# Patient Record
Sex: Female | Born: 1940 | Race: White | Hispanic: No | Marital: Married | Smoking: Never smoker
Health system: Southern US, Community
[De-identification: ages and names within clinical notes are randomized; demographics above are authoritative.]

## PROBLEM LIST (undated history)

## (undated) DIAGNOSIS — N3020 Other chronic cystitis without hematuria: Secondary | ICD-10-CM

## (undated) DIAGNOSIS — G3184 Mild cognitive impairment, so stated: Secondary | ICD-10-CM

## (undated) DIAGNOSIS — E785 Hyperlipidemia, unspecified: Secondary | ICD-10-CM

## (undated) DIAGNOSIS — D649 Anemia, unspecified: Secondary | ICD-10-CM

## (undated) DIAGNOSIS — K635 Polyp of colon: Secondary | ICD-10-CM

## (undated) DIAGNOSIS — I10 Essential (primary) hypertension: Secondary | ICD-10-CM

## (undated) DIAGNOSIS — G459 Transient cerebral ischemic attack, unspecified: Secondary | ICD-10-CM

## (undated) DIAGNOSIS — J309 Allergic rhinitis, unspecified: Secondary | ICD-10-CM

## (undated) DIAGNOSIS — M1990 Unspecified osteoarthritis, unspecified site: Secondary | ICD-10-CM

## (undated) DIAGNOSIS — M8580 Other specified disorders of bone density and structure, unspecified site: Secondary | ICD-10-CM

## (undated) DIAGNOSIS — F0390 Unspecified dementia without behavioral disturbance: Secondary | ICD-10-CM

## (undated) DIAGNOSIS — N390 Urinary tract infection, site not specified: Secondary | ICD-10-CM

## (undated) DIAGNOSIS — K219 Gastro-esophageal reflux disease without esophagitis: Secondary | ICD-10-CM

## (undated) DIAGNOSIS — M47812 Spondylosis without myelopathy or radiculopathy, cervical region: Secondary | ICD-10-CM

## (undated) DIAGNOSIS — G5601 Carpal tunnel syndrome, right upper limb: Secondary | ICD-10-CM

## (undated) DIAGNOSIS — F319 Bipolar disorder, unspecified: Secondary | ICD-10-CM

## (undated) HISTORY — PX: POLYPECTOMY: SHX149

## (undated) HISTORY — PX: BREAST BIOPSY: SHX20

## (undated) HISTORY — PX: BLADDER SUSPENSION: SHX72

## (undated) HISTORY — DX: Allergic rhinitis, unspecified: J30.9

## (undated) HISTORY — DX: Hyperlipidemia, unspecified: E78.5

## (undated) HISTORY — DX: Gastro-esophageal reflux disease without esophagitis: K21.9

## (undated) HISTORY — DX: Anemia, unspecified: D64.9

## (undated) HISTORY — PX: CHOLECYSTECTOMY: SHX55

## (undated) HISTORY — DX: Bipolar disorder, unspecified: F31.9

## (undated) HISTORY — PX: BUNIONECTOMY: SHX129

## (undated) HISTORY — DX: Transient cerebral ischemic attack, unspecified: G45.9

## (undated) HISTORY — PX: COLONOSCOPY: SHX174

## (undated) HISTORY — DX: Urinary tract infection, site not specified: N39.0

## (undated) HISTORY — PX: OTHER SURGICAL HISTORY: SHX169

## (undated) HISTORY — DX: Essential (primary) hypertension: I10

## (undated) HISTORY — DX: Unspecified osteoarthritis, unspecified site: M19.90

## (undated) HISTORY — DX: Other chronic cystitis without hematuria: N30.20

## (undated) HISTORY — DX: Carpal tunnel syndrome, right upper limb: G56.01

## (undated) HISTORY — DX: Spondylosis without myelopathy or radiculopathy, cervical region: M47.812

## (undated) HISTORY — DX: Other specified disorders of bone density and structure, unspecified site: M85.80

## (undated) HISTORY — DX: Mild cognitive impairment, so stated: G31.84

## (undated) HISTORY — PX: TOTAL ABDOMINAL HYSTERECTOMY: SHX209

## (undated) HISTORY — PX: TONSILLECTOMY: SUR1361

## (undated) HISTORY — PX: TRIGGER FINGER RELEASE: SHX641

## (undated) HISTORY — PX: BREAST LUMPECTOMY: SHX2

## (undated) HISTORY — DX: Polyp of colon: K63.5

---

## 2011-04-24 ENCOUNTER — Emergency Department (HOSPITAL_COMMUNITY)
Admission: EM | Admit: 2011-04-24 | Discharge: 2011-04-24 | Disposition: A | Discharge status: Home / Self Care | Payer: Medicare Other | Attending: Emergency Medicine | Admitting: Emergency Medicine

## 2011-04-24 ENCOUNTER — Emergency Department (HOSPITAL_COMMUNITY): Payer: Medicare Other

## 2011-04-24 DIAGNOSIS — R059 Cough, unspecified: Secondary | ICD-10-CM | POA: Insufficient documentation

## 2011-04-24 DIAGNOSIS — F319 Bipolar disorder, unspecified: Secondary | ICD-10-CM | POA: Insufficient documentation

## 2011-04-24 DIAGNOSIS — R05 Cough: Secondary | ICD-10-CM | POA: Insufficient documentation

## 2011-04-24 DIAGNOSIS — I509 Heart failure, unspecified: Secondary | ICD-10-CM | POA: Insufficient documentation

## 2011-04-24 DIAGNOSIS — J45909 Unspecified asthma, uncomplicated: Secondary | ICD-10-CM | POA: Insufficient documentation

## 2011-04-24 DIAGNOSIS — I10 Essential (primary) hypertension: Secondary | ICD-10-CM | POA: Insufficient documentation

## 2011-04-24 DIAGNOSIS — J40 Bronchitis, not specified as acute or chronic: Secondary | ICD-10-CM | POA: Insufficient documentation

## 2011-04-27 ENCOUNTER — Ambulatory Visit (INDEPENDENT_AMBULATORY_CARE_PROVIDER_SITE_OTHER): Payer: Medicare Other | Admitting: Internal Medicine

## 2011-04-27 ENCOUNTER — Other Ambulatory Visit (INDEPENDENT_AMBULATORY_CARE_PROVIDER_SITE_OTHER): Payer: Medicare Other

## 2011-04-27 ENCOUNTER — Encounter: Payer: Self-pay | Admitting: Internal Medicine

## 2011-04-27 ENCOUNTER — Other Ambulatory Visit (INDEPENDENT_AMBULATORY_CARE_PROVIDER_SITE_OTHER): Payer: Medicare Other | Admitting: Internal Medicine

## 2011-04-27 VITALS — BP 122/70 | HR 71 | Temp 98.8°F | Ht <= 58 in | Wt 136.5 lb

## 2011-04-27 DIAGNOSIS — K219 Gastro-esophageal reflux disease without esophagitis: Secondary | ICD-10-CM

## 2011-04-27 DIAGNOSIS — E785 Hyperlipidemia, unspecified: Secondary | ICD-10-CM

## 2011-04-27 DIAGNOSIS — R5381 Other malaise: Secondary | ICD-10-CM

## 2011-04-27 DIAGNOSIS — N390 Urinary tract infection, site not specified: Secondary | ICD-10-CM

## 2011-04-27 DIAGNOSIS — J42 Unspecified chronic bronchitis: Secondary | ICD-10-CM | POA: Insufficient documentation

## 2011-04-27 DIAGNOSIS — Z13220 Encounter for screening for lipoid disorders: Secondary | ICD-10-CM

## 2011-04-27 DIAGNOSIS — R5383 Other fatigue: Secondary | ICD-10-CM

## 2011-04-27 DIAGNOSIS — J309 Allergic rhinitis, unspecified: Secondary | ICD-10-CM | POA: Insufficient documentation

## 2011-04-27 DIAGNOSIS — M1990 Unspecified osteoarthritis, unspecified site: Secondary | ICD-10-CM | POA: Insufficient documentation

## 2011-04-27 DIAGNOSIS — K635 Polyp of colon: Secondary | ICD-10-CM

## 2011-04-27 DIAGNOSIS — Z0000 Encounter for general adult medical examination without abnormal findings: Secondary | ICD-10-CM | POA: Insufficient documentation

## 2011-04-27 DIAGNOSIS — N3020 Other chronic cystitis without hematuria: Secondary | ICD-10-CM | POA: Insufficient documentation

## 2011-04-27 DIAGNOSIS — Z90710 Acquired absence of both cervix and uterus: Secondary | ICD-10-CM | POA: Insufficient documentation

## 2011-04-27 DIAGNOSIS — G459 Transient cerebral ischemic attack, unspecified: Secondary | ICD-10-CM

## 2011-04-27 DIAGNOSIS — D126 Benign neoplasm of colon, unspecified: Secondary | ICD-10-CM

## 2011-04-27 DIAGNOSIS — J4520 Mild intermittent asthma, uncomplicated: Secondary | ICD-10-CM | POA: Insufficient documentation

## 2011-04-27 DIAGNOSIS — R32 Unspecified urinary incontinence: Secondary | ICD-10-CM | POA: Insufficient documentation

## 2011-04-27 DIAGNOSIS — F319 Bipolar disorder, unspecified: Secondary | ICD-10-CM

## 2011-04-27 DIAGNOSIS — I10 Essential (primary) hypertension: Secondary | ICD-10-CM

## 2011-04-27 DIAGNOSIS — D649 Anemia, unspecified: Secondary | ICD-10-CM

## 2011-04-27 HISTORY — DX: Gastro-esophageal reflux disease without esophagitis: K21.9

## 2011-04-27 HISTORY — DX: Unspecified osteoarthritis, unspecified site: M19.90

## 2011-04-27 HISTORY — DX: Other chronic cystitis without hematuria: N30.20

## 2011-04-27 HISTORY — DX: Allergic rhinitis, unspecified: J30.9

## 2011-04-27 HISTORY — DX: Urinary tract infection, site not specified: N39.0

## 2011-04-27 HISTORY — DX: Bipolar disorder, unspecified: F31.9

## 2011-04-27 HISTORY — DX: Transient cerebral ischemic attack, unspecified: G45.9

## 2011-04-27 HISTORY — DX: Hyperlipidemia, unspecified: E78.5

## 2011-04-27 HISTORY — DX: Essential (primary) hypertension: I10

## 2011-04-27 HISTORY — DX: Anemia, unspecified: D64.9

## 2011-04-27 HISTORY — DX: Polyp of colon: K63.5

## 2011-04-27 LAB — HEPATIC FUNCTION PANEL
AST: 18 U/L (ref 0–37)
Albumin: 3.8 g/dL (ref 3.5–5.2)
Alkaline Phosphatase: 59 U/L (ref 39–117)
Bilirubin, Direct: 0.1 mg/dL (ref 0.0–0.3)

## 2011-04-27 LAB — BASIC METABOLIC PANEL
CO2: 27 mEq/L (ref 19–32)
GFR: 72.95 mL/min (ref >60.00)
Glucose, Bld: 91 mg/dL (ref 70–99)
Potassium: 4.2 mEq/L (ref 3.5–5.1)
Sodium: 139 mEq/L (ref 135–145)

## 2011-04-27 LAB — CBC WITH DIFFERENTIAL/PLATELET
Basophils Absolute: 0 10*3/uL (ref 0.0–0.1)
Eosinophils Absolute: 0.2 10*3/uL (ref 0.0–0.7)
HCT: 38.6 % (ref 36.0–46.0)
Lymphs Abs: 2.8 10*3/uL (ref 0.7–4.0)
MCHC: 33.5 g/dL (ref 30.0–36.0)
Monocytes Absolute: 0.6 10*3/uL (ref 0.1–1.0)
Monocytes Relative: 6.7 % (ref 3.0–12.0)
Platelets: 219 10*3/uL (ref 150.0–400.0)
RDW: 13.1 % (ref 11.5–14.6)

## 2011-04-27 LAB — LIPID PANEL
Total CHOL/HDL Ratio: 3
Triglycerides: 263 mg/dL — ABNORMAL HIGH (ref 0.0–149.0)

## 2011-04-27 LAB — LDL CHOLESTEROL, DIRECT: Direct LDL: 99 mg/dL

## 2011-04-27 LAB — TSH: TSH: 1.69 u[IU]/mL (ref 0.35–5.50)

## 2011-04-27 NOTE — Assessment & Plan Note (Signed)
Due for f/u colonscopy - will refer

## 2011-04-27 NOTE — Patient Instructions (Signed)
Continue all other medications as before You will be contacted regarding the referral for: colonoscopy Please call [facility] on [address] for your routine Screening Mammogram Please call if you need refills in the future Please go to LAB in the Basement for the blood and/or urine tests to be done today Please call the phone number [phone] (the PhoneTree System) for results of testing in 2-3 days;  When calling, simply dial the number, and when prompted enter the MRN number above (the Medical Record Number) and the # key, then the message should start. Please return in 1 year for your yearly visit, or sooner if needed

## 2011-04-27 NOTE — Progress Notes (Signed)
Subjective:    Patient ID: Madeline Mcdaniel, female    DOB: 02-18-1941, 70 y.o.   MRN: [id number]  HPI  Here to establish as new pt, just seen in  ER may 22 , dx and tx for bronchitis, cxr neg, still coughing "when I get hot"  And to lie down, overall less prod, no blood, Here for wellness and f/u;  Overall doing ok;  Pt denies CP, worsening SOB, DOE, wheezing, orthopnea, PND, worsening LE edema, palpitations, dizziness or syncope.  Pt denies neurological change such as new Headache, facial or extremity weakness.  Pt denies polydipsia, polyuria, or low sugar symptoms. Pt states overall good compliance with treatment and medications, good tolerability, and trying to follow lower cholesterol diet.  Pt denies worsening depressive symptoms, suicidal ideation or panic. No fever, wt loss, night sweats, loss of appetite, or other constitutional symptoms.  Pt states good ability with ADL's, low fall risk, home safety reviewed and adequate, no significant changes in hearing or vision, and occasionally active with exercise.   Does have sense of ongoing fatigue, but denies signficant hypersomnolence.  Past Medical History  Diagnosis Date  . Asthma 04/27/2011  . Degenerative joint disease 04/27/2011  . Bipolar affective disorder 04/27/2011  . Chronic bronchitis 04/27/2011  . GERD (gastroesophageal reflux disease) 04/27/2011  . Allergic rhinitis, cause unspecified 04/27/2011  . HTN (hypertension) 04/27/2011  . Hyperlipemia 04/27/2011  . Chronic cystitis 04/27/2011  . Colon polyps 04/27/2011  . Anemia 04/27/2011  . H/O: hysterectomy 04/27/2011  . TIA (transient ischemic attack) 04/27/2011  . Urinary incontinence 04/27/2011  . 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 Hyperlipidemia 04/27/2011   No past surgical history on file.  reports that she has quit smoking. She does not have any smokeless tobacco history on file. She reports that she does not drink alcohol or use illicit drugs. family history is not on file. Allergies  Allergen  Reactions  . Vioxx (Rofecoxib)   Oliver Barre No current outpatient prescriptions on file prior to visit.   Review of Systems Review of Systems  Constitutional: Negative for diaphoresis, activity change, appetite change and unexpected weight change.  HENT: Negative for hearing loss, ear pain, facial swelling, mouth sores and neck stiffness.   Eyes: Negative for pain, redness and visual disturbance.  Respiratory: Negative for shortness of breath and wheezing.   Cardiovascular: Negative for chest pain and palpitations.  Gastrointestinal: Negative for diarrhea, blood in stool, abdominal distention and rectal pain.  Genitourinary: Negative for hematuria, flank pain and decreased urine volume.  Musculoskeletal: Negative for myalgias and joint swelling.  Skin: Negative for color change and wound.  Neurological: Negative for syncope and numbness.  Hematological: Negative for adenopathy.  Psychiatric/Behavioral: Negative for hallucinations, self-injury, decreased concentration and agitation.      Objective:   Physical Exam BP 122/70  Pulse 71  Temp(Src) 98.8 F (37.1 C) (Oral)  Ht 4\' 9"  (1.448 m)  Wt 136 lb 8 oz (61.916 kg)  BMI 29.54 kg/m2  SpO2 96% Physical Exam  VS noted Constitutional: Pt is oriented to person, place, and time. Appears well-developed and well-nourished.  HENT:  Head: Normocephalic and atraumatic.  Right Ear: External ear normal.  Left Ear: External ear normal.  Nose: Nose normal.  Mouth/Throat: Oropharynx is clear and moist.  Eyes: Conjunctivae and EOM are normal. Pupils are equal, round, and reactive to light.  Neck: Normal range of motion. Neck supple. No JVD present. No tracheal deviation present.  Cardiovascular: Normal rate, regular rhythm, normal heart sounds  and intact distal pulses.   Pulmonary/Chest: Effort normal and breath sounds normal.  Abdominal: Soft. Bowel sounds are normal. There is no tenderness.  Musculoskeletal: Normal range of motion.  Exhibits no edema.  Lymphadenopathy:  Has no cervical adenopathy.  Neurological: Pt is alert and oriented to person, place, and time. Pt has normal reflexes. No cranial nerve deficit.  Skin: Skin is warm and dry. No rash noted.  Psychiatric:  Has  normal mood and affect. Behavior is normal.         Assessment & Plan:

## 2011-04-27 NOTE — Assessment & Plan Note (Signed)
.  stable overall by hx and exam, most recent lab reviewed with pt, and pt to continue medical treatment as before , cont diet  For lipid check today

## 2011-04-27 NOTE — Assessment & Plan Note (Signed)
Etiology unclear, Exam otherwise benign, to check labs as documented, follow with expectant management  

## 2011-04-27 NOTE — Assessment & Plan Note (Signed)
stable overall by hx and exam, most recent lab reviewed with pt, and pt to continue medical treatment as before  For lithium level today

## 2011-04-28 NOTE — Progress Notes (Signed)
Quick Note:  Voice message left on PhoneTree system - lab is negative, normal or otherwise stable, pt to continue same tx ______ 

## 2011-05-07 ENCOUNTER — Encounter: Payer: Self-pay | Admitting: Internal Medicine

## 2011-05-18 ENCOUNTER — Ambulatory Visit (AMBULATORY_SURGERY_CENTER): Payer: Medicare Other | Admitting: *Deleted

## 2011-05-18 VITALS — Ht <= 58 in | Wt 135.5 lb

## 2011-05-18 DIAGNOSIS — Z1211 Encounter for screening for malignant neoplasm of colon: Secondary | ICD-10-CM

## 2011-05-18 MED ORDER — PEG-KCL-NACL-NASULF-NA ASC-C 100 G PO SOLR: ORAL | Status: DC

## 2011-05-18 NOTE — Progress Notes (Signed)
PATIENT HAD PREVISIT TODAY FOR DIRECT SCREENING COLONOSCOPY. SHE STATES LAST COLONOSCOPY WAS 11YRS AGO IN [place],PT UNSURE OF DOCTORS NAME. SHE SAID DR REMOVED 2 SMALL BENIGN POLYPS AND WANT HER TO REPEAT THIS IN 10 YRS. SHE DENIES ANT SYMPTOMS OR PROBLEMS WITH COLON. SHE DOES C/O ACID REFLUX,EXPLAINING WHAT HAPPEN TO HER ABOUT MONTH AGO WITH REFLUX SYMPTOMS AT NIGHT AND SHE WAS UNABLE TO SLEEP. WHEN I ASKED HER SHE SAID SHE DID TELL HER PRIMARY MD,DR.JOHN ABOUT THIS. EXPLAINED TO HER THAT WE WOULD NEED TO MAKE HER A DOCTOR VISIT TO SPEAK WITH DR.PERRY ABOUT REFLUX AND THEN HE MAY DECIDE SHE WOULD NEED UPPER ENDO. SHE UNDERSTANDS BUT STATES SHE WOULD LIKE TO GET COLONOSCOPY OVER AND THEN SHE WOULD CALL TO MAKE THAT OFFICE VISIT. Madeline Mcdaniel

## 2011-05-18 NOTE — Progress Notes (Signed)
PATIENT STATES SHE IS HARD TO WAKE UP FROM ANESTHESIA, BUT STATES WHEN SHE TELLS ANESTHESIOLOGIST THIS BEFORE TIME,SURGERY GOES WELL.  PATIENT STATES SHE DOES NOT REMEMBER HAVING ANY PROBLEMS FROM LAST COLONOSCOPY 10 YRS AGO.

## 2011-06-01 ENCOUNTER — Ambulatory Visit (AMBULATORY_SURGERY_CENTER): Payer: Medicare Other | Admitting: Internal Medicine

## 2011-06-01 ENCOUNTER — Encounter: Payer: Self-pay | Admitting: Internal Medicine

## 2011-06-01 VITALS — BP 133/61 | HR 62 | Temp 97.7°F | Resp 21 | Ht <= 58 in | Wt 136.0 lb

## 2011-06-01 DIAGNOSIS — Z86010 Personal history of colonic polyps: Secondary | ICD-10-CM

## 2011-06-01 DIAGNOSIS — D126 Benign neoplasm of colon, unspecified: Secondary | ICD-10-CM

## 2011-06-01 DIAGNOSIS — K620 Anal polyp: Secondary | ICD-10-CM

## 2011-06-01 DIAGNOSIS — Z1211 Encounter for screening for malignant neoplasm of colon: Secondary | ICD-10-CM

## 2011-06-01 DIAGNOSIS — K648 Other hemorrhoids: Secondary | ICD-10-CM

## 2011-06-01 MED ORDER — SODIUM CHLORIDE 0.9 % IV SOLN: 500.0000 mL | INTRAVENOUS | Status: DC

## 2011-06-01 NOTE — Patient Instructions (Signed)
DISCHARGE INSTRUCTIONS GIVEN WITH VERBAL UNDERSTANDING. HANDOUT ON POLYPS AND DIVERTICULOSIS, HEMORRHOIDS GIVEN. RESUME PREVIOUS MEDICATIONS.

## 2011-06-01 NOTE — Progress Notes (Signed)
Pt tolerated the colonoscopy very well. MAW

## 2011-06-04 ENCOUNTER — Telehealth: Payer: Self-pay

## 2011-06-04 NOTE — Telephone Encounter (Signed)

## 2011-06-13 ENCOUNTER — Other Ambulatory Visit: Payer: Self-pay | Admitting: Internal Medicine

## 2011-06-13 DIAGNOSIS — Z1231 Encounter for screening mammogram for malignant neoplasm of breast: Secondary | ICD-10-CM

## 2011-07-05 ENCOUNTER — Ambulatory Visit
Admission: RE | Admit: 2011-07-05 | Discharge: 2011-07-05 | Disposition: A | Discharge status: Home / Self Care | Payer: Medicare Other | Source: Ambulatory Visit | Attending: Internal Medicine | Admitting: Internal Medicine

## 2011-07-05 DIAGNOSIS — Z1231 Encounter for screening mammogram for malignant neoplasm of breast: Secondary | ICD-10-CM

## 2011-07-09 ENCOUNTER — Other Ambulatory Visit: Payer: Self-pay | Admitting: Internal Medicine

## 2011-07-09 DIAGNOSIS — R928 Other abnormal and inconclusive findings on diagnostic imaging of breast: Secondary | ICD-10-CM

## 2011-07-13 ENCOUNTER — Ambulatory Visit
Admission: RE | Admit: 2011-07-13 | Discharge: 2011-07-13 | Disposition: A | Discharge status: Home / Self Care | Payer: Medicare Other | Source: Ambulatory Visit | Attending: Internal Medicine | Admitting: Internal Medicine

## 2011-07-13 DIAGNOSIS — R928 Other abnormal and inconclusive findings on diagnostic imaging of breast: Secondary | ICD-10-CM

## 2011-08-30 ENCOUNTER — Ambulatory Visit (INDEPENDENT_AMBULATORY_CARE_PROVIDER_SITE_OTHER): Payer: Medicare Other | Admitting: *Deleted

## 2011-08-30 DIAGNOSIS — Z23 Encounter for immunization: Secondary | ICD-10-CM

## 2011-09-04 ENCOUNTER — Other Ambulatory Visit: Payer: Self-pay | Admitting: Internal Medicine

## 2011-09-04 NOTE — Telephone Encounter (Signed)
Ok for all 3 as per routine  To robin to handle

## 2012-01-23 ENCOUNTER — Ambulatory Visit (INDEPENDENT_AMBULATORY_CARE_PROVIDER_SITE_OTHER): Payer: Medicare Other | Admitting: Internal Medicine

## 2012-01-23 ENCOUNTER — Ambulatory Visit (INDEPENDENT_AMBULATORY_CARE_PROVIDER_SITE_OTHER)
Admission: RE | Admit: 2012-01-23 | Discharge: 2012-01-23 | Disposition: A | Discharge status: Home / Self Care | Payer: Medicare Other | Source: Ambulatory Visit | Attending: Internal Medicine | Admitting: Internal Medicine

## 2012-01-23 ENCOUNTER — Encounter: Payer: Self-pay | Admitting: Internal Medicine

## 2012-01-23 VITALS — BP 120/82 | HR 68 | Temp 97.0°F | Ht <= 58 in | Wt 135.4 lb

## 2012-01-23 DIAGNOSIS — M79672 Pain in left foot: Secondary | ICD-10-CM

## 2012-01-23 DIAGNOSIS — I10 Essential (primary) hypertension: Secondary | ICD-10-CM

## 2012-01-23 DIAGNOSIS — M79676 Pain in unspecified toe(s): Secondary | ICD-10-CM

## 2012-01-23 DIAGNOSIS — M79609 Pain in unspecified limb: Secondary | ICD-10-CM

## 2012-01-23 NOTE — Assessment & Plan Note (Signed)
stable overall by hx and exam, most recent data reviewed with pt, and pt to continue medical treatment as before ble BP Readings from Last 3 Encounters:  01/23/12 120/82  06/01/11 133/61  04/27/11 122/70

## 2012-01-23 NOTE — Progress Notes (Signed)
Subjective:    Patient ID: Madeline Mcdaniel, female    DOB: Jul 21, 1941, 71 y.o.   MRN: [id number]  HPI here as acute walkin;  Night before last was up to BR without the light on, struck the left foot to the heavy bedpost with immed severe pain that persists, limps to walk and now with right lower back ache from that;  Also with marked sweling and bruising involving the distal and lateral foot , not on blood thinner.  No other falls or injury, no fever, redness.   Past Medical History  Diagnosis Date  . Asthma 04/27/2011  . Degenerative joint disease 04/27/2011  . Bipolar affective disorder 04/27/2011  . Chronic bronchitis 04/27/2011  . GERD (gastroesophageal reflux disease) 04/27/2011  . Allergic rhinitis, cause unspecified 04/27/2011  . HTN (hypertension) 04/27/2011  . Hyperlipemia 04/27/2011  . Chronic cystitis 04/27/2011  . Colon polyps 04/27/2011  . Anemia 04/27/2011  . H/O: hysterectomy 04/27/2011  . TIA (transient ischemic attack) 04/27/2011  . Urinary incontinence 04/27/2011  . 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 Hyperlipidemia 04/27/2011  . Stroke    Past Surgical History  Procedure Date  . Colonoscopy 76YRS AGO    IN [place],PT DOES NOT REMEMBER DOCTOR  . Orthoscopic carpetomy     BOTH THUMBS  . Trigger finger release     LEFT RING FINGER  . Bunionectomy     LEFT FOOT  . Total abdominal hysterectomy   . Tonsillectomy   . Bladder suspension     A&P REPAIR WITH MESH  . Steriotactic breast biopsy     RIGHT  . Breast lumpectomy     SEVERAL TIMES  . Polypectomy 10 YRS AGO    BENIGN POLYPS X2    reports that she has never smoked. She has never used smokeless tobacco. She reports that she does not drink alcohol or use illicit drugs. family history includes Cancer (age of onset:62) in her mother.  There is no history of Colon cancer. Allergies  Allergen Reactions  . Vioxx (Rofecoxib) Anaphylaxis  . Aspirin   . Ciprofloxacin Other (See Comments)    HEADACHE  . Nsaids     BECAUSE OF REACTION  TO VIOXX   Current Outpatient Prescriptions on File Prior to Visit  Medication Sig Dispense Refill  . acetaminophen (TYLENOL) 500 MG tablet Take 500 mg by mouth as needed.        Marland Kitchen albuterol (PROVENTIL,VENTOLIN) 90 MCG/ACT inhaler Inhale 2 puffs into the lungs every 4 (four) hours as needed.        . Coenzyme Q10 (CO Q 10 PO) Take 200 mg by mouth daily.        . Fluticasone-Salmeterol (ADVAIR DISKUS IN) Inhale 1 Inhaler into the lungs as needed. 100/50       . gabapentin (NEURONTIN) 300 MG capsule Take 300 mg by mouth 2 (two) times daily.        . hydrochlorothiazide (,MICROZIDE/HYDRODIURIL,) 12.5 MG capsule Take 12.5 mg by mouth daily. TAKES 1/2 TAB      . hydrochlorothiazide (HYDRODIURIL) 12.5 MG tablet TAKE 1 TABLET BY MOUTH EVERY DAY  90 tablet  1  . lisinopril (PRINIVIL,ZESTRIL) 10 MG tablet TAKE 1 TABLET BY MOUTH EVERY DAY  90 tablet  1  . lithium carbonate 300 MG capsule TAKE 1 CAPSULE BY MOUTH TWICE DAILY .  180 capsule  1  . nitrofurantoin (MACRODANTIN) 50 MG capsule Take 50 mg by mouth daily.        . Omega-3 Fatty Acids (FISH  OIL) 1200 MG CAPS Take 1 capsule by mouth daily.         Current Facility-Administered Medications on File Prior to Visit  Medication Dose Route Frequency Provider Last Rate Last Dose  . 0.9 %  sodium chloride infusion  500 mL Intravenous Continuous Yancey Flemings, MD       Review of Systems Review of Systems  Constitutional: Negative for diaphoresis and unexpected weight change.  HENT: Negative for drooling and tinnitus.   Eyes: Negative for photophobia and visual disturbance.  Respiratory: Negative for choking and stridor.   Gastrointestinal: Negative for vomiting and blood in stool.  Genitourinary: Negative for hematuria and decreased urine volume.     Objective:   Physical Exam BP 120/82  Pulse 68  Temp(Src) 97 F (36.1 C) (Oral)  Ht 4\' 9"  (1.448 m)  Wt 135 lb 6 oz (61.406 kg)  BMI 29.30 kg/m2  SpO2 98% Physical Exam  VS noted, not ill  appearing Constitutional: Pt appears well-developed and well-nourished.  HENT: Head: Normocephalic.  Right Ear: External ear normal.  Left Ear: External ear normal.  Eyes: Conjunctivae and EOM are normal. Pupils are equal, round, and reactive to light.  Neck: Normal range of motion. Neck supple.  Cardiovascular: Normal rate and regular rhythm.   Pulmonary/Chest: Effort normal and breath sounds normal.  Neurological: Pt is alert. LE motor/dtr intact, gait antalgic Skin: Skin is warm. No erythema. But distal lateral left foot and 4th/5th toes with marked bruising, tender, 1+ swelling;  No ulcer, dorsalis pedis 1+ Psychiatric: Pt behavior is normal. Thought content normal.     Assessment & Plan:

## 2012-01-23 NOTE — Patient Instructions (Addendum)
Continue all other medications as before Please go to XRAY in the Basement for the x-ray test Please call the phone number [phone] (the PhoneTree System) for results of testing in 2-3 days;  When calling, simply dial the number, and when prompted enter the MRN number above (the Medical Record Number) and the # key, then the message should start.  

## 2012-02-22 DIAGNOSIS — L299 Pruritus, unspecified: Secondary | ICD-10-CM | POA: Diagnosis not present

## 2012-02-22 DIAGNOSIS — F319 Bipolar disorder, unspecified: Secondary | ICD-10-CM | POA: Diagnosis not present

## 2012-02-22 DIAGNOSIS — R6883 Chills (without fever): Secondary | ICD-10-CM | POA: Diagnosis not present

## 2012-02-22 DIAGNOSIS — R110 Nausea: Secondary | ICD-10-CM | POA: Diagnosis not present

## 2012-02-22 DIAGNOSIS — Z883 Allergy status to other anti-infective agents status: Secondary | ICD-10-CM | POA: Diagnosis not present

## 2012-02-22 DIAGNOSIS — R51 Headache: Secondary | ICD-10-CM | POA: Diagnosis not present

## 2012-02-22 DIAGNOSIS — Z90710 Acquired absence of both cervix and uterus: Secondary | ICD-10-CM | POA: Diagnosis not present

## 2012-02-22 DIAGNOSIS — R21 Rash and other nonspecific skin eruption: Secondary | ICD-10-CM | POA: Diagnosis not present

## 2012-02-22 DIAGNOSIS — I10 Essential (primary) hypertension: Secondary | ICD-10-CM | POA: Diagnosis not present

## 2012-02-22 DIAGNOSIS — Z79899 Other long term (current) drug therapy: Secondary | ICD-10-CM | POA: Diagnosis not present

## 2012-02-22 DIAGNOSIS — Z886 Allergy status to analgesic agent status: Secondary | ICD-10-CM | POA: Diagnosis not present

## 2012-03-05 ENCOUNTER — Telehealth: Payer: Self-pay

## 2012-03-05 MED ORDER — NITROFURANTOIN MACROCRYSTAL 50 MG PO CAPS: 50.0000 mg | ORAL_CAPSULE | Freq: Every day | ORAL | Status: DC

## 2012-03-05 NOTE — Telephone Encounter (Signed)
Patient was in the ER in [place] for a tick bite and called to inquire if lab results had been received yet? Also she has been on macrodantin for many years and needs a refill as previous urologist informed would always need to take. Please advise [phone]

## 2012-03-05 NOTE — Telephone Encounter (Signed)
I dont recall seeing labs  [facility] for refill - done per emr

## 2012-03-05 NOTE — Telephone Encounter (Signed)
Called informed the patient of rx sent in and she stated she would call the hospital in [facility] back to confirm to send lab results to [facility].

## 2012-04-29 ENCOUNTER — Other Ambulatory Visit (INDEPENDENT_AMBULATORY_CARE_PROVIDER_SITE_OTHER): Payer: Medicare Other

## 2012-04-29 ENCOUNTER — Ambulatory Visit (INDEPENDENT_AMBULATORY_CARE_PROVIDER_SITE_OTHER): Payer: Medicare Other | Admitting: Internal Medicine

## 2012-04-29 ENCOUNTER — Encounter: Payer: Self-pay | Admitting: Internal Medicine

## 2012-04-29 VITALS — BP 120/70 | HR 65 | Temp 97.2°F | Ht <= 58 in | Wt 135.1 lb

## 2012-04-29 DIAGNOSIS — F319 Bipolar disorder, unspecified: Secondary | ICD-10-CM | POA: Diagnosis not present

## 2012-04-29 DIAGNOSIS — E785 Hyperlipidemia, unspecified: Secondary | ICD-10-CM | POA: Diagnosis not present

## 2012-04-29 DIAGNOSIS — I10 Essential (primary) hypertension: Secondary | ICD-10-CM

## 2012-04-29 LAB — CBC WITH DIFFERENTIAL/PLATELET
Basophils Relative: 0.5 % (ref 0.0–3.0)
Eosinophils Relative: 1.3 % (ref 0.0–5.0)
HCT: 39.5 % (ref 36.0–46.0)
Hemoglobin: 13 g/dL (ref 12.0–15.0)
Lymphs Abs: 2.5 10*3/uL (ref 0.7–4.0)
MCV: 89.3 fl (ref 78.0–100.0)
Monocytes Relative: 7 % (ref 3.0–12.0)
Platelets: 175 10*3/uL (ref 150.0–400.0)
RBC: 4.42 Mil/uL (ref 3.87–5.11)
WBC: 7.4 10*3/uL (ref 4.5–10.5)

## 2012-04-29 LAB — HEPATIC FUNCTION PANEL
Albumin: 4 g/dL (ref 3.5–5.2)
Total Bilirubin: 0.9 mg/dL (ref 0.3–1.2)

## 2012-04-29 LAB — BASIC METABOLIC PANEL
BUN: 16 mg/dL (ref 6–23)
Calcium: 9.9 mg/dL (ref 8.4–10.5)
Creatinine, Ser: 0.7 mg/dL (ref 0.4–1.2)
GFR: 81.89 mL/min (ref >60.00)
Glucose, Bld: 92 mg/dL (ref 70–99)

## 2012-04-29 LAB — LIPID PANEL
Cholesterol: 185 mg/dL (ref 0–200)
HDL: 60.6 mg/dL (ref >39.00)
LDL Cholesterol: 98 mg/dL (ref 0–99)
VLDL: 26.6 mg/dL (ref 0.0–40.0)

## 2012-04-29 LAB — TSH: TSH: 2.28 u[IU]/mL (ref 0.35–5.50)

## 2012-04-29 NOTE — Progress Notes (Signed)
Subjective:    Patient ID: Madeline Mcdaniel, female    DOB: Nov 16, 1941, 71 y.o.   MRN: [id number]  HPI Here to f/u;  Toe pain/fx resolved from feb 2013, back to walking 3 miles daily;  Planning to leave for [place] in early June for 1 month, with snorkling, ballon ride and hiking;  Here for f/u;  Overall doing ok;  Pt denies CP, worsening SOB, DOE, wheezing, orthopnea, PND, worsening LE edema, palpitations, dizziness or syncope.  Pt denies neurological change such as new Headache, facial or extremity weakness.  Pt denies polydipsia, polyuria, or low sugar symptoms. Pt states overall good compliance with treatment and medications, good tolerability, and trying to follow lower cholesterol diet.  Pt denies worsening depressive symptoms, suicidal ideation or panic. No fever, wt loss, night sweats, loss of appetite, or other constitutional symptoms.  Pt states good ability with ADL's, low fall risk, home safety reviewed and adequate, no significant changes in hearing or vision, and occasionally active with exercise. No complaints today.  Due for labs, has ok med refills.  Does get yearly mammogram.   Thinks she had the zostavax last yr in [place] but not sure. Past Medical History  Diagnosis Date  . Asthma 04/27/2011  . Degenerative joint disease 04/27/2011  . Bipolar affective disorder 04/27/2011  . Chronic bronchitis 04/27/2011  . GERD (gastroesophageal reflux disease) 04/27/2011  . Allergic rhinitis, cause unspecified 04/27/2011  . HTN (hypertension) 04/27/2011  . Hyperlipemia 04/27/2011  . Chronic cystitis 04/27/2011  . Colon polyps 04/27/2011  . Anemia 04/27/2011  . H/O: hysterectomy 04/27/2011  . TIA (transient ischemic attack) 04/27/2011  . Urinary incontinence 04/27/2011  . 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 Hyperlipidemia 04/27/2011  . Stroke    Past Surgical History  Procedure Date  . Colonoscopy 62YRS AGO    IN [place],PT DOES NOT REMEMBER DOCTOR  . Orthoscopic carpetomy     BOTH THUMBS  . Trigger finger  release     LEFT RING FINGER  . Bunionectomy     LEFT FOOT  . Total abdominal hysterectomy   . Tonsillectomy   . Bladder suspension     A&P REPAIR WITH MESH  . Steriotactic breast biopsy     RIGHT  . Breast lumpectomy     SEVERAL TIMES  . Polypectomy 10 YRS AGO    BENIGN POLYPS X2    reports that she has never smoked. She has never used smokeless tobacco. She reports that she does not drink alcohol or use illicit drugs. family history includes Cancer (age of onset:62) in her mother.  There is no history of Colon cancer. Allergies  Allergen Reactions  . Vioxx (Rofecoxib) Anaphylaxis  . Aspirin   . Ciprofloxacin Other (See Comments)    HEADACHE  . Nsaids     BECAUSE OF REACTION TO VIOXX   \ Current Outpatient Prescriptions on File Prior to Visit  Medication Sig Dispense Refill  . acetaminophen (TYLENOL) 500 MG tablet Take 500 mg by mouth as needed.        Marland Kitchen albuterol (PROVENTIL,VENTOLIN) 90 MCG/ACT inhaler Inhale 2 puffs into the lungs every 4 (four) hours as needed.        . Coenzyme Q10 (CO Q 10 PO) Take 200 mg by mouth daily.        . Fluticasone-Salmeterol (ADVAIR DISKUS IN) Inhale 1 Inhaler into the lungs as needed. 100/50       . gabapentin (NEURONTIN) 300 MG capsule Take 300 mg by mouth 2 (two) times daily.        Marland Kitchen  hydrochlorothiazide (,MICROZIDE/HYDRODIURIL,) 12.5 MG capsule Take 12.5 mg by mouth daily. TAKES 1/2 TAB      . hydrochlorothiazide (HYDRODIURIL) 12.5 MG tablet TAKE 1 TABLET BY MOUTH EVERY DAY  90 tablet  1  . lisinopril (PRINIVIL,ZESTRIL) 10 MG tablet TAKE 1 TABLET BY MOUTH EVERY DAY  90 tablet  1  . lithium carbonate 300 MG capsule TAKE 1 CAPSULE BY MOUTH TWICE DAILY .  180 capsule  1  . nitrofurantoin (MACRODANTIN) 50 MG capsule Take 1 capsule (50 mg total) by mouth daily.  30 capsule  5  . Omega-3 Fatty Acids (FISH OIL) 1200 MG CAPS Take 1 capsule by mouth daily.         Current Facility-Administered Medications on File Prior to Visit  Medication Dose  Route Frequency Provider Last Rate Last Dose  . 0.9 %  sodium chloride infusion  500 mL Intravenous Continuous Hilarie Fredrickson, MD       Review of Systems Review of Systems  Constitutional: Negative for diaphoresis and unexpected weight change.  HENT: Negative for drooling and tinnitus.   Eyes: Negative for photophobia and visual disturbance.  Respiratory: Negative for choking and stridor.   Gastrointestinal: Negative for vomiting and blood in stool.  Genitourinary: Negative for hematuria and decreased urine volume.  Musculoskeletal: Negative for gait problem.  Skin: Negative for color change and wound.  Neurological: Negative for tremors and numbness.  Psychiatric/Behavioral: Negative for decreased concentration. The patient is not hyperactive.       Objective:   Physical Exam BP 120/70  Pulse 65  Temp(Src) 97.2 F (36.2 C) (Oral)  Ht 4\' 9"  (1.448 m)  Wt 135 lb 2 oz (61.292 kg)  BMI 29.24 kg/m2  SpO2 97% Physical Exam  VS noted Constitutional: Pt appears well-developed and well-nourished.  HENT: Head: Normocephalic.  Right Ear: External ear normal.  Left Ear: External ear normal.  Eyes: Conjunctivae and EOM are normal. Pupils are equal, round, and reactive to light.  Neck: Normal range of motion. Neck supple.  Cardiovascular: Normal rate and regular rhythm.   Pulmonary/Chest: Effort normal and breath sounds normal.  Abd:  Soft, NT, non-distended, + BS Neurological: Pt is alert. No cranial nerve deficit.  Skin: Skin is warm. No erythema.  Psychiatric: Pt behavior is normal. Thought content normal. slight nervous only, not depressed affect    Assessment & Plan:

## 2012-04-29 NOTE — Patient Instructions (Addendum)
Please remember to followup with your GYN for the yearly mammogram Continue all other medications as before Please have the pharmacy call with any refills you may need. Please go to LAB in the Basement for the blood and/or urine tests to be done today You will be contacted by phone if any changes need to be made immediately.  Otherwise, you will receive a letter about your results with an explanation. Please return in 1 year for your yearly visit, or sooner if needed

## 2012-04-29 NOTE — Assessment & Plan Note (Signed)
stable overall by hx and exam, most recent data reviewed with pt, and pt to continue medical treatment as before; LDL 99   - may 2012

## 2012-04-29 NOTE — Assessment & Plan Note (Signed)
stable overall by hx and exam, most recent data reviewed with pt, and pt to continue medical treatment as before BP Readings from Last 3 Encounters:  04/29/12 120/70  01/23/12 120/82  06/01/11 133/61   For labs today, to f/u any worsening symptoms or concerns

## 2012-04-29 NOTE — Assessment & Plan Note (Signed)
stable overall by hx and exam, most recent data reviewed with pt, and pt to continue medical treatment as before Lithium level .92  - may 2012, for f/u now

## 2012-04-30 ENCOUNTER — Encounter: Payer: Self-pay | Admitting: Internal Medicine

## 2012-04-30 LAB — URINALYSIS, ROUTINE W REFLEX MICROSCOPIC
Ketones, ur: NEGATIVE
Nitrite: NEGATIVE
Specific Gravity, Urine: 1.01 (ref 1.000–1.030)
Total Protein, Urine: NEGATIVE
pH: 7 (ref 5.0–8.0)

## 2012-04-30 LAB — LITHIUM LEVEL: Lithium Lvl: 1 mEq/L (ref 0.80–1.40)

## 2012-05-30 ENCOUNTER — Other Ambulatory Visit: Payer: Self-pay | Admitting: Internal Medicine

## 2012-05-30 NOTE — Telephone Encounter (Signed)
Done erx 

## 2012-06-01 ENCOUNTER — Encounter: Payer: Self-pay | Admitting: Internal Medicine

## 2012-06-01 DIAGNOSIS — M25512 Pain in left shoulder: Secondary | ICD-10-CM | POA: Insufficient documentation

## 2012-06-01 DIAGNOSIS — M47812 Spondylosis without myelopathy or radiculopathy, cervical region: Secondary | ICD-10-CM | POA: Insufficient documentation

## 2012-06-01 DIAGNOSIS — M8580 Other specified disorders of bone density and structure, unspecified site: Secondary | ICD-10-CM

## 2012-06-01 HISTORY — DX: Other specified disorders of bone density and structure, unspecified site: M85.80

## 2012-06-01 HISTORY — DX: Spondylosis without myelopathy or radiculopathy, cervical region: M47.812

## 2012-07-01 ENCOUNTER — Ambulatory Visit (INDEPENDENT_AMBULATORY_CARE_PROVIDER_SITE_OTHER): Payer: Medicare Other | Admitting: Internal Medicine

## 2012-07-01 ENCOUNTER — Ambulatory Visit (INDEPENDENT_AMBULATORY_CARE_PROVIDER_SITE_OTHER)
Admission: RE | Admit: 2012-07-01 | Discharge: 2012-07-01 | Disposition: A | Discharge status: Home / Self Care | Payer: Medicare Other | Source: Ambulatory Visit | Attending: Internal Medicine | Admitting: Internal Medicine

## 2012-07-01 ENCOUNTER — Encounter: Payer: Self-pay | Admitting: Internal Medicine

## 2012-07-01 VITALS — BP 134/70 | HR 63 | Temp 97.1°F | Resp 16 | Wt 134.0 lb

## 2012-07-01 DIAGNOSIS — M5412 Radiculopathy, cervical region: Secondary | ICD-10-CM | POA: Insufficient documentation

## 2012-07-01 DIAGNOSIS — M542 Cervicalgia: Secondary | ICD-10-CM

## 2012-07-01 DIAGNOSIS — M6530 Trigger finger, unspecified finger: Secondary | ICD-10-CM | POA: Diagnosis not present

## 2012-07-01 DIAGNOSIS — M47812 Spondylosis without myelopathy or radiculopathy, cervical region: Secondary | ICD-10-CM | POA: Diagnosis not present

## 2012-07-01 NOTE — Patient Instructions (Signed)
Herniated Disk The bones of your spinal column (vertebrae) protect your spinal cord and nerves that go into your arms and legs. The vertebrae are separated by disks that cushion the spinal column and put space between your vertebrae. This allows movement between the vertebrae, which allows you to bend, rotate, and move your body from side to side. Sometimes, the disks move out of place (herniate) or break open (rupture) from injury or strain. The most common area for a disk herniation is in the lower back (lumbar area). Sometimes herniation occurs in the neck (cervical) disks.  CAUSES  As we grow older, the strong, fibrous cords that connect the vertebrae and support and surround the disks (ligaments) start to weaken. A strain on the back may cause a break in the disk ligaments. RISK FACTORS Herniated disks occur most often in men who are aged 18 years to 35 years, usually after strenuous activity. Other risk factors include conditions present at birth (congenital) that affect the size of the lumbar spinal canal. Additionally, a narrowing of the areas where the nerves exit the spinal canal can occur as you age. SYMPTOMS  Symptoms of a herniated disk vary. You may have weakness in certain muscles. This weakness can include difficulty lifting your leg or arm, difficulty standing on your toes on one side, or difficulty squeezing tightly with one of your hands. You may have numbness. You may feel a mild tingling, dull ache, or a burning or pulsating pain. In some cases, the pain is severe enough that you are unable to move. The pain most often occurs on one side of the body. The pain often starts slowly. It may get worse:  After you sit or stand.   At night.   When you sneeze, cough, or laugh.   When you bend backwards or walk more than a few yards.  The pain, numbness, or weakness will often go away or improve a lot over a period of weeks to months. Herniated lumbar disk Symptoms of a herniated  lumbar disk may include sharp pain in one part of your leg, hip, or buttocks and numbness in other parts. You also may feel pain or numbness on the back of your calf or the top or sole of your foot. The same leg also may feel weak. Herniated cervical disk Symptoms of a herniated cervical disk may include pain when you move your neck, deep pain near or over your shoulder blade, or pain that moves to your upper arm, forearm, or fingers. DIAGNOSIS  To diagnose a herniated disk, your caregiver will perform a physical exam. Your caregiver also may perform diagnostic tests to see your disk or to test the reaction of your muscles and the function of your nerves. During the physical exam, your caregiver may ask you to:  Sit, stand, and walk. While you walk, your caregiver may ask you to try walking on your toes and then your heels.   Bend forward, backward, and sideways.   Raise your shoulders, elbow, wrist, and fingers and check your strength during these tasks.  Your caregiver will check for:  Numbness or loss of feeling.   Muscle reflexes, which may be slower or missing.   Muscle strength, which may be weaker.   Posture or the way your spine curves.  Diagnostic tests that may be done include:  A spinal X-ray exam to rule out other causes of back pain.   Magnetic resonance imaging (MRI) or computed tomography (CT) scan, which will show  if the herniated disk is pressing on your spinal canal.   Electromyography. This is sometimes used to identify the specific area of nerve involvement.  TREATMENT  Initial treatment for a herniated disk is a short period of rest with medicines for pain. Pain medicines can include nonsteroidal anti-inflammatory medicines (NSAIDs), muscle relaxants for back spasms, and (rarely) narcotic pain medicine for severe pain that does not respond to NSAID use. Bed rest is often limited to 1 or 2 days at the most because prolonged rest can delay recovery. When the herniation  involves the lower back, sitting should be avoided as much as possible because sitting increases pressure on the ruptured disk. Sometimes a soft neck collar will be prescribed for a few days to weeks to help support your neck in the case of a cervical herniation. Physical therapy is often prescribed for patients with disk disease. Physical therapists will teach you how to properly lift, dress, walk, and perform other activities. They will work on strengthening the muscles that help support your spine. In some cases, physical therapy alone is not enough to treat a herniated disk. Steroid injections along the involved nerve root may be needed to help control pain. The steroid is injected in the area of the herniated disk and helps by reducing swelling around the disk. Sometimes surgery is the best option to treat a herniated disk.  SEEK IMMEDIATE MEDICAL CARE IF:   You have numbness, tingling, weakness, or problems with the use of your arms or legs.   You have severe headaches that are not relieved with the use of medicines.   You notice a change in your bowel or bladder control.   You have increasing pain in any areas of your body.   You experience shortness of breath, dizziness, or fainting.  MAKE SURE YOU:   Understand these instructions.   Will watch your condition.   Will get help right away if you are not doing well or get worse.  Document Released: 11/16/2000 Document Revised: 11/08/2011 Document Reviewed: 06/22/2011 [facility] Patient Information 2012 [place], [place].

## 2012-07-01 NOTE — Assessment & Plan Note (Signed)
Refer to ortho.

## 2012-07-01 NOTE — Assessment & Plan Note (Signed)
She has vague pain and burning in her skin so I have asked her to get a NCS/EMG done and I have ordered an MRI as well

## 2012-07-01 NOTE — Assessment & Plan Note (Signed)
Her plain film shows some DDD today, she an MRI report from 2009 that shows bulging discs and DDD, I have asked her to get a repeat MRI done of her neck
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
## 2012-07-07 ENCOUNTER — Encounter: Payer: Self-pay | Admitting: Internal Medicine

## 2012-07-07 ENCOUNTER — Ambulatory Visit
Admission: RE | Admit: 2012-07-07 | Discharge: 2012-07-07 | Disposition: A | Discharge status: Home / Self Care | Payer: Medicare Other | Source: Ambulatory Visit | Attending: Internal Medicine | Admitting: Internal Medicine

## 2012-07-07 DIAGNOSIS — M5020 Other cervical disc displacement, unspecified cervical region: Secondary | ICD-10-CM | POA: Diagnosis not present

## 2012-07-07 DIAGNOSIS — M5412 Radiculopathy, cervical region: Secondary | ICD-10-CM

## 2012-07-07 DIAGNOSIS — M47812 Spondylosis without myelopathy or radiculopathy, cervical region: Secondary | ICD-10-CM | POA: Diagnosis not present

## 2012-07-07 DIAGNOSIS — M5030 Other cervical disc degeneration, unspecified cervical region: Secondary | ICD-10-CM | POA: Diagnosis not present

## 2012-07-07 DIAGNOSIS — M542 Cervicalgia: Secondary | ICD-10-CM

## 2012-07-08 ENCOUNTER — Other Ambulatory Visit: Payer: Self-pay | Admitting: Internal Medicine

## 2012-07-08 DIAGNOSIS — Z1231 Encounter for screening mammogram for malignant neoplasm of breast: Secondary | ICD-10-CM

## 2012-07-10 DIAGNOSIS — M20039 Swan-neck deformity of unspecified finger(s): Secondary | ICD-10-CM | POA: Diagnosis not present

## 2012-07-15 DIAGNOSIS — G5600 Carpal tunnel syndrome, unspecified upper limb: Secondary | ICD-10-CM | POA: Diagnosis not present

## 2012-07-22 ENCOUNTER — Ambulatory Visit (INDEPENDENT_AMBULATORY_CARE_PROVIDER_SITE_OTHER): Payer: Medicare Other | Admitting: Internal Medicine

## 2012-07-22 ENCOUNTER — Encounter: Payer: Self-pay | Admitting: Internal Medicine

## 2012-07-22 ENCOUNTER — Ambulatory Visit
Admission: RE | Admit: 2012-07-22 | Discharge: 2012-07-22 | Disposition: A | Discharge status: Home / Self Care | Payer: Medicare Other | Source: Ambulatory Visit | Attending: Internal Medicine | Admitting: Internal Medicine

## 2012-07-22 VITALS — BP 110/62 | HR 59 | Temp 98.0°F | Ht <= 58 in | Wt 135.4 lb

## 2012-07-22 DIAGNOSIS — Z1231 Encounter for screening mammogram for malignant neoplasm of breast: Secondary | ICD-10-CM | POA: Diagnosis not present

## 2012-07-22 DIAGNOSIS — F319 Bipolar disorder, unspecified: Secondary | ICD-10-CM | POA: Diagnosis not present

## 2012-07-22 DIAGNOSIS — G5600 Carpal tunnel syndrome, unspecified upper limb: Secondary | ICD-10-CM

## 2012-07-22 DIAGNOSIS — G5601 Carpal tunnel syndrome, right upper limb: Secondary | ICD-10-CM | POA: Insufficient documentation

## 2012-07-22 DIAGNOSIS — M542 Cervicalgia: Secondary | ICD-10-CM | POA: Diagnosis not present

## 2012-07-22 DIAGNOSIS — I10 Essential (primary) hypertension: Secondary | ICD-10-CM

## 2012-07-22 HISTORY — DX: Carpal tunnel syndrome, right upper limb: G56.01

## 2012-07-22 NOTE — Patient Instructions (Addendum)
Continue all other medications as before No changes today Please have the pharmacy call with any refills you may need.

## 2012-07-22 NOTE — Progress Notes (Signed)
Subjective:    Patient ID: Madeline Mcdaniel, female    DOB: 08-22-1941, 71 y.o.   MRN: [id number]  HPI   Here to f/u;  Right neck/shoulder pain resolved;  Still with mild right CTS symptoms but mild only, no numb/weakness, found on NCS recent, does not want further tx now.  Pt denies chest pain, increased sob or doe, wheezing, orthopnea, PND, increased LE swelling, palpitations, dizziness or syncope.  Pt denies new neurological symptoms such as new headache, or facial or extremity weakness or numbness other than above.   Pt denies polydipsia, polyuria.   Pt denies fever, wt loss, night sweats, loss of appetite, or other constitutional symptoms  Overall good compliance with treatment, and good medicine tolerability.  Denies worsening depressive symptoms, suicidal ideation, or panic Past Medical History  Diagnosis Date  . Asthma 04/27/2011  . Degenerative joint disease 04/27/2011  . Bipolar affective disorder 04/27/2011  . Chronic bronchitis 04/27/2011  . GERD (gastroesophageal reflux disease) 04/27/2011  . Allergic rhinitis, cause unspecified 04/27/2011  . HTN (hypertension) 04/27/2011  . Hyperlipemia 04/27/2011  . Chronic cystitis 04/27/2011  . Colon polyps 04/27/2011  . Anemia 04/27/2011  . H/O: hysterectomy 04/27/2011  . TIA (transient ischemic attack) 04/27/2011  . Urinary incontinence 04/27/2011  . 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 Hyperlipidemia 04/27/2011  . Stroke   . Osteopenia 06/01/2012  . Left shoulder pain 06/01/2012    supraspinatous tendonitis, AC joint arthropathy/left impingement syndrome  . Cervical spondylosis 06/01/2012   Past Surgical History  Procedure Date  . Colonoscopy 69YRS AGO    IN [place],PT DOES NOT REMEMBER DOCTOR  . Orthoscopic carpetomy     BOTH THUMBS  . Trigger finger release     LEFT RING FINGER  . Bunionectomy     LEFT FOOT  . Total abdominal hysterectomy   . Tonsillectomy   . Bladder suspension     A&P REPAIR WITH MESH  . Steriotactic breast biopsy     RIGHT  .  Breast lumpectomy     SEVERAL TIMES  . Polypectomy 10 YRS AGO    BENIGN POLYPS X2    reports that she has never smoked. She has never used smokeless tobacco. She reports that she does not drink alcohol or use illicit drugs. family history includes Cancer (age of onset:62) in her mother.  There is no history of Colon cancer. Allergies  Allergen Reactions  . Vioxx (Rofecoxib) Anaphylaxis  . Aspirin   . Ciprofloxacin Other (See Comments)    HEADACHE  . Nsaids     BECAUSE OF REACTION TO VIOXX   Current Outpatient Prescriptions on File Prior to Visit  Medication Sig Dispense Refill  . acetaminophen (TYLENOL) 500 MG tablet Take 500 mg by mouth as needed.        Marland Kitchen albuterol (PROVENTIL,VENTOLIN) 90 MCG/ACT inhaler Inhale 2 puffs into the lungs every 4 (four) hours as needed.        . Coenzyme Q10 (CO Q 10 PO) Take 200 mg by mouth daily.        . Fluticasone-Salmeterol (ADVAIR DISKUS IN) Inhale 1 Inhaler into the lungs as needed. 100/50       . gabapentin (NEURONTIN) 300 MG capsule TAKE ONE CAPSULE BY MOUTH TWICE DAILY  180 capsule  1  . hydrochlorothiazide (HYDRODIURIL) 12.5 MG tablet TAKE 1 TABLET BY MOUTH EVERY DAY  90 tablet  1  . lisinopril (PRINIVIL,ZESTRIL) 10 MG tablet TAKE 1 TABLET BY MOUTH EVERY DAY  90 tablet  1  .  lithium carbonate 300 MG capsule TAKE 1 CAPSULE BY MOUTH TWICE DAILY .  180 capsule  1  . nitrofurantoin (MACRODANTIN) 50 MG capsule Take 1 capsule (50 mg total) by mouth daily.  30 capsule  5   Current Facility-Administered Medications on File Prior to Visit  Medication Dose Route Frequency Provider Last Rate Last Dose  . 0.9 %  sodium chloride infusion  500 mL Intravenous Continuous Hilarie Fredrickson, MD       Review of Systems Review of Systems  Constitutional: Negative for diaphoresis and unexpected weight change.  HENT: Negative for tinnitus.   Eyes: Negative for photophobia and visual disturbance.  Respiratory: Negative for choking and stridor.   Gastrointestinal:  Negative for vomiting and blood in stool.  Genitourinary: Negative for hematuria and decreased urine volume.  Musculoskeletal: Negative for gait problem.  Skin: Negative for color change and wound.  Neurological: Negative for tremors and numbness.     Objective:   Physical Exam BP 110/62  Pulse 59  Temp 98 F (36.7 C) (Oral)  Ht 4\' 9"  (1.448 m)  Wt 135 lb 6 oz (61.406 kg)  BMI 29.30 kg/m2  SpO2 97% Physical Exam  VS noted Constitutional: Pt appears well-developed and well-nourished.  HENT: Head: Normocephalic.  Right Ear: External ear normal.  Left Ear: External ear normal.  Eyes: Conjunctivae and EOM are normal. Pupils are equal, round, and reactive to light.  Neck: Normal range of motion. Neck supple.  Cardiovascular: Normal rate and regular rhythm.   Pulmonary/Chest: Effort normal and breath sounds normal.  Neurological: Pt is alert. Not confused Skin: Skin is warm. No erythema.  Psychiatric: Pt behavior is normal. Thought content normal.     Assessment & Plan:

## 2012-07-22 NOTE — Assessment & Plan Note (Signed)
stable overall by hx and exam, most recent data reviewed with pt, and pt to continue medical treatment as before Lab Results  Component Value Date   WBC 7.4 04/29/2012   HGB 13.0 04/29/2012   HCT 39.5 04/29/2012   PLT 175.0 04/29/2012   GLUCOSE 92 04/29/2012   CHOL 185 04/29/2012   TRIG 133.0 04/29/2012   HDL 60.60 04/29/2012   LDLDIRECT 99.0 04/27/2011   LDLCALC 98 04/29/2012   ALT 14 04/29/2012   AST 19 04/29/2012   NA 142 04/29/2012   K 4.0 04/29/2012   CL 107 04/29/2012   CREATININE 0.7 04/29/2012   BUN 16 04/29/2012   CO2 28 04/29/2012   TSH 2.28 04/29/2012

## 2012-07-22 NOTE — Assessment & Plan Note (Signed)
Resovled, exam benign,  to f/u any worsening symptoms or concerns

## 2012-07-22 NOTE — Assessment & Plan Note (Signed)
stable overall by hx and exam, most recent data reviewed with pt, and pt to continue medical treatment as before BP Readings from Last 3 Encounters:  07/22/12 110/62  07/01/12 134/70  04/29/12 120/70

## 2012-07-22 NOTE — Assessment & Plan Note (Signed)
stable overall by hx and exam, decliens further eval or tx,  to f/u any worsening symptoms or concerns

## 2012-08-21 ENCOUNTER — Other Ambulatory Visit: Payer: Self-pay | Admitting: Internal Medicine

## 2012-08-21 DIAGNOSIS — M20039 Swan-neck deformity of unspecified finger(s): Secondary | ICD-10-CM | POA: Diagnosis not present

## 2012-09-16 ENCOUNTER — Ambulatory Visit (INDEPENDENT_AMBULATORY_CARE_PROVIDER_SITE_OTHER): Payer: Medicare Other | Admitting: *Deleted

## 2012-09-16 DIAGNOSIS — Z23 Encounter for immunization: Secondary | ICD-10-CM

## 2012-09-21 ENCOUNTER — Other Ambulatory Visit: Payer: Self-pay | Admitting: Internal Medicine

## 2012-09-24 ENCOUNTER — Ambulatory Visit: Payer: Medicare Other

## 2012-10-17 ENCOUNTER — Other Ambulatory Visit: Payer: Self-pay | Admitting: Internal Medicine

## 2012-10-17 NOTE — Telephone Encounter (Signed)
Done erx 

## 2012-11-20 DIAGNOSIS — M20039 Swan-neck deformity of unspecified finger(s): Secondary | ICD-10-CM | POA: Diagnosis not present

## 2012-12-02 ENCOUNTER — Other Ambulatory Visit: Payer: Self-pay | Admitting: Orthopedic Surgery

## 2012-12-04 ENCOUNTER — Encounter (HOSPITAL_BASED_OUTPATIENT_CLINIC_OR_DEPARTMENT_OTHER): Payer: Self-pay | Admitting: *Deleted

## 2012-12-04 NOTE — Progress Notes (Signed)
To come in for [id number]  

## 2012-12-05 ENCOUNTER — Encounter (HOSPITAL_BASED_OUTPATIENT_CLINIC_OR_DEPARTMENT_OTHER)
Admission: RE | Admit: 2012-12-05 | Discharge: 2012-12-05 | Disposition: A | Discharge status: Home / Self Care | Payer: Medicare Other | Source: Ambulatory Visit | Attending: Orthopedic Surgery | Admitting: Orthopedic Surgery

## 2012-12-05 DIAGNOSIS — E785 Hyperlipidemia, unspecified: Secondary | ICD-10-CM | POA: Diagnosis not present

## 2012-12-05 DIAGNOSIS — M20039 Swan-neck deformity of unspecified finger(s): Secondary | ICD-10-CM | POA: Diagnosis not present

## 2012-12-05 DIAGNOSIS — Z01810 Encounter for preprocedural cardiovascular examination: Secondary | ICD-10-CM | POA: Diagnosis not present

## 2012-12-05 DIAGNOSIS — Z01812 Encounter for preprocedural laboratory examination: Secondary | ICD-10-CM | POA: Diagnosis not present

## 2012-12-05 DIAGNOSIS — I10 Essential (primary) hypertension: Secondary | ICD-10-CM | POA: Diagnosis not present

## 2012-12-05 DIAGNOSIS — K219 Gastro-esophageal reflux disease without esophagitis: Secondary | ICD-10-CM | POA: Diagnosis not present

## 2012-12-05 DIAGNOSIS — Z79899 Other long term (current) drug therapy: Secondary | ICD-10-CM | POA: Diagnosis not present

## 2012-12-05 DIAGNOSIS — Z8673 Personal history of transient ischemic attack (TIA), and cerebral infarction without residual deficits: Secondary | ICD-10-CM | POA: Diagnosis not present

## 2012-12-05 LAB — BASIC METABOLIC PANEL
BUN: 14 mg/dL (ref 6–23)
Chloride: 105 mEq/L (ref 96–112)
GFR calc non Af Amer: 84 mL/min — ABNORMAL LOW (ref >90)
Glucose, Bld: 85 mg/dL (ref 70–99)
Potassium: 4.9 mEq/L (ref 3.5–5.1)

## 2012-12-09 ENCOUNTER — Encounter (HOSPITAL_BASED_OUTPATIENT_CLINIC_OR_DEPARTMENT_OTHER): Payer: Self-pay | Admitting: Orthopedic Surgery

## 2012-12-09 ENCOUNTER — Encounter (HOSPITAL_BASED_OUTPATIENT_CLINIC_OR_DEPARTMENT_OTHER): Payer: Self-pay | Admitting: Anesthesiology

## 2012-12-09 ENCOUNTER — Ambulatory Visit (HOSPITAL_BASED_OUTPATIENT_CLINIC_OR_DEPARTMENT_OTHER): Payer: Medicare Other | Admitting: Anesthesiology

## 2012-12-09 ENCOUNTER — Encounter (HOSPITAL_BASED_OUTPATIENT_CLINIC_OR_DEPARTMENT_OTHER): Payer: Self-pay | Admitting: *Deleted

## 2012-12-09 ENCOUNTER — Ambulatory Visit (HOSPITAL_BASED_OUTPATIENT_CLINIC_OR_DEPARTMENT_OTHER)
Admission: RE | Admit: 2012-12-09 | Discharge: 2012-12-09 | Disposition: A | Discharge status: Home / Self Care | Payer: Medicare Other | Source: Ambulatory Visit | Attending: Orthopedic Surgery | Admitting: Orthopedic Surgery

## 2012-12-09 ENCOUNTER — Encounter (HOSPITAL_BASED_OUTPATIENT_CLINIC_OR_DEPARTMENT_OTHER)
Admission: RE | Disposition: A | Discharge status: Home / Self Care | Payer: Self-pay | Source: Ambulatory Visit | Attending: Orthopedic Surgery

## 2012-12-09 DIAGNOSIS — E785 Hyperlipidemia, unspecified: Secondary | ICD-10-CM | POA: Insufficient documentation

## 2012-12-09 DIAGNOSIS — Z01810 Encounter for preprocedural cardiovascular examination: Secondary | ICD-10-CM | POA: Insufficient documentation

## 2012-12-09 DIAGNOSIS — Z79899 Other long term (current) drug therapy: Secondary | ICD-10-CM | POA: Insufficient documentation

## 2012-12-09 DIAGNOSIS — I10 Essential (primary) hypertension: Secondary | ICD-10-CM | POA: Diagnosis not present

## 2012-12-09 DIAGNOSIS — M20039 Swan-neck deformity of unspecified finger(s): Secondary | ICD-10-CM | POA: Diagnosis not present

## 2012-12-09 DIAGNOSIS — Z01812 Encounter for preprocedural laboratory examination: Secondary | ICD-10-CM | POA: Insufficient documentation

## 2012-12-09 DIAGNOSIS — Z8673 Personal history of transient ischemic attack (TIA), and cerebral infarction without residual deficits: Secondary | ICD-10-CM | POA: Insufficient documentation

## 2012-12-09 DIAGNOSIS — K219 Gastro-esophageal reflux disease without esophagitis: Secondary | ICD-10-CM | POA: Insufficient documentation

## 2012-12-09 HISTORY — PX: TENDON TRANSFER: SHX6109

## 2012-12-09 SURGERY — TRANSFER, TENDON
Anesthesia: Regional | Site: Finger | Laterality: Left | Wound class: Clean

## 2012-12-09 MED ORDER — LACTATED RINGERS IV SOLN
INTRAVENOUS | Status: DC
  Administered 2012-12-09 (×2): via INTRAVENOUS

## 2012-12-09 MED ORDER — OXYCODONE HCL 5 MG PO TABS: 5.0000 mg | ORAL_TABLET | Freq: Once | ORAL | Status: DC | PRN

## 2012-12-09 MED ORDER — ONDANSETRON HCL 4 MG/2ML IJ SOLN: 4.0000 mg | Freq: Four times a day (QID) | INTRAMUSCULAR | Status: DC | PRN

## 2012-12-09 MED ORDER — OXYCODONE HCL 5 MG/5ML PO SOLN: 5.0000 mg | Freq: Once | ORAL | Status: DC | PRN

## 2012-12-09 MED ORDER — PROPOFOL 10 MG/ML IV EMUL
INTRAVENOUS | Status: DC | PRN
  Administered 2012-12-09: 25 ug/kg/min via INTRAVENOUS

## 2012-12-09 MED ORDER — CHLORHEXIDINE GLUCONATE 4 % EX LIQD: 60.0000 mL | Freq: Once | CUTANEOUS | Status: DC

## 2012-12-09 MED ORDER — CEFAZOLIN SODIUM-DEXTROSE 2-3 GM-% IV SOLR: 2.0000 g | INTRAVENOUS | Status: DC

## 2012-12-09 MED ORDER — FENTANYL CITRATE 0.05 MG/ML IJ SOLN
INTRAMUSCULAR | Status: DC | PRN
  Administered 2012-12-09: 50 ug via INTRAVENOUS

## 2012-12-09 MED ORDER — HYDROCODONE-ACETAMINOPHEN 7.5-325 MG PO TABS: 1.0000 | ORAL_TABLET | Freq: Four times a day (QID) | ORAL | Status: DC | PRN

## 2012-12-09 MED ORDER — BUPIVACAINE HCL (PF) 0.25 % IJ SOLN
INTRAMUSCULAR | Status: DC | PRN
  Administered 2012-12-09: 6 mL

## 2012-12-09 MED ORDER — LIDOCAINE HCL (CARDIAC) 20 MG/ML IV SOLN
INTRAVENOUS | Status: DC | PRN
  Administered 2012-12-09: 30 mg via INTRAVENOUS

## 2012-12-09 MED ORDER — LIDOCAINE HCL (PF) 0.5 % IJ SOLN
INTRAMUSCULAR | Status: DC | PRN
  Administered 2012-12-09: 30 mL via INTRAVENOUS

## 2012-12-09 MED ORDER — FENTANYL CITRATE 0.05 MG/ML IJ SOLN: 25.0000 ug | INTRAMUSCULAR | Status: DC | PRN

## 2012-12-09 MED ORDER — ONDANSETRON HCL 4 MG/2ML IJ SOLN
INTRAMUSCULAR | Status: DC | PRN
  Administered 2012-12-09: 4 mg via INTRAVENOUS

## 2012-12-09 SURGICAL SUPPLY — 45 items
ANCHOR JUGGERKNOT 1.0 1DR 2-0 (Anchor) ×3 IMPLANT
BANDAGE GAUZE ELAST BULKY 4 IN (GAUZE/BANDAGES/DRESSINGS) ×3 IMPLANT
BLADE MINI RND TIP GREEN BEAV (BLADE) ×3 IMPLANT
BLADE SURG 15 STRL LF DISP TIS (BLADE) ×2 IMPLANT
BLADE SURG 15 STRL SS (BLADE) ×1
BNDG COHESIVE 3X5 TAN STRL LF (GAUZE/BANDAGES/DRESSINGS) ×3 IMPLANT
BNDG ESMARK 4X9 LF (GAUZE/BANDAGES/DRESSINGS) ×3 IMPLANT
BUR FAST CUTTING MED (BURR) IMPLANT
CHLORAPREP W/TINT 26ML (MISCELLANEOUS) ×3 IMPLANT
CLOTH BEACON ORANGE TIMEOUT ST (SAFETY) ×3 IMPLANT
CORDS BIPOLAR (ELECTRODE) ×3 IMPLANT
COVER MAYO STAND STRL (DRAPES) ×3 IMPLANT
COVER TABLE BACK 60X90 (DRAPES) ×3 IMPLANT
CUFF TOURNIQUET SINGLE 18IN (TOURNIQUET CUFF) ×3 IMPLANT
DRAPE EXTREMITY T 121X128X90 (DRAPE) ×3 IMPLANT
DRAPE OEC MINIVIEW 54X84 (DRAPES) ×3 IMPLANT
DRAPE SURG 17X23 STRL (DRAPES) ×3 IMPLANT
GAUZE XEROFORM 1X8 LF (GAUZE/BANDAGES/DRESSINGS) ×3 IMPLANT
GLOVE BIO SURGEON STRL SZ 6.5 (GLOVE) ×3 IMPLANT
GLOVE BIOGEL PI IND STRL 8.5 (GLOVE) ×2 IMPLANT
GLOVE BIOGEL PI INDICATOR 8.5 (GLOVE) ×1
GLOVE SURG ORTHO 8.0 STRL STRW (GLOVE) ×3 IMPLANT
GOWN BRE IMP PREV XXLGXLNG (GOWN DISPOSABLE) ×6 IMPLANT
GOWN PREVENTION PLUS XLARGE (GOWN DISPOSABLE) ×3 IMPLANT
NS IRRIG 1000ML POUR BTL (IV SOLUTION) ×3 IMPLANT
PACK BASIN DAY SURGERY FS (CUSTOM PROCEDURE TRAY) ×3 IMPLANT
PAD CAST 3X4 CTTN HI CHSV (CAST SUPPLIES) ×2 IMPLANT
PADDING CAST ABS 3INX4YD NS (CAST SUPPLIES)
PADDING CAST ABS 4INX4YD NS (CAST SUPPLIES) ×1
PADDING CAST ABS COTTON 3X4 (CAST SUPPLIES) IMPLANT
PADDING CAST ABS COTTON 4X4 ST (CAST SUPPLIES) ×2 IMPLANT
PADDING CAST COTTON 3X4 STRL (CAST SUPPLIES) ×1
SLEEVE SCD COMPRESS KNEE MED (MISCELLANEOUS) ×3 IMPLANT
SPLINT PLASTER CAST XFAST 3X15 (CAST SUPPLIES) ×20 IMPLANT
SPLINT PLASTER XTRA FASTSET 3X (CAST SUPPLIES) ×10
SPONGE GAUZE 4X4 12PLY (GAUZE/BANDAGES/DRESSINGS) ×3 IMPLANT
STOCKINETTE 4X48 STRL (DRAPES) ×3 IMPLANT
SUT SILK 4 0 PS 2 (SUTURE) ×3 IMPLANT
SUT VICRYL 4-0 PS2 18IN ABS (SUTURE) ×3 IMPLANT
SUT VICRYL RAPIDE 4/0 PS 2 (SUTURE) ×3 IMPLANT
SYR BULB 3OZ (MISCELLANEOUS) ×3 IMPLANT
SYR CONTROL 10ML LL (SYRINGE) ×3 IMPLANT
TOWEL OR 17X24 6PK STRL BLUE (TOWEL DISPOSABLE) ×3 IMPLANT
UNDERPAD 30X30 INCONTINENT (UNDERPADS AND DIAPERS) ×3 IMPLANT
WATER STERILE IRR 1000ML POUR (IV SOLUTION) IMPLANT

## 2012-12-09 NOTE — Anesthesia Procedure Notes (Addendum)
Procedure Name: MAC Date/Time: 12/09/2012 10:00 AM Performed by: Tobin Witucki D Pre-anesthesia Checklist: Patient identified, Emergency Drugs available, Suction available, Patient being monitored and Timeout performed Patient Re-evaluated:Patient Re-evaluated prior to inductionOxygen Delivery Method: Simple face mask   Anesthesia Regional Block:  Bier block (IV Regional)  Pre-Anesthetic Checklist: ,, timeout performed, Correct Patient, Correct Site, Correct Laterality, Correct Procedure,, site marked, surgical consent,, at surgeon's request Needles:  Injection technique: Single-shot  Needle Type: Other          Additional Needles: Bier block (IV Regional) Narrative:  Injection made incrementally with aspirations every 30 mL.  Performed by: Personally   Bier block (IV Regional)

## 2012-12-09 NOTE — Anesthesia Preprocedure Evaluation (Addendum)
Anesthesia Evaluation  Patient identified by MRN, date of birth, ID band Patient awake    Reviewed: Allergy & Precautions, H&P , NPO status , Patient's Chart, lab work & pertinent test results  Airway Mallampati: II  Neck ROM: full    Dental   Pulmonary asthma ,          Cardiovascular hypertension,     Neuro/Psych Bipolar Disorder TIA Neuromuscular disease CVA    GI/Hepatic GERD-  ,  Endo/Other    Renal/GU      Musculoskeletal   Abdominal   Peds  Hematology   Anesthesia Other Findings   Reproductive/Obstetrics                           Anesthesia Physical Anesthesia Plan  ASA: III  Anesthesia Plan: Bier Block and MAC   Post-op Pain Management:    Induction: Intravenous  Airway Management Planned: LMA  Additional Equipment:   Intra-op Plan:   Post-operative Plan:   Informed Consent: I have reviewed the patients History and Physical, chart, labs and discussed the procedure including the risks, benefits and alternatives for the proposed anesthesia with the patient or authorized representative who has indicated his/her understanding and acceptance.     Plan Discussed with: CRNA and Surgeon  Anesthesia Plan Comments:        Anesthesia Quick Evaluation

## 2012-12-09 NOTE — H&P (Signed)
Madeline Mcdaniel is a 72 year-old right-hand dominant female with a swan neck deformity left ring finger. This has been going on for at least two years. She had a trigger finger release done in [place], [place].  Unfortunately, she suffered a fall approximately two week later and noted inability to fully flex and extend the finger without the swan neck deformity occurring. She has no prior history of injury. No history of diabetes, thyroid problems, arthritis or gout. She complains of an intermittent, moderate dull aching type pain with a feeling of swelling.  She states it is gradually getting worse. She had figure-of-8 splint for a period of time, but lost it when she moved.  She has been taking Tylenol for discomfort. She is not complaining of any numbness or tingling.    ALLERGIES:   Vioxx.  MEDICATIONS:    Lithium, gabapentin, Lisinopril, Macrodantin, CoQ 10  SURGICAL HISTORY:    Tonsillectomy '49, hysterectomy '86, bunionectomy '93, arthroscopic carpectomy 2003 and 2004, A&P repair 2008, and breast biopsies 2008.   FAMILY MEDICAL HISTORY:   Positive for diabetes and high blood pressure.  SOCIAL HISTORY:    She does not smoke or drink. She is a retired [employer].  REVIEW OF SYSTEMS:   Positive for glasses, high blood pressure, pain with urination, lumps, depression, otherwise negative.  MAJESTY STEHLIN is an 72 y.o. female.   Chief Complaint: deformity LRF HPI: see above  Past Medical History  Diagnosis Date  . Asthma 04/27/2011  . Degenerative joint disease 04/27/2011  . Bipolar affective disorder 04/27/2011  . Chronic bronchitis 04/27/2011  . GERD (gastroesophageal reflux disease) 04/27/2011  . Allergic rhinitis, cause unspecified 04/27/2011  . HTN (hypertension) 04/27/2011  . Hyperlipemia 04/27/2011  . Chronic cystitis 04/27/2011  . Colon polyps 04/27/2011  . Anemia 04/27/2011  . H/O: hysterectomy 04/27/2011  . TIA (transient ischemic attack) 04/27/2011  . Urinary incontinence 04/27/2011  . Recurrent UTI  04/27/2011  . Hyperlipidemia 04/27/2011  . Stroke   . Osteopenia 06/01/2012  . Left shoulder pain 06/01/2012    supraspinatous tendonitis, AC joint arthropathy/left impingement syndrome  . Cervical spondylosis 06/01/2012  . Right carpal tunnel syndrome 07/22/2012    Past Surgical History  Procedure Date  . Colonoscopy 2YRS AGO    IN [place],PT DOES NOT REMEMBER DOCTOR  . Orthoscopic carpetomy     BOTH THUMBS  . Trigger finger release     LEFT RING FINGER  . Bunionectomy     LEFT FOOT  . Total abdominal hysterectomy   . Tonsillectomy   . Bladder suspension     A&P REPAIR WITH MESH  . Steriotactic breast biopsy     RIGHT  . Breast lumpectomy     SEVERAL TIMES  . Polypectomy 10 YRS AGO    BENIGN POLYPS X2    Family History  Problem Relation Age of Onset  . Cancer Mother 10    RENAL CELL CARCINOMA  . Colon cancer Neg Hx    Social History:  reports that she has never smoked. She has never used smokeless tobacco. She reports that she does not drink alcohol or use illicit drugs.  Allergies:  Allergies  Allergen Reactions  . Vioxx (Rofecoxib) Anaphylaxis  . Aspirin   . Ciprofloxacin Other (See Comments)    HEADACHE  . Nsaids     BECAUSE OF REACTION TO VIOXX    Medications Prior to Admission  Medication Sig Dispense Refill  . acetaminophen (TYLENOL) 500 MG tablet Take 500 mg by mouth as  needed.        Marland Kitchen albuterol (PROVENTIL,VENTOLIN) 90 MCG/ACT inhaler Inhale 2 puffs into the lungs every 4 (four) hours as needed.        . Coenzyme Q10 (CO Q 10 PO) Take 200 mg by mouth daily.        Marland Kitchen gabapentin (NEURONTIN) 300 MG capsule TAKE ONE CAPSULE BY MOUTH TWICE DAILY  180 capsule  1  . hydrochlorothiazide (HYDRODIURIL) 12.5 MG tablet TAKE ONE CAPSULE BY MOUTH EVERY DAY  90 tablet  1  . lansoprazole (PREVACID) 30 MG capsule Take 30 mg by mouth daily.      Marland Kitchen lisinopril (PRINIVIL,ZESTRIL) 10 MG tablet TAKE 1 TABLET BY MOUTH EVERY DAY  90 tablet  1  . lithium carbonate 300 MG capsule  TAKE 1 CAPSULE BY MOUTH TWICE DAILY .  180 capsule  1  . Fluticasone-Salmeterol (ADVAIR DISKUS IN) Inhale 1 Inhaler into the lungs as needed. 100/50      . nitrofurantoin (MACRODANTIN) 50 MG capsule TAKE ONE CAPSULE BY MOUTH DAILY  30 capsule  5    No results found for this or any previous visit (from the past 48 hour(s)).  No results found.   Pertinent items are noted in HPI.  Blood pressure 115/75, pulse 58, temperature 98.3 F (36.8 C), temperature source Oral, resp. rate 20, height 4\' 9"  (1.448 m), weight 60.328 kg (133 lb), SpO2 98.00%.  General appearance: alert, cooperative and appears stated age Head: Normocephalic, without obvious abnormality Neck: no adenopathy Resp: clear to auscultation bilaterally Cardio: regular rate and rhythm, S1, S2 normal, no murmur, click, rub or gallop GI: soft, non-tender; bowel sounds normal; no masses,  no organomegaly Extremities: extremities normal, atraumatic, no cyanosis or edema Pulses: 2+ and symmetric Skin: Skin color, texture, turgor normal. No rashes or lesions Neurologic: Grossly normal Incision/Wound: na  Assessment/Plan DX left ring finger swan neck deformity.  This has not significantly changed. Her function is adequate however she would like to see if this can be corrected surgically. We would recommend a tenodesis using one limb of the superficialis. She is advised of risks and complications including recurrence, injury to artery, nerves and tendons. She is scheduled for tenodesis PIP joint left ring finger using one slip of the superficialis left hand. This will be scheduled as an outpatient under regional anesthesia.  Blondell Laperle R 12/09/2012, 9:41 AM

## 2012-12-09 NOTE — Brief Op Note (Signed)
12/09/2012  11:04 AM  PATIENT:  Madeline Mcdaniel  72 y.o. female  PRE-OPERATIVE DIAGNOSIS:  SWAN NECK DEFORMITY LEFT RING FINGER  POST-OPERATIVE DIAGNOSIS:  SWAN NECK DEFORMITY LEFT RING FINGER  PROCEDURE:  Procedure(s) (LRB) with comments: TENDON TRANSFER (Left) - Tenodesis PIP with FDS Slip Left ring finger, Juggerknot  SURGEON:  Surgeon(s) and Role:    * Nicki Reaper, MD - Primary  PHYSICIAN ASSISTANT:   ASSISTANTS: K Sanad Fearnow,MD   ANESTHESIA:   local and regional  EBL:  Total I/O In: 1200 [I.V.:1200] Out: -   BLOOD ADMINISTERED:none  DRAINS: none   LOCAL MEDICATIONS USED:  MARCAINE     SPECIMEN:  No Specimen  DISPOSITION OF SPECIMEN:  N/A  COUNTS:  YES  TOURNIQUET:   Total Tourniquet Time Documented: Forearm (Left) - 48 minutes  DICTATION: .Other Dictation: Dictation Number [id number]  PLAN OF CARE: Discharge to home after PACU  PATIENT DISPOSITION:  PACU - hemodynamically stable.

## 2012-12-09 NOTE — Transfer of Care (Signed)
Immediate Anesthesia Transfer of Care Note  Patient: Madeline Mcdaniel  Procedure(s) Performed: Procedure(s) (LRB) with comments: TENDON TRANSFER (Left) - Tenodesis PIP with FDS Slip Left ring finger, Juggerknot  Patient Location: PACU  Anesthesia Type:Bier block  Level of Consciousness: awake, alert , oriented and patient cooperative  Airway & Oxygen Therapy: Patient Spontanous Breathing and Patient connected to face mask oxygen  Post-op Assessment: Report given to PACU RN and Post -op Vital signs reviewed and stable  Post vital signs: Reviewed and stable  Complications: No apparent anesthesia complications

## 2012-12-09 NOTE — Anesthesia Postprocedure Evaluation (Signed)
Anesthesia Post Note  Patient: Madeline Mcdaniel  Procedure(s) Performed: Procedure(s) (LRB): TENDON TRANSFER (Left)  Anesthesia type: MAC and Bier block  Patient location: PACU  Post pain: Pain level controlled and Adequate analgesia  Post assessment: Post-op Vital signs reviewed, Patient's Cardiovascular Status Stable and Respiratory Function Stable  Last Vitals:  Filed Vitals:   12/09/12 1130  BP: 117/68  Pulse: 51  Temp:   Resp: 15    Post vital signs: Reviewed and stable  Level of consciousness: awake, alert  and oriented  Complications: No apparent anesthesia complications

## 2012-12-09 NOTE — Op Note (Signed)
Dictation Number [id number]

## 2012-12-10 NOTE — Op Note (Signed)
NAMELashell, Moffitt                        ACCOUNT NO.:  [id number]  MEDICAL RECORD NO.:  [id number]  LOCATION:                                 FACILITY:  PHYSICIAN:  Cindee Salt, M.D.            DATE OF BIRTH:  DATE OF PROCEDURE:  12/09/2012 DATE OF DISCHARGE:                              OPERATIVE REPORT   PREOPERATIVE DIAGNOSIS:  Swan-neck deformity, left ring finger.  POSTOPERATIVE DIAGNOSIS:  Swan-neck deformity, left ring finger.  OPERATION:  Tenodesis proximal interphalangeal joint with ulnar slip of flexor digitorum superficialis.  SURGEON:  Cindee Salt, M.D.  ANESTHESIA:  Forearm-based IV regional with local infiltration metacarpal block.  ANESTHESIOLOGIST:  Achille Rich, MD.  HISTORY:  The patient is a 72 year old female with a history of a swan- neck deformity of left ring finger.  She has been treated conservatively with the ring splints.  This has resolved symptoms for her, but she is desirous of proceeding to have the splint replaced with surgical intervention.  Pre, peri, postoperative course have been discussed along with risks and complications.  She is aware that there is no guarantee with the surgery; possibility of infection; recurrence of injury to arteries, nerves, tendons, incomplete relief of symptoms, dystrophy, possibility of recurrence with stretching of the tenodesis.  Pre, peri, and postoperative course have been discussed.  In the preoperative area, the patient is seen, the extremity marked by both patient and surgeon. Antibiotic given.  PROCEDURE:  The patient was brought to the operating room, forearm-based IV regional anesthetic was carried out without difficulty with left arm free.  A 3-minute dry time was allowed.  Time-out taken, confirming patient and procedure.  An mid lateral oblique incision was made over the middle, proximal, and metacarpophalangeal joint area of the left ring finger carried down through subcutaneous tissue.  Bleeders  were electrocauterized with bipolar.  Neurovascular structures identified and protected.  Flexion sheath was protected.  A small window was made just distal to the A3 pulley.  On the ulnar aspect of the finger at the level of the middle phalanx, insertion of the superficial flexor ulnar limb. Separate incision was then made proximally at the A1 pulley.  She had a prior release of the A1 pulley.  The scar was excised revealing the decussation of the limbs of the superficialis.  The ulnar limb was then dissected free, transected proximally.  This was then delivered distally, brought out through the flexor sheath.  This was brought to the radial side to the proximal phalanx.  A drill hole place with a 1 mm juggernaut into the proximal phalanx radial volar surface of the juggernaut placed.  The finger placed into approximately 20 degrees of flexion at the PIP joint, and the tenodesis secured onto the juggernaut. This maintained the finger in a flexed position at the PIP joint with approximately 20 degrees with further full flexion.  Wound was copiously irrigated with saline.  Excess superficialis tendon proximally was excised.  The skin was then closed with interrupted 4-0 Vicryl Rapide sutures.  A metacarpal block was then given 0.25% Marcaine without epinephrine, 6 mL  was used. Sterile compressive dressing and splint to the middle, ring, and little fingers to the left hand was applied.  On deflation of the tourniquet, all fingers immediately pinked.  She was taken to the recovery room for observation in satisfactory condition.  She will be discharged home to return in 1 week on Vicodin.          ______________________________ Cindee Salt, M.D.     GK/MEDQ  D:  12/09/2012  T:  12/10/2012  Job:  [id number]

## 2012-12-11 ENCOUNTER — Encounter (HOSPITAL_BASED_OUTPATIENT_CLINIC_OR_DEPARTMENT_OTHER): Payer: Self-pay | Admitting: Orthopedic Surgery

## 2012-12-29 ENCOUNTER — Other Ambulatory Visit: Payer: Self-pay | Admitting: Internal Medicine

## 2012-12-29 NOTE — Telephone Encounter (Signed)
Done erx 

## 2013-01-05 DIAGNOSIS — M20039 Swan-neck deformity of unspecified finger(s): Secondary | ICD-10-CM | POA: Diagnosis not present

## 2013-01-26 DIAGNOSIS — M20039 Swan-neck deformity of unspecified finger(s): Secondary | ICD-10-CM | POA: Diagnosis not present

## 2013-01-29 ENCOUNTER — Other Ambulatory Visit: Payer: Self-pay | Admitting: Internal Medicine

## 2013-01-29 NOTE — Telephone Encounter (Signed)
Done erx 

## 2013-04-02 ENCOUNTER — Ambulatory Visit: Payer: Medicare Other | Admitting: Internal Medicine

## 2013-04-08 ENCOUNTER — Other Ambulatory Visit (INDEPENDENT_AMBULATORY_CARE_PROVIDER_SITE_OTHER): Payer: Medicare Other

## 2013-04-08 ENCOUNTER — Encounter: Payer: Self-pay | Admitting: Internal Medicine

## 2013-04-08 ENCOUNTER — Ambulatory Visit (INDEPENDENT_AMBULATORY_CARE_PROVIDER_SITE_OTHER): Payer: Medicare Other | Admitting: Internal Medicine

## 2013-04-08 VITALS — BP 140/80 | HR 58 | Temp 97.4°F | Ht <= 58 in | Wt 130.5 lb

## 2013-04-08 DIAGNOSIS — R5381 Other malaise: Secondary | ICD-10-CM

## 2013-04-08 DIAGNOSIS — I10 Essential (primary) hypertension: Secondary | ICD-10-CM | POA: Diagnosis not present

## 2013-04-08 DIAGNOSIS — E785 Hyperlipidemia, unspecified: Secondary | ICD-10-CM

## 2013-04-08 DIAGNOSIS — M1990 Unspecified osteoarthritis, unspecified site: Secondary | ICD-10-CM | POA: Diagnosis not present

## 2013-04-08 DIAGNOSIS — F319 Bipolar disorder, unspecified: Secondary | ICD-10-CM

## 2013-04-08 DIAGNOSIS — Z23 Encounter for immunization: Secondary | ICD-10-CM | POA: Diagnosis not present

## 2013-04-08 DIAGNOSIS — R5383 Other fatigue: Secondary | ICD-10-CM

## 2013-04-08 DIAGNOSIS — G3184 Mild cognitive impairment, so stated: Secondary | ICD-10-CM

## 2013-04-08 LAB — TSH: TSH: 3.09 u[IU]/mL (ref 0.35–5.50)

## 2013-04-08 LAB — BASIC METABOLIC PANEL
CO2: 29 mEq/L (ref 19–32)
Calcium: 10 mg/dL (ref 8.4–10.5)
Creatinine, Ser: 0.8 mg/dL (ref 0.4–1.2)
GFR: 78.02 mL/min (ref >60.00)
Glucose, Bld: 86 mg/dL (ref 70–99)
Sodium: 139 mEq/L (ref 135–145)

## 2013-04-08 LAB — CBC WITH DIFFERENTIAL/PLATELET
Eosinophils Relative: 1.5 % (ref 0.0–5.0)
HCT: 40.1 % (ref 36.0–46.0)
Hemoglobin: 13.6 g/dL (ref 12.0–15.0)
Lymphs Abs: 2.9 10*3/uL (ref 0.7–4.0)
Monocytes Relative: 8 % (ref 3.0–12.0)
Neutro Abs: 5.5 10*3/uL (ref 1.4–7.7)
Platelets: 211 10*3/uL (ref 150.0–400.0)
WBC: 9.3 10*3/uL (ref 4.5–10.5)

## 2013-04-08 LAB — URINALYSIS, ROUTINE W REFLEX MICROSCOPIC
Ketones, ur: NEGATIVE
Specific Gravity, Urine: 1.01 (ref 1.000–1.030)
pH: 7 (ref 5.0–8.0)

## 2013-04-08 LAB — LIPID PANEL
Cholesterol: 200 mg/dL (ref 0–200)
HDL: 50.2 mg/dL (ref >39.00)
Total CHOL/HDL Ratio: 4
Triglycerides: 235 mg/dL — ABNORMAL HIGH (ref 0.0–149.0)

## 2013-04-08 LAB — HEPATIC FUNCTION PANEL
Albumin: 4.3 g/dL (ref 3.5–5.2)
Total Protein: 6.9 g/dL (ref 6.0–8.3)

## 2013-04-08 MED ORDER — GABAPENTIN 300 MG PO CAPS: ORAL_CAPSULE | ORAL | Status: DC

## 2013-04-08 MED ORDER — LISINOPRIL 10 MG PO TABS: 10.0000 mg | ORAL_TABLET | Freq: Every day | ORAL | Status: DC

## 2013-04-08 MED ORDER — LANSOPRAZOLE 30 MG PO CPDR: 30.0000 mg | DELAYED_RELEASE_CAPSULE | Freq: Every day | ORAL | Status: DC

## 2013-04-08 MED ORDER — HYDROCHLOROTHIAZIDE 12.5 MG PO TABS: 12.5000 mg | ORAL_TABLET | Freq: Every day | ORAL | Status: DC

## 2013-04-08 NOTE — Patient Instructions (Addendum)
You had the Tetanus shot (Td) today Please continue all other medications as before, and refills have been done if requested  - including the gabapentin Please have the pharmacy call with any other refills you may need. Please continue your efforts at being more active, low cholesterol diet, and weight control. You are otherwise up to date with prevention measures today. Please go to the LAB in the Basement (turn left off the elevator) for the tests to be done today You will be contacted by phone if any changes need to be made immediately.  Otherwise, you will receive a letter about your results with an explanation, but please check with MyChart first. Thank you for enrolling in MyChart. Please follow the instructions below to securely access your online medical record. MyChart allows you to send messages to your doctor, view your test results, renew your prescriptions, schedule appointments, and more. Please return in 1 year for your yearly visit, or sooner if needed

## 2013-04-08 NOTE — Progress Notes (Signed)
Subjective:    Patient ID: Madeline Mcdaniel, female    DOB: March 30, 1941, 72 y.o.   MRN: [id number]  HPI  Here for f/u;  Overall doing ok;  Pt denies CP, worsening SOB, DOE, wheezing, orthopnea, PND, worsening LE edema, palpitations, dizziness or syncope.  Pt denies neurological change such as new headache, facial or extremity weakness.  Pt denies polydipsia, polyuria, or low sugar symptoms. Pt states overall good compliance with treatment and medications, good tolerability, and has been trying to follow lower cholesterol diet.  Pt denies worsening depressive symptoms, suicidal ideation or panic. No fever, night sweats, wt loss, loss of appetite, or other constitutional symptoms.  Pt states good ability with ADL's, has low fall risk, home safety reviewed and adequate, no other significant changes in hearing or vision, and only occasionally active with exercise.  Cervical pain minor only at this point, has c-spine DJD/DDD noted per MRI aug 2013. Overall pain ok, needs gabapentin refilled.  Denies worsening abd pain, dysphagia, n/v, bowel change or blood, but has mild worsening intermittent reflux.  Also with mention of memory problem not noticed in the past, ongoing for over 6 mo with main problem not remembering names, and less ability to remember how to get places with driving as well as she used to.  Does c/o ongoing fatigue, but denies signficant daytime hypersomnolence.   Past Medical History  Diagnosis Date  . Asthma 04/27/2011  . Degenerative joint disease 04/27/2011  . Bipolar affective disorder 04/27/2011  . Chronic bronchitis 04/27/2011  . GERD (gastroesophageal reflux disease) 04/27/2011  . Allergic rhinitis, cause unspecified 04/27/2011  . HTN (hypertension) 04/27/2011  . Hyperlipemia 04/27/2011  . Chronic cystitis 04/27/2011  . Colon polyps 04/27/2011  . Anemia 04/27/2011  . H/O: hysterectomy 04/27/2011  . TIA (transient ischemic attack) 04/27/2011  . Urinary incontinence 04/27/2011  . Recurrent UTI  04/27/2011  . Hyperlipidemia 04/27/2011  . Stroke   . Osteopenia 06/01/2012  . Left shoulder pain 06/01/2012    supraspinatous tendonitis, AC joint arthropathy/left impingement syndrome  . Cervical spondylosis 06/01/2012  . Right carpal tunnel syndrome 07/22/2012   Past Surgical History  Procedure Laterality Date  . Colonoscopy  32YRS AGO    IN [place],PT DOES NOT REMEMBER DOCTOR  . Orthoscopic carpetomy      BOTH THUMBS  . Trigger finger release      LEFT RING FINGER  . Bunionectomy      LEFT FOOT  . Total abdominal hysterectomy    . Tonsillectomy    . Bladder suspension      A&P REPAIR WITH MESH  . Steriotactic breast biopsy      RIGHT  . Breast lumpectomy      SEVERAL TIMES  . Polypectomy  10 YRS AGO    BENIGN POLYPS X2  . Tendon transfer  12/09/2012    Procedure: TENDON TRANSFER;  Surgeon: Nicki Reaper, MD;  Location: [facility];  Service: Orthopedics;  Laterality: Left;  Tenodesis PIP with FDS Slip Left ring finger, Juggerknot    reports that she has never smoked. She has never used smokeless tobacco. She reports that she does not drink alcohol or use illicit drugs. family history includes Cancer (age of onset: 48) in her mother.  There is no history of Colon cancer. Allergies  Allergen Reactions  . Vioxx (Rofecoxib) Anaphylaxis  . Aspirin   . Ciprofloxacin Other (See Comments)    HEADACHE  . Nsaids     BECAUSE OF REACTION TO VIOXX  Current Outpatient Prescriptions on File Prior to Visit  Medication Sig Dispense Refill  . acetaminophen (TYLENOL) 500 MG tablet Take 500 mg by mouth as needed.        Marland Kitchen albuterol (PROVENTIL,VENTOLIN) 90 MCG/ACT inhaler Inhale 2 puffs into the lungs every 4 (four) hours as needed.        . Coenzyme Q10 (CO Q 10 PO) Take 200 mg by mouth daily.        . Fluticasone-Salmeterol (ADVAIR DISKUS IN) Inhale 1 Inhaler into the lungs as needed. 100/50      . gabapentin (NEURONTIN) 300 MG capsule TAKE ONE CAPSULE BY MOUTH TWICE DAILY   180 capsule  1  . lithium carbonate 300 MG capsule TAKE 1 CAPSULE BY MOUTH TWICE DAILY .  180 capsule  1  . nitrofurantoin (MACRODANTIN) 50 MG capsule TAKE ONE CAPSULE BY MOUTH DAILY  30 capsule  5   No current facility-administered medications on file prior to visit.    Review of Systems Constitutional: Negative for diaphoresis, activity change, appetite change or unexpected weight change.  HENT: Negative for hearing loss, ear pain, facial swelling, mouth sores and neck stiffness.   Eyes: Negative for pain, redness and visual disturbance.  Respiratory: Negative for shortness of breath and wheezing.   Cardiovascular: Negative for chest pain and palpitations.  Gastrointestinal: Negative for diarrhea, blood in stool, abdominal distention or other pain Genitourinary: Negative for hematuria, flank pain or change in urine volume.  Musculoskeletal: Negative for myalgias and joint swelling.  Skin: Negative for color change and wound.  Neurological: Negative for syncope and numbness. other than noted Hematological: Negative for adenopathy.  Psychiatric/Behavioral: Negative for hallucinations, self-injury, decreased concentration and agitation.      Objective:   Physical Exam BP 140/80  Pulse 58  Temp(Src) 97.4 F (36.3 C) (Oral)  Ht 4\' 9"  (1.448 m)  Wt 130 lb 8 oz (59.194 kg)  BMI 28.23 kg/m2  SpO2 95% VS noted,  Constitutional: Pt is oriented to person, place, and time. Appears well-developed and well-nourished.  Head: Normocephalic and atraumatic.  Right Ear: External ear normal.  Left Ear: External ear normal.  Nose: Nose normal.  Mouth/Throat: Oropharynx is clear and moist.  Eyes: Conjunctivae and EOM are normal. Pupils are equal, round, and reactive to light.  Neck: Normal range of motion. Neck supple. No JVD present. No tracheal deviation present.  Cardiovascular: Normal rate, regular rhythm, normal heart sounds and intact distal pulses.   Pulmonary/Chest: Effort normal and  breath sounds normal.  Abdominal: Soft. Bowel sounds are normal. There is no tenderness. No HSM  Musculoskeletal: Normal range of motion. Exhibits no edema.  Lymphadenopathy:  Has no cervical adenopathy.  Neurological: Pt is alert and oriented to person, place, and time. Pt has normal reflexes. No cranial nerve deficit.  Skin: Skin is warm and dry. No rash noted.  Psychiatric:  Has  normal mood and affect. Behavior is normal.     Assessment & Plan:

## 2013-04-09 LAB — LITHIUM LEVEL: Lithium Lvl: 0.6 mEq/L — ABNORMAL LOW (ref 0.80–1.40)

## 2013-04-12 ENCOUNTER — Encounter: Payer: Self-pay | Admitting: Internal Medicine

## 2013-04-12 DIAGNOSIS — G3184 Mild cognitive impairment, so stated: Secondary | ICD-10-CM

## 2013-04-12 HISTORY — DX: Mild cognitive impairment of uncertain or unknown etiology: G31.84

## 2013-04-12 NOTE — Assessment & Plan Note (Signed)
stable overall by history and exam, recent data reviewed with pt, and pt to continue medical treatment as before,  to f/u any worsening symptoms or concerns BP Readings from Last 3 Encounters:  04/08/13 140/80  12/09/12 126/63  12/09/12 126/63

## 2013-04-12 NOTE — Assessment & Plan Note (Signed)
stable overall by history and exam, recent data reviewed with pt, and pt to continue medical treatment as before,  to f/u any worsening symptoms or concerns Lab Results  Component Value Date   LDLCALC 98 04/29/2012

## 2013-04-12 NOTE — Assessment & Plan Note (Signed)
stable overall by history and exam, and pt to continue medical treatment as before,  to f/u any worsening symptoms or concerns 

## 2013-04-12 NOTE — Assessment & Plan Note (Signed)
prob by hx, mild, formal MMSE not done,  to f/u any worsening symptoms or concerns

## 2013-04-12 NOTE — Assessment & Plan Note (Signed)
Etiology unclear, Exam otherwise benign, to check labs as documented, follow with expectant management  

## 2013-06-17 ENCOUNTER — Other Ambulatory Visit: Payer: Self-pay

## 2013-06-17 DIAGNOSIS — Z1231 Encounter for screening mammogram for malignant neoplasm of breast: Secondary | ICD-10-CM

## 2013-07-23 ENCOUNTER — Ambulatory Visit
Admission: RE | Admit: 2013-07-23 | Discharge: 2013-07-23 | Disposition: A | Discharge status: Home / Self Care | Payer: Medicare Other | Source: Ambulatory Visit

## 2013-07-23 DIAGNOSIS — Z1231 Encounter for screening mammogram for malignant neoplasm of breast: Secondary | ICD-10-CM

## 2013-07-29 DIAGNOSIS — H2510 Age-related nuclear cataract, unspecified eye: Secondary | ICD-10-CM | POA: Diagnosis not present

## 2013-07-29 DIAGNOSIS — H04129 Dry eye syndrome of unspecified lacrimal gland: Secondary | ICD-10-CM | POA: Diagnosis not present

## 2013-08-24 ENCOUNTER — Other Ambulatory Visit: Payer: Self-pay | Admitting: Internal Medicine

## 2013-08-25 ENCOUNTER — Ambulatory Visit (INDEPENDENT_AMBULATORY_CARE_PROVIDER_SITE_OTHER): Payer: Medicare Other | Admitting: Internal Medicine

## 2013-08-25 ENCOUNTER — Encounter: Payer: Self-pay | Admitting: Internal Medicine

## 2013-08-25 ENCOUNTER — Ambulatory Visit (INDEPENDENT_AMBULATORY_CARE_PROVIDER_SITE_OTHER)
Admission: RE | Admit: 2013-08-25 | Discharge: 2013-08-25 | Disposition: A | Discharge status: Home / Self Care | Payer: Medicare Other | Source: Ambulatory Visit | Attending: Internal Medicine | Admitting: Internal Medicine

## 2013-08-25 VITALS — BP 120/70 | HR 67 | Temp 97.0°F | Ht <= 58 in | Wt 130.0 lb

## 2013-08-25 DIAGNOSIS — E785 Hyperlipidemia, unspecified: Secondary | ICD-10-CM | POA: Diagnosis not present

## 2013-08-25 DIAGNOSIS — Z23 Encounter for immunization: Secondary | ICD-10-CM | POA: Diagnosis not present

## 2013-08-25 DIAGNOSIS — M79672 Pain in left foot: Secondary | ICD-10-CM | POA: Insufficient documentation

## 2013-08-25 DIAGNOSIS — I10 Essential (primary) hypertension: Secondary | ICD-10-CM | POA: Diagnosis not present

## 2013-08-25 DIAGNOSIS — M79609 Pain in unspecified limb: Secondary | ICD-10-CM

## 2013-08-25 DIAGNOSIS — M19079 Primary osteoarthritis, unspecified ankle and foot: Secondary | ICD-10-CM | POA: Diagnosis not present

## 2013-08-25 NOTE — Patient Instructions (Signed)
You had the flu shot today Please continue all other medications as before, and refills have been done if requested. Please have the pharmacy call with any other refills you may need. Please continue your efforts at being more active, low cholesterol diet, and weight control.  Please go to the XRAY Department in the Basement (go straight as you get off the elevator) for the x-ray testing  You will be contacted by phone if any changes need to be made immediately.  Otherwise, you will receive a letter about your results with an explanation, but please check with MyChart first.  If there is a fracture, you will need to see orthopedic  If there is no fracture, but the pain persists after another 1-2 wks, please call for podiatry referral.

## 2013-08-25 NOTE — Progress Notes (Signed)
Subjective:    Patient ID: Madeline Mcdaniel, female    DOB: 1941-09-30, 72 y.o.   MRN: [id number]  HPI  Here to f/u with distal left foot pain since sept 6 episode where she stumbled a bit, the left foot was caught in place and the rest of the body twisted and fell to the right, catching herself on a wall.  Since then with pain swelling primarily to the distal left foot MTP areas of the great, 2nd and third toes.  No bruising, but pain just not helped with heat pad, ibuprofen, and sometimes quite severe with walking, has to walk on her heel.  No other falls, fever.  Pt denies chest pain, increased sob or doe, wheezing, orthopnea, PND, increased LE swelling, palpitations, dizziness or syncope.   Pt denies polydipsia, polyuria, o Pt states overall good compliance with meds, thouh has some occasional diet noncompliance Past Medical History  Diagnosis Date  . Asthma 04/27/2011  . Degenerative joint disease 04/27/2011  . Bipolar affective disorder 04/27/2011  . Chronic bronchitis 04/27/2011  . GERD (gastroesophageal reflux disease) 04/27/2011  . Allergic rhinitis, cause unspecified 04/27/2011  . HTN (hypertension) 04/27/2011  . Hyperlipemia 04/27/2011  . Chronic cystitis 04/27/2011  . Colon polyps 04/27/2011  . Anemia 04/27/2011  . H/O: hysterectomy 04/27/2011  . TIA (transient ischemic attack) 04/27/2011  . Urinary incontinence 04/27/2011  . 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 Hyperlipidemia 04/27/2011  . Stroke   . Osteopenia 06/01/2012  . Left shoulder pain 06/01/2012    supraspinatous tendonitis, AC joint arthropathy/left impingement syndrome  . Cervical spondylosis 06/01/2012  . Right carpal tunnel syndrome 07/22/2012  . Mild cognitive impairment 04/12/2013   Past Surgical History  Procedure Laterality Date  . Colonoscopy  86YRS AGO    IN [place],PT DOES NOT REMEMBER DOCTOR  . Orthoscopic carpetomy      BOTH THUMBS  . Trigger finger release      LEFT RING FINGER  . Bunionectomy      LEFT FOOT  . Total  abdominal hysterectomy    . Tonsillectomy    . Bladder suspension      A&P REPAIR WITH MESH  . Steriotactic breast biopsy      RIGHT  . Breast lumpectomy      SEVERAL TIMES  . Polypectomy  10 YRS AGO    BENIGN POLYPS X2  . Tendon transfer  12/09/2012    Procedure: TENDON TRANSFER;  Surgeon: Nicki Reaper, MD;  Location[facility];  Service: Orthopedics;  Laterality: Left;  [employer] PIP with FDS Slip Left ring finger, [place]ug[place]not    reports that she has never smoked. She has never used smokeless tobacco. She reports that she does not drink alcohol or use illicit drugs. family history includes Cancer (age of onset: 4) in her mother. There is no history of Colon cancer. Allergies  Allergen Reactions  . Vioxx [Rofecoxib] Anaphylaxis  . Aspirin   . Ciprofloxacin Other (See Comments)    HEADACHE  . Nsaids     BECAUSE OF REACTION TO VIOXX   Current Outpatient Prescriptions on File Prior to Visit  Medication Sig Dispense Refill  . acetaminophen (TYLENOL) 500 MG tablet Take 500 mg by mouth as needed.        Marland Kitchen albuterol (PROVENTIL,VENTOLIN) 90 MCG/ACT inhaler Inhale 2 puffs into the lungs every 4 (four) hours as needed.        . Coenzyme Q10 (CO Q 10 PO) Take 200 mg by mouth daily.        Marland Kitchen  Fluticasone-Salmeterol (ADVAIR DISKUS IN) Inhale 1 Inhaler into the lungs as needed. 100/50      . gabapentin (NEURONTIN) 300 MG capsule TAKE ONE CAPSULE BY MOUTH TWICE DAILY  180 capsule  1  . hydrochlorothiazide (HYDRODIURIL) 12.5 MG tablet Take 1 tablet (12.5 mg total) by mouth daily.  90 tablet  3  . lansoprazole (PREVACID) 30 MG capsule Take 1 capsule (30 mg total) by mouth daily.  90 capsule  3  . lisinopril (PRINIVIL,ZESTRIL) 10 MG tablet Take 1 tablet (10 mg total) by mouth daily.  90 tablet  3  . lithium carbonate 300 MG capsule TAKE 1 CAPSULE BY MOUTH TWICE DAILY .  180 capsule  1  . nitrofurantoin (MACRODANTIN) 50 MG capsule TAKE ONE CAPSULE BY MOUTH DAILY  30 capsule  0   No  current facility-administered medications on file prior to visit.   Review of Systems  Constitutional: Negative for unexpected weight change, or unusual diaphoresis  HENT: Negative for tinnitus.   Eyes: Negative for photophobia and visual disturbance.  Respiratory: Negative for choking and stridor.   Gastrointestinal: Negative for vomiting and blood in stool.  Genitourinary: Negative for hematuria and decreased urine volume.  Musculoskeletal: Negative for acute joint swelling Skin: Negative for color change and wound.  Neurological: Negative for tremors and numbness other than noted  Psychiatric/Behavioral: Negative for decreased concentration or  hyperactivity.       Objective:   Physical Exam BP 120/70  Pulse 67  Temp(Src) 97 F (36.1 C) (Oral)  Ht 4\' 9"  (1.448 m)  Wt 130 lb (58.968 kg)  BMI 28.12 kg/m2  SpO2 96% VS noted, not ill appearing Constitutional: Pt appears well-developed and well-nourished.  HENT: Head: NCAT.  Right Ear: External ear normal.  Left Ear: External ear normal.  Eyes: Conjunctivae and EOM are normal. Pupils are equal, round, and reactive to light.  Neck: Normal range of motion. Neck supple.  Cardiovascular: Normal rate and regular rhythm.   Pulmonary/Chest: Effort normal and breath sounds normal.  Neurological: Pt is alert. Not confused , motor5/5, gait antalgic Skin: Skin is warm. No erythema. No rash Left distal foot with nondiscrete trace to 1+ swelling to MTP's o/w foot neurovasc intact Psychiatric: Pt behavior is normal. Thought content normal.     Assessment & Plan:

## 2013-08-25 NOTE — Assessment & Plan Note (Signed)
stable overall by history and exam, recent data reviewed with pt, and pt to continue medical treatment as before,  to f/u any worsening symptoms or concerns le BP Readings from Last 3 Encounters:  08/25/13 120/70  04/08/13 140/80  12/09/12 126/63

## 2013-08-25 NOTE — Assessment & Plan Note (Signed)
Mild to mod, for lower chol diet,  to f/u lab next visit Lab Results  Component Value Date   LDLCALC 98 04/29/2012

## 2013-08-25 NOTE — Assessment & Plan Note (Signed)
For film today, r/o fx, declines any pain med, for tylenol prn, to ortho if fx, consider podiatry if no fracture and pain persists

## 2013-08-26 ENCOUNTER — Other Ambulatory Visit: Payer: Self-pay | Admitting: Internal Medicine

## 2013-08-26 NOTE — Telephone Encounter (Signed)
Don erx 

## 2013-09-28 ENCOUNTER — Other Ambulatory Visit: Payer: Self-pay | Admitting: Internal Medicine

## 2013-09-30 ENCOUNTER — Telehealth: Payer: Self-pay | Admitting: Internal Medicine

## 2013-09-30 NOTE — Telephone Encounter (Signed)
Since office policy is such as it is, we should ask pt to make ROV

## 2013-09-30 NOTE — Telephone Encounter (Signed)
Called the patient to inform.  She stated she has been on this for years due to chronic cystitis.  The patient stated PCP has been filling without an office visit, but would come in if needed.  Please advise

## 2013-09-30 NOTE — Telephone Encounter (Signed)
Pt called stated her drug store request refill for macrodantin but pt was told to call our office for more info. Pt last ov was 08/25/2013. Please advise. Pt is out of this med.

## 2013-09-30 NOTE — Telephone Encounter (Signed)
Patient informed and did schedule appointment 

## 2013-09-30 NOTE — Telephone Encounter (Signed)
Please consider OV, such as later today

## 2013-10-01 ENCOUNTER — Ambulatory Visit (INDEPENDENT_AMBULATORY_CARE_PROVIDER_SITE_OTHER): Payer: Medicare Other | Admitting: Internal Medicine

## 2013-10-01 ENCOUNTER — Encounter: Payer: Self-pay | Admitting: Internal Medicine

## 2013-10-01 VITALS — BP 130/82 | HR 67 | Temp 97.7°F | Ht <= 58 in | Wt 133.5 lb

## 2013-10-01 DIAGNOSIS — I10 Essential (primary) hypertension: Secondary | ICD-10-CM

## 2013-10-01 DIAGNOSIS — N390 Urinary tract infection, site not specified: Secondary | ICD-10-CM | POA: Diagnosis not present

## 2013-10-01 DIAGNOSIS — K219 Gastro-esophageal reflux disease without esophagitis: Secondary | ICD-10-CM

## 2013-10-01 DIAGNOSIS — Z23 Encounter for immunization: Secondary | ICD-10-CM

## 2013-10-01 MED ORDER — NITROFURANTOIN MACROCRYSTAL 50 MG PO CAPS: ORAL_CAPSULE | ORAL | Status: DC

## 2013-10-01 NOTE — Patient Instructions (Signed)
You had the new Prevnar pneuomonia shot today Please ask Zella Ball to see if your husband can have his as well today Please continue all other medications as before, and refills have been done if requested - the nitrofurantoin Please have the pharmacy call with any other refills you may need.  Please return in May 2015, or sooner if needed,

## 2013-10-04 NOTE — Assessment & Plan Note (Signed)
stable overall by history and exam, recent data reviewed with pt, and pt to continue medical treatment as before,  to f/u any worsening symptoms or concerns BP Readings from Last 3 Encounters:  10/01/13 130/82  08/25/13 120/70  04/08/13 140/80

## 2013-10-04 NOTE — Assessment & Plan Note (Signed)
stable overall by history and exam, recent data reviewed with pt, and pt to continue medical treatment as before,  to f/u any worsening symptoms or concerns Lab Results  Component Value Date   WBC 9.3 04/08/2013   HGB 13.6 04/08/2013   HCT 40.1 04/08/2013   PLT 211.0 04/08/2013   GLUCOSE 86 04/08/2013   CHOL 200 04/08/2013   TRIG 235.0* 04/08/2013   HDL 50.20 04/08/2013   LDLDIRECT 113.9 04/08/2013   LDLCALC 98 04/29/2012   ALT 15 04/08/2013   AST 17 04/08/2013   NA 139 04/08/2013   K 4.7 04/08/2013   CL 105 04/08/2013   CREATININE 0.8 04/08/2013   BUN 18 04/08/2013   CO2 29 04/08/2013   TSH 3.09 04/08/2013

## 2013-10-04 NOTE — Progress Notes (Signed)
Subjective:    Patient ID: Madeline Mcdaniel, female    DOB: [birth year], 72 y.o.   MRN: [id number]  HPI  Here to f/u; overall doing ok,  Pt denies chest pain, increased sob or doe, wheezing, orthopnea, PND, increased LE swelling, palpitations, dizziness or syncope.  Pt denies polydipsia, polyuria, or low sugar symptoms such as weakness or confusion improved with po intake.  Pt denies new neurological symptoms such as new headache, or facial or extremity weakness or numbness.   Pt states overall good compliance with meds, has been trying to follow lower cholesterol diet, with wt overall stable,  but little exercise however.  Denies worsening depressive symptoms, suicidal ideation, or panic; has ongoing anxiety, not increased recently.   Denies urinary symptoms such as dysuria, frequency, urgency, flank pain, hematuria or n/v, fever, chills. Past Medical History  Diagnosis Date  . Asthma 04/27/2011  . Degenerative joint disease 04/27/2011  . Bipolar affective disorder 04/27/2011  . Chronic bronchitis 04/27/2011  . GERD (gastroesophageal reflux disease) 04/27/2011  . Allergic rhinitis, cause unspecified 04/27/2011  . HTN (hypertension) 04/27/2011  . Hyperlipemia 04/27/2011  . Chronic cystitis 04/27/2011  . Colon polyps 04/27/2011  . Anemia 04/27/2011  . H/O: hysterectomy 04/27/2011  . TIA (transient ischemic attack) 04/27/2011  . Urinary incontinence 04/27/2011  . 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 Hyperlipidemia 04/27/2011  . Stroke   . Osteopenia 06/01/2012  . Left shoulder pain 06/01/2012    supraspinatous tendonitis, AC joint arthropathy/left impingement syndrome  . Cervical spondylosis 06/01/2012  . Right carpal tunnel syndrome 07/22/2012  . Mild cognitive impairment 04/12/2013   Past Surgical History  Procedure Laterality Date  . Colonoscopy  66YRS AGO    IN [place],PT DOES NOT REMEMBER DOCTOR  . Orthoscopic carpetomy      BOTH THUMBS  . Trigger finger release      LEFT RING FINGER  . Bunionectomy     LEFT FOOT  . Total abdominal hysterectomy    . Tonsillectomy    . Bladder suspension      A&P REPAIR WITH MESH  . Steriotactic breast biopsy      RIGHT  . Breast lumpectomy      SEVERAL TIMES  . Polypectomy  10 YRS AGO    BENIGN POLYPS X2  . Tendon transfer  12/09/2012    Procedure: TENDON TRANSFER;  Surgeon: Nicki Reaper, MD;  [facility];  Service: Orthopedics;  Laterality: Left;  [employer] PIP with FDS Slip Left ring finger, [place]    reports that she has never smoked. She has never used smokeless tobacco. She reports that she does not drink alcohol or use illicit drugs. family history includes Cancer (age of onset: 91) in her mother. There is no history of Colon cancer. Allergies  Allergen Reactions  . Vioxx [Rofecoxib] Anaphylaxis  . Aspirin   . Ciprofloxacin Other (See Comments)    HEADACHE  . Nsaids     BECAUSE OF REACTION TO VIOXX   Current Outpatient Prescriptions on File Prior to Visit  Medication Sig Dispense Refill  . acetaminophen (TYLENOL) 500 MG tablet Take 500 mg by mouth as needed.        Marland Kitchen albuterol (PROVENTIL,VENTOLIN) 90 MCG/ACT inhaler Inhale 2 puffs into the lungs every 4 (four) hours as needed.        . Coenzyme Q10 (CO Q 10 PO) Take 200 mg by mouth daily.        . Fluticasone-Salmeterol (ADVAIR DISKUS IN) Inhale 1 Inhaler  into the lungs as needed. 100/50      . gabapentin (NEURONTIN) 300 MG capsule TAKE ONE CAPSULE BY MOUTH TWICE DAILY  180 capsule  1  . hydrochlorothiazide (HYDRODIURIL) 12.5 MG tablet Take 1 tablet (12.5 mg total) by mouth daily.  90 tablet  3  . lansoprazole (PREVACID) 30 MG capsule Take 1 capsule (30 mg total) by mouth daily.  90 capsule  3  . lisinopril (PRINIVIL,ZESTRIL) 10 MG tablet Take 1 tablet (10 mg total) by mouth daily.  90 tablet  3  . lithium carbonate 300 MG capsule TAKE ONE CAPSULE BY MOUTH TWICE DAILY  180 capsule  1   No current facility-administered medications on file prior to visit.   Review  of Systems  Constitutional: Negative for unexpected weight change, or unusual diaphoresis  HENT: Negative for tinnitus.   Eyes: Negative for photophobia and visual disturbance.  Respiratory: Negative for choking and stridor.   Gastrointestinal: Negative for vomiting and blood in stool.  Genitourinary: Negative for hematuria and decreased urine volume.  Musculoskeletal: Negative for acute joint swelling Skin: Negative for color change and wound.  Neurological: Negative for tremors and numbness other than noted  Psychiatric/Behavioral: Negative for decreased concentration or  hyperactivity.  '    Objective:   Physical Exam BP 130/82  Pulse 67  Temp(Src) 97.7 F (36.5 C) (Oral)  Ht 4\' 9"  (1.448 m)  Wt 133 lb 8 oz (60.555 kg)  BMI 28.88 kg/m2  SpO2 99% VS noted,  Constitutional: Pt appears well-developed and well-nourished.  HENT: Head: NCAT.  Right Ear: External ear normal.  Left Ear: External ear normal.  Eyes: Conjunctivae and EOM are normal. Pupils are equal, round, and reactive to light.  Neck: Normal range of motion. Neck supple.  Cardiovascular: Normal rate and regular rhythm.   Pulmonary/Chest: Effort normal and breath sounds normal.  Abd:  Soft, NT, non-distended, + BS Neurological: Pt is alert. Not confused  Skin: Skin is warm. No erythema.  Psychiatric: Pt behavior is normal. Thought content normal.     Assessment & Plan:

## 2013-10-04 NOTE — Assessment & Plan Note (Signed)
Currently asympt, to re-start her nitrofurantoin daily, f/u any worsening s/s

## 2013-10-04 NOTE — Assessment & Plan Note (Signed)
stable overall by history and exam, recent data reviewed with pt, and pt to continue medical treatment as before,  to f/u any worsening symptoms or concerns SpO2 Readings from Last 3 Encounters:  10/01/13 99%  08/25/13 96%  04/08/13 95%

## 2013-11-03 ENCOUNTER — Other Ambulatory Visit: Payer: Self-pay | Admitting: Internal Medicine

## 2013-11-03 NOTE — Telephone Encounter (Signed)
Done per emr 

## 2013-12-01 ENCOUNTER — Ambulatory Visit (INDEPENDENT_AMBULATORY_CARE_PROVIDER_SITE_OTHER): Payer: Medicare Other | Admitting: Internal Medicine

## 2013-12-01 ENCOUNTER — Encounter: Payer: Self-pay | Admitting: Internal Medicine

## 2013-12-01 VITALS — BP 130/88 | HR 83 | Temp 98.7°F | Ht <= 58 in | Wt 132.0 lb

## 2013-12-01 DIAGNOSIS — I10 Essential (primary) hypertension: Secondary | ICD-10-CM

## 2013-12-01 DIAGNOSIS — J45901 Unspecified asthma with (acute) exacerbation: Secondary | ICD-10-CM

## 2013-12-01 DIAGNOSIS — J209 Acute bronchitis, unspecified: Secondary | ICD-10-CM

## 2013-12-01 MED ORDER — HYDROCODONE-HOMATROPINE 5-1.5 MG/5ML PO SYRP: 5.0000 mL | ORAL_SOLUTION | Freq: Four times a day (QID) | ORAL | Status: DC | PRN

## 2013-12-01 MED ORDER — AZITHROMYCIN 250 MG PO TABS: ORAL_TABLET | ORAL | Status: DC

## 2013-12-01 MED ORDER — PREDNISONE 10 MG PO TABS: ORAL_TABLET | ORAL | Status: DC

## 2013-12-01 MED ORDER — METHYLPREDNISOLONE ACETATE 80 MG/ML IJ SUSP
80.0000 mg | Freq: Once | INTRAMUSCULAR | Status: AC
  Administered 2013-12-01: 80 mg via INTRAMUSCULAR

## 2013-12-01 MED ORDER — ALBUTEROL SULFATE HFA 108 (90 BASE) MCG/ACT IN AERS: 2.0000 | INHALATION_SPRAY | Freq: Four times a day (QID) | RESPIRATORY_TRACT | Status: DC | PRN

## 2013-12-01 NOTE — Assessment & Plan Note (Signed)
stable overall by history and exam, recent data reviewed with pt, and pt to continue medical treatment as before,  to f/u any worsening symptoms or concerns  BP Readings from Last 3 Encounters:  12/01/13 130/88  10/01/13 130/82  08/25/13 120/70

## 2013-12-01 NOTE — Progress Notes (Signed)
Pre-visit discussion using our clinic review tool. No additional management support is needed unless otherwise documented below in the visit note.  

## 2013-12-01 NOTE — Progress Notes (Signed)
Subjective:    Patient ID: Madeline Mcdaniel, female    DOB: 04-Jul-1941, 72 y.o.   MRN: [id number]  HPI  Here with acute onset mild to mod 2 wks worsening ST, HA, general weakness and malaise, with prod cough greenish sputum, but Pt denies chest pain, increased sob or doe, wheezing, orthopnea, PND, increased LE swelling, palpitations, dizziness or syncope, except for onset small blood in sputum, and wheezing/mild sob x 2 days Past Medical History  Diagnosis Date  . Asthma 04/27/2011  . Degenerative joint disease 04/27/2011  . Bipolar affective disorder 04/27/2011  . Chronic bronchitis 04/27/2011  . GERD (gastroesophageal reflux disease) 04/27/2011  . Allergic rhinitis, cause unspecified 04/27/2011  . HTN (hypertension) 04/27/2011  . Hyperlipemia 04/27/2011  . Chronic cystitis 04/27/2011  . Colon polyps 04/27/2011  . Anemia 04/27/2011  . H/O: hysterectomy 04/27/2011  . TIA (transient ischemic attack) 04/27/2011  . Urinary incontinence 04/27/2011  . 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 Hyperlipidemia 04/27/2011  . Stroke   . Osteopenia 06/01/2012  . Left shoulder pain 06/01/2012    supraspinatous tendonitis, AC joint arthropathy/left impingement syndrome  . Cervical spondylosis 06/01/2012  . Right carpal tunnel syndrome 07/22/2012  . Mild cognitive impairment 04/12/2013   Past Surgical History  Procedure Laterality Date  . Colonoscopy  63YRS AGO    IN [place],PT DOES NOT REMEMBER DOCTOR  . Orthoscopic carpetomy      BOTH THUMBS  . Trigger finger release      LEFT RING FINGER  . Bunionectomy      LEFT FOOT  . Total abdominal hysterectomy    . Tonsillectomy    . Bladder suspension      A&P REPAIR WITH MESH  . Steriotactic breast biopsy      RIGHT  . Breast lumpectomy      SEVERAL TIMES  . Polypectomy  10 YRS AGO    BENIGN POLYPS X2  . Tendon transfer  12/09/2012    Procedure: TENDON TRANSFER;  Surgeon: Nicki Reaper, MD;  Location: [facility];  Service: Orthopedics;  Laterality: Left;   [employer] PIP with FDS Slip Left ring finger, [place]    reports that she has never smoked. She has never used smokeless tobacco. She reports that she does not drink alcohol or use illicit drugs. family history includes Cancer (age of onset: 42) in her mother. There is no history of Colon cancer. Allergies  Allergen Reactions  . Vioxx [Rofecoxib] Anaphylaxis  . Aspirin   . Ciprofloxacin Other (See Comments)    HEADACHE  . Nsaids     BECAUSE OF REACTION TO VIOXX   Current Outpatient Prescriptions on File Prior to Visit  Medication Sig Dispense Refill  . acetaminophen (TYLENOL) 500 MG tablet Take 500 mg by mouth as needed.        Marland Kitchen albuterol (PROVENTIL,VENTOLIN) 90 MCG/ACT inhaler Inhale 2 puffs into the lungs every 4 (four) hours as needed.        . Coenzyme Q10 (CO Q 10 PO) Take 200 mg by mouth daily.        . Fluticasone-Salmeterol (ADVAIR DISKUS IN) Inhale 1 Inhaler into the lungs as needed. 100/50      . gabapentin (NEURONTIN) 300 MG capsule TAKE ONE CAPSULE BY MOUTH TWICE DAILY  180 capsule  1  . hydrochlorothiazide (HYDRODIURIL) 12.5 MG tablet Take 1 tablet (12.5 mg total) by mouth daily.  90 tablet  3  . lansoprazole (PREVACID) 30 MG capsule Take 1 capsule (30 mg  total) by mouth daily.  90 capsule  3  . lisinopril (PRINIVIL,ZESTRIL) 10 MG tablet Take 1 tablet (10 mg total) by mouth daily.  90 tablet  3  . lithium carbonate 300 MG capsule TAKE ONE CAPSULE BY MOUTH TWICE DAILY  180 capsule  1  . nitrofurantoin (MACRODANTIN) 50 MG capsule TAKE ONE CAPSULE BY MOUTH DAILY  90 capsule  3   No current facility-administered medications on file prior to visit.   Review of Systems  Constitutional: Negative for unexpected weight change, or unusual diaphoresis  HENT: Negative for tinnitus.   Eyes: Negative for photophobia and visual disturbance.  Respiratory: Negative for choking and stridor.   Gastrointestinal: Negative for vomiting and blood in stool.  Genitourinary: Negative for  hematuria and decreased urine volume.  Musculoskeletal: Negative for acute joint swelling Skin: Negative for color change and wound.  Neurological: Negative for tremors and numbness other than noted  Psychiatric/Behavioral: Negative for decreased concentration or  hyperactivity.       Objective:   Physical Exam BP 130/88  Pulse 83  Temp(Src) 98.7 F (37.1 C) (Oral)  Ht 4\' 9"  (1.448 m)  Wt 132 lb (59.875 kg)  BMI 28.56 kg/m2  SpO2 99% VS noted, mild ill Constitutional: Pt appears well-developed and well-nourished.  HENT: Head: NCAT.  Right Ear: External ear normal.  Left Ear: External ear normal.  Bilat tm's with mild erythema, left > right.  Max sinus areas non tender.  Pharynx with mild erythema, no exudate Eyes: Conjunctivae and EOM are normal. Pupils are equal, round, and reactive to light.  Neck: Normal range of motion. Neck supple.  Cardiovascular: Normal rate and regular rhythm.   Pulmonary/Chest: Effort normal and breath sounds decrased with bilat few insp wheezes  Neurological: Pt is alert. Not confused  Skin: Skin is warm. No erythema.  Psychiatric: Pt behavior is normal. Thought content normal.     Assessment & Plan:

## 2013-12-01 NOTE — Assessment & Plan Note (Signed)
Mild to mod, for antibx course,  to f/u any worsening symptoms or concerns 

## 2013-12-01 NOTE — Patient Instructions (Addendum)
You had the steroid shot today Please take all new medication as prescribed - the antibiotic, cough medicine, and prednisone Please continue all other medications as before, and refills have been done if requested - the inhaler  Please have the pharmacy call with any other refills you may need.  Please remember to sign up for My Chart if you have not done so, as this will be important to you in the future with finding out test results, communicating by private email, and scheduling acute appointments online when needed.

## 2013-12-01 NOTE — Assessment & Plan Note (Signed)
Mild, for depomedrol IM, predpac asd, alb MDI re-start,  to f/u any worsening symptoms or concerns

## 2014-02-18 ENCOUNTER — Other Ambulatory Visit: Payer: Self-pay | Admitting: Internal Medicine

## 2014-02-18 NOTE — Telephone Encounter (Signed)
Done erx 

## 2014-05-19 ENCOUNTER — Other Ambulatory Visit: Payer: Self-pay | Admitting: Internal Medicine

## 2014-05-31 DIAGNOSIS — K8010 Calculus of gallbladder with chronic cholecystitis without obstruction: Secondary | ICD-10-CM | POA: Diagnosis not present

## 2014-05-31 DIAGNOSIS — Z883 Allergy status to other anti-infective agents status: Secondary | ICD-10-CM | POA: Diagnosis not present

## 2014-05-31 DIAGNOSIS — R1011 Right upper quadrant pain: Secondary | ICD-10-CM | POA: Diagnosis not present

## 2014-05-31 DIAGNOSIS — I498 Other specified cardiac arrhythmias: Secondary | ICD-10-CM | POA: Diagnosis not present

## 2014-05-31 DIAGNOSIS — K5730 Diverticulosis of large intestine without perforation or abscess without bleeding: Secondary | ICD-10-CM | POA: Diagnosis not present

## 2014-05-31 DIAGNOSIS — K819 Cholecystitis, unspecified: Secondary | ICD-10-CM | POA: Diagnosis not present

## 2014-05-31 DIAGNOSIS — R079 Chest pain, unspecified: Secondary | ICD-10-CM | POA: Diagnosis not present

## 2014-05-31 DIAGNOSIS — I10 Essential (primary) hypertension: Secondary | ICD-10-CM | POA: Diagnosis not present

## 2014-05-31 DIAGNOSIS — R1013 Epigastric pain: Secondary | ICD-10-CM | POA: Diagnosis not present

## 2014-05-31 DIAGNOSIS — K449 Diaphragmatic hernia without obstruction or gangrene: Secondary | ICD-10-CM | POA: Diagnosis not present

## 2014-05-31 DIAGNOSIS — Z886 Allergy status to analgesic agent status: Secondary | ICD-10-CM | POA: Diagnosis not present

## 2014-05-31 DIAGNOSIS — K8020 Calculus of gallbladder without cholecystitis without obstruction: Secondary | ICD-10-CM | POA: Diagnosis not present

## 2014-05-31 DIAGNOSIS — F319 Bipolar disorder, unspecified: Secondary | ICD-10-CM | POA: Diagnosis not present

## 2014-05-31 DIAGNOSIS — Z888 Allergy status to other drugs, medicaments and biological substances status: Secondary | ICD-10-CM | POA: Diagnosis not present

## 2014-05-31 DIAGNOSIS — Z79899 Other long term (current) drug therapy: Secondary | ICD-10-CM | POA: Diagnosis not present

## 2014-05-31 DIAGNOSIS — Z87440 Personal history of urinary (tract) infections: Secondary | ICD-10-CM | POA: Diagnosis not present

## 2014-05-31 DIAGNOSIS — K8000 Calculus of gallbladder with acute cholecystitis without obstruction: Secondary | ICD-10-CM | POA: Diagnosis not present

## 2014-05-31 DIAGNOSIS — R109 Unspecified abdominal pain: Secondary | ICD-10-CM | POA: Diagnosis not present

## 2014-06-01 DIAGNOSIS — I10 Essential (primary) hypertension: Secondary | ICD-10-CM | POA: Diagnosis not present

## 2014-06-01 DIAGNOSIS — R1011 Right upper quadrant pain: Secondary | ICD-10-CM | POA: Diagnosis not present

## 2014-06-01 DIAGNOSIS — R079 Chest pain, unspecified: Secondary | ICD-10-CM | POA: Diagnosis not present

## 2014-06-01 DIAGNOSIS — K5730 Diverticulosis of large intestine without perforation or abscess without bleeding: Secondary | ICD-10-CM | POA: Diagnosis not present

## 2014-06-01 DIAGNOSIS — K449 Diaphragmatic hernia without obstruction or gangrene: Secondary | ICD-10-CM | POA: Diagnosis not present

## 2014-06-01 DIAGNOSIS — K8020 Calculus of gallbladder without cholecystitis without obstruction: Secondary | ICD-10-CM | POA: Diagnosis not present

## 2014-06-01 DIAGNOSIS — R109 Unspecified abdominal pain: Secondary | ICD-10-CM | POA: Diagnosis not present

## 2014-06-01 DIAGNOSIS — I498 Other specified cardiac arrhythmias: Secondary | ICD-10-CM | POA: Diagnosis not present

## 2014-06-16 DIAGNOSIS — K812 Acute cholecystitis with chronic cholecystitis: Secondary | ICD-10-CM | POA: Insufficient documentation

## 2014-06-18 ENCOUNTER — Other Ambulatory Visit: Payer: Self-pay

## 2014-06-18 DIAGNOSIS — Z1231 Encounter for screening mammogram for malignant neoplasm of breast: Secondary | ICD-10-CM

## 2014-07-26 ENCOUNTER — Ambulatory Visit
Admission: RE | Admit: 2014-07-26 | Discharge: 2014-07-26 | Disposition: A | Discharge status: Home / Self Care | Payer: Medicare Other | Source: Ambulatory Visit

## 2014-07-26 DIAGNOSIS — Z1231 Encounter for screening mammogram for malignant neoplasm of breast: Secondary | ICD-10-CM | POA: Diagnosis not present

## 2014-07-26 LAB — HM MAMMOGRAPHY

## 2014-08-25 ENCOUNTER — Encounter: Payer: Self-pay | Admitting: Internal Medicine

## 2014-08-25 ENCOUNTER — Other Ambulatory Visit (INDEPENDENT_AMBULATORY_CARE_PROVIDER_SITE_OTHER): Payer: Medicare Other

## 2014-08-25 ENCOUNTER — Ambulatory Visit (INDEPENDENT_AMBULATORY_CARE_PROVIDER_SITE_OTHER): Payer: Medicare Other | Admitting: Internal Medicine

## 2014-08-25 VITALS — BP 118/72 | HR 58 | Temp 98.0°F | Ht <= 58 in | Wt 128.1 lb

## 2014-08-25 DIAGNOSIS — E785 Hyperlipidemia, unspecified: Secondary | ICD-10-CM

## 2014-08-25 DIAGNOSIS — Z23 Encounter for immunization: Secondary | ICD-10-CM

## 2014-08-25 DIAGNOSIS — G3184 Mild cognitive impairment, so stated: Secondary | ICD-10-CM | POA: Diagnosis not present

## 2014-08-25 DIAGNOSIS — M21612 Bunion of left foot: Secondary | ICD-10-CM

## 2014-08-25 DIAGNOSIS — K8020 Calculus of gallbladder without cholecystitis without obstruction: Secondary | ICD-10-CM | POA: Insufficient documentation

## 2014-08-25 DIAGNOSIS — G5600 Carpal tunnel syndrome, unspecified upper limb: Secondary | ICD-10-CM

## 2014-08-25 DIAGNOSIS — M21611 Bunion of right foot: Secondary | ICD-10-CM | POA: Insufficient documentation

## 2014-08-25 DIAGNOSIS — M21619 Bunion of unspecified foot: Secondary | ICD-10-CM

## 2014-08-25 DIAGNOSIS — F319 Bipolar disorder, unspecified: Secondary | ICD-10-CM

## 2014-08-25 DIAGNOSIS — I10 Essential (primary) hypertension: Secondary | ICD-10-CM

## 2014-08-25 DIAGNOSIS — G5601 Carpal tunnel syndrome, right upper limb: Secondary | ICD-10-CM | POA: Insufficient documentation

## 2014-08-25 LAB — CBC WITH DIFFERENTIAL/PLATELET
BASOS ABS: 0 10*3/uL (ref 0.0–0.1)
BASOS PCT: 0.5 % (ref 0.0–3.0)
EOS ABS: 0.2 10*3/uL (ref 0.0–0.7)
Eosinophils Relative: 2.6 % (ref 0.0–5.0)
HCT: 41.8 % (ref 36.0–46.0)
HEMOGLOBIN: 14 g/dL (ref 12.0–15.0)
LYMPHS ABS: 2.3 10*3/uL (ref 0.7–4.0)
Lymphocytes Relative: 26.4 % (ref 12.0–46.0)
MCHC: 33.5 g/dL (ref 30.0–36.0)
MCV: 86.5 fl (ref 78.0–100.0)
Monocytes Absolute: 0.6 10*3/uL (ref 0.1–1.0)
Monocytes Relative: 7.3 % (ref 3.0–12.0)
NEUTROS ABS: 5.6 10*3/uL (ref 1.4–7.7)
Neutrophils Relative %: 63.2 % (ref 43.0–77.0)
Platelets: 208 10*3/uL (ref 150.0–400.0)
RBC: 4.83 Mil/uL (ref 3.87–5.11)
RDW: 13.4 % (ref 11.5–15.5)
WBC: 8.8 10*3/uL (ref 4.0–10.5)

## 2014-08-25 LAB — LIPID PANEL
Cholesterol: 194 mg/dL (ref 0–200)
HDL: 51.3 mg/dL (ref >39.00)
LDL Cholesterol: 109 mg/dL — ABNORMAL HIGH (ref 0–99)
NONHDL: 142.7
Total CHOL/HDL Ratio: 4
Triglycerides: 167 mg/dL — ABNORMAL HIGH (ref 0.0–149.0)
VLDL: 33.4 mg/dL (ref 0.0–40.0)

## 2014-08-25 LAB — BASIC METABOLIC PANEL
BUN: 19 mg/dL (ref 6–23)
CALCIUM: 10 mg/dL (ref 8.4–10.5)
CO2: 29 meq/L (ref 19–32)
Chloride: 107 mEq/L (ref 96–112)
Creatinine, Ser: 0.9 mg/dL (ref 0.4–1.2)
GFR: 66.62 mL/min (ref >60.00)
Glucose, Bld: 101 mg/dL — ABNORMAL HIGH (ref 70–99)
Potassium: 4.1 mEq/L (ref 3.5–5.1)
SODIUM: 140 meq/L (ref 135–145)

## 2014-08-25 LAB — URINALYSIS, ROUTINE W REFLEX MICROSCOPIC
Bilirubin Urine: NEGATIVE
HGB URINE DIPSTICK: NEGATIVE
Ketones, ur: NEGATIVE
Nitrite: NEGATIVE
SPECIFIC GRAVITY, URINE: 1.015 (ref 1.000–1.030)
Total Protein, Urine: NEGATIVE
UROBILINOGEN UA: 0.2 (ref 0.0–1.0)
Urine Glucose: NEGATIVE
pH: 6 (ref 5.0–8.0)

## 2014-08-25 LAB — HEPATIC FUNCTION PANEL
ALK PHOS: 72 U/L (ref 39–117)
ALT: 17 U/L (ref 0–35)
AST: 18 U/L (ref 0–37)
Albumin: 4.4 g/dL (ref 3.5–5.2)
Bilirubin, Direct: 0.2 mg/dL (ref 0.0–0.3)
TOTAL PROTEIN: 7.5 g/dL (ref 6.0–8.3)
Total Bilirubin: 1.4 mg/dL — ABNORMAL HIGH (ref 0.2–1.2)

## 2014-08-25 LAB — TSH: TSH: 2.2 u[IU]/mL (ref 0.35–4.50)

## 2014-08-25 MED ORDER — DONEPEZIL HCL 5 MG PO TABS: 5.0000 mg | ORAL_TABLET | Freq: Every day | ORAL | Status: DC

## 2014-08-25 NOTE — Assessment & Plan Note (Addendum)
stable overall by history and exam, recent data reviewed with pt, and pt to continue medical treatment as before,  to f/u any worsening symptoms or concerns Lab Results  Component Value Date   LDLCALC 98 04/29/2012   for f/u labs, low chol diet  Note:  Total time for pt hx, exam, review of record with pt in the room, determination of diagnoses and plan for further eval and tx is > 40 min, with over 50% spent in coordination and counseling of patient

## 2014-08-25 NOTE — Progress Notes (Signed)
Subjective:    Patient ID: Madeline Mcdaniel, female    DOB: [birth year], 73 y.o.   MRN: [id number]  HPI  Here for yearly f/u;  Overall doing ok;  Pt denies CP, worsening SOB, DOE, wheezing, orthopnea, PND, worsening LE edema, palpitations, dizziness or syncope.  Pt denies neurological change such as new headache, facial or extremity weakness.  Pt denies polydipsia, polyuria, or low sugar symptoms. Pt states overall good compliance with treatment and medications, good tolerability, and has been trying to follow lower cholesterol diet.. No fever, night sweats, wt loss, loss of appetite, or other constitutional symptoms.  Pt states good ability with ADL's, has low fall risk, home safety reviewed and adequate, no other significant changes in hearing or vision, and only occasionally active with exercise. Had an significant episode of trembling legs, general weakness, thought she was going to fall, husband (a pharmacist) though she might be on too much medication, so pt stopped everything, then became worse depressed soon after (started approx 2 mo ago), Denies suicidal ideation, or panic.  Since then re-started all meds gradually over time starting soon after onset depression, and no episodes further weakness so far. Has some occas neck stiffness with achiness towards the end of the day, some grinding involved, head seems to lean to the right more lately, husband gets after her. Neck brace support helps but cannot wear at night.  Had dx of gallstones per [facility] in July, s/p ccx.  Also has ongoing right wrist pain, NCS c/w CTS, was better but now worse again with stinging, Bunions are acting up with more pain and swelling, asks for podiatry referral.  Seems to have more memory issues recently- forgot to turn off stove recently, hard to remember names of persons outside her family. Past Medical History  Diagnosis Date  . Asthma 04/27/2011  . Degenerative joint disease 04/27/2011  . Bipolar affective disorder  04/27/2011  . Chronic bronchitis 04/27/2011  . GERD (gastroesophageal reflux disease) 04/27/2011  . Allergic rhinitis, cause unspecified 04/27/2011  . HTN (hypertension) 04/27/2011  . Hyperlipemia 04/27/2011  . Chronic cystitis 04/27/2011  . Colon polyps 04/27/2011  . Anemia 04/27/2011  . H/O: hysterectomy 04/27/2011  . TIA (transient ischemic attack) 04/27/2011  . Urinary incontinence 04/27/2011  . 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 Hyperlipidemia 04/27/2011  . Stroke   . Osteopenia 06/01/2012  . Left shoulder pain 06/01/2012    supraspinatous tendonitis, AC joint arthropathy/left impingement syndrome  . Cervical spondylosis 06/01/2012  . Right carpal tunnel syndrome 07/22/2012  . Mild cognitive impairment 04/12/2013   Past Surgical History  Procedure Laterality Date  . Colonoscopy  54YRS AGO    IN [place],PT DOES NOT REMEMBER DOCTOR  . Orthoscopic carpetomy      BOTH THUMBS  . Trigger finger release      LEFT RING FINGER  . Bunionectomy      LEFT FOOT  . Total abdominal hysterectomy    . Tonsillectomy    . Bladder suspension      A&P REPAIR WITH MESH  . Steriotactic breast biopsy      RIGHT  . Breast lumpectomy      SEVERAL TIMES  . Polypectomy  10 YRS AGO    BENIGN POLYPS X2  . Tendon transfer  12/09/2012    Procedure: TENDON TRANSFER;  Surgeon: Wynonia Sours, MD;  Location: [facility];  Service: Orthopedics;  Laterality: Left;  Tenodesis PIP with FDS Slip Left ring finger, Juggerknot  . Cholecystectomy  reports that she has never smoked. She has never used smokeless tobacco. She reports that she does not drink alcohol or use illicit drugs. family history includes Cancer (age of onset: 10) in her mother. There is no history of Colon cancer. Allergies  Allergen Reactions  . Vioxx [Rofecoxib] Anaphylaxis  . Aspirin   . Ciprofloxacin Other (See Comments)    HEADACHE  . Nsaids     BECAUSE OF REACTION TO VIOXX   Current Outpatient Prescriptions on File Prior to Visit    Medication Sig Dispense Refill  . acetaminophen (TYLENOL) 500 MG tablet Take 500 mg by mouth as needed.        Marland Kitchen albuterol (PROVENTIL HFA;VENTOLIN HFA) 108 (90 BASE) MCG/ACT inhaler Inhale 2 puffs into the lungs every 6 (six) hours as needed for wheezing or shortness of breath.  1 Inhaler  5  . Coenzyme Q10 (CO Q 10 PO) Take 200 mg by mouth daily.        . Fluticasone-Salmeterol (ADVAIR DISKUS IN) Inhale 1 Inhaler into the lungs as needed. 100/50      . gabapentin (NEURONTIN) 300 MG capsule TAKE ONE CAPSULE BY MOUTH TWICE DAILY  180 capsule  1  . hydrochlorothiazide (HYDRODIURIL) 12.5 MG tablet Take 1 tablet (12.5 mg total) by mouth daily.  90 tablet  3  . lansoprazole (PREVACID) 30 MG capsule Take 1 capsule (30 mg total) by mouth daily.  90 capsule  3  . lisinopril (PRINIVIL,ZESTRIL) 10 MG tablet Take 1 tablet (10 mg total) by mouth daily.  90 tablet  3  . lithium carbonate 300 MG capsule TAKE ONE CAPSULE BY MOUTH TWICE DAILY  180 capsule  1  . nitrofurantoin (MACRODANTIN) 50 MG capsule TAKE ONE CAPSULE BY MOUTH DAILY  90 capsule  3   No current facility-administered medications on file prior to visit.    Review of Systems Constitutional: Negative for increased diaphoresis, other activity, appetite or other siginficant weight change  HENT: Negative for worsening hearing loss, ear pain, facial swelling, mouth sores and neck stiffness.   Eyes: Negative for other worsening pain, redness or visual disturbance.  Respiratory: Negative for shortness of breath and wheezing.   Cardiovascular: Negative for chest pain and palpitations.  Gastrointestinal: Negative for diarrhea, blood in stool, abdominal distention or other pain Genitourinary: Negative for hematuria, flank pain or change in urine volume.  Musculoskeletal: Negative for myalgias or other joint complaints.  Skin: Negative for color change and wound.  Neurological: Negative for syncope and numbness. other than noted Hematological:  Negative for adenopathy. or other swelling Psychiatric/Behavioral: Negative for hallucinations, self-injury, decreased concentration or other worsening agitation.      Objective:   Physical Exam BP 118/72  Pulse 58  Temp(Src) 98 F (36.7 C) (Oral)  Ht 4\' 10"  (1.473 m)  Wt 128 lb 2 oz (58.117 kg)  BMI 26.79 kg/m2  SpO2 96% VS noted,  Constitutional: Pt is oriented to person, place, and time. Appears well-developed and well-nourished.  Head: Normocephalic and atraumatic.  Right Ear: External ear normal.  Left Ear: External ear normal.  Nose: Nose normal.  Mouth/Throat: Oropharynx is clear and moist.  Eyes: Conjunctivae and EOM are normal. Pupils are equal, round, and reactive to light.  Neck: Normal range of motion. Neck supple. No JVD present. No tracheal deviation present.  Cardiovascular: Normal rate, regular rhythm, normal heart sounds and intact distal pulses.   Pulmonary/Chest: Effort normal and breath sounds without rales or wheezing  Abdominal: Soft. Bowel sounds are  normal. NT. No HSM  Musculoskeletal: Normal range of motion. Exhibits no edema.  Lymphadenopathy:  Has no cervical adenopathy.  Neurological: Pt is alert and oriented to person, place, and time. Pt has normal reflexes. No cranial nerve deficit. Motor grossly intact Skin: Skin is warm and dry. No rash noted.  Psychiatric:  Has normal mood and affect. Behavior is normal.  Has  bilat tender swollen bunions, already s/p surgury on right     Assessment & Plan:

## 2014-08-25 NOTE — Assessment & Plan Note (Signed)
For right wrist splint at night only, until improved

## 2014-08-25 NOTE — Assessment & Plan Note (Signed)
stable overall by history and exam, recent data reviewed with pt, and pt to continue medical treatment as before,  to f/u any worsening symptoms or concerns BP Readings from Last 3 Encounters:  08/25/14 118/72  12/01/13 130/88  10/01/13 130/82

## 2014-08-25 NOTE — Assessment & Plan Note (Signed)
Mild to mod sympt, for podiatry referral

## 2014-08-25 NOTE — Assessment & Plan Note (Signed)
?   Mild worsening, for aricept 5 qd

## 2014-08-25 NOTE — Progress Notes (Signed)
Pre visit review using our clinic review tool, if applicable. No additional management support is needed unless otherwise documented below in the visit note. 

## 2014-08-25 NOTE — Patient Instructions (Addendum)
You had the flu shot today  Please wear a right wrist splint at night  You will be contacted regarding the referral for: podiatry  Please take all new medication as prescribed - the aricept 5 qd  Please continue all other medications as before, and refills have been done if requested.  Please have the pharmacy call with any other refills you may need.  Please continue your efforts at being more active, low cholesterol diet, and weight control.  You are otherwise up to date with prevention measures today.  Please keep your appointments with your specialists as you may have planned  Please go to the LAB in the Basement (turn left off the elevator) for the tests to be done today  You will be contacted by phone if any changes need to be made immediately.  Otherwise, you will receive a letter about your results with an explanation, but please check with MyChart first.  Please remember to sign up for MyChart if you have not done so, as this will be important to you in the future with finding out test results, communicating by private email, and scheduling acute appointments online when needed.  Please return in 6 months, or sooner if needed

## 2014-08-26 LAB — LITHIUM LEVEL: Lithium Lvl: 0.6 mEq/L — ABNORMAL LOW (ref 0.80–1.40)

## 2014-08-30 ENCOUNTER — Other Ambulatory Visit: Payer: Self-pay | Admitting: Internal Medicine

## 2014-08-31 ENCOUNTER — Ambulatory Visit (INDEPENDENT_AMBULATORY_CARE_PROVIDER_SITE_OTHER): Payer: Medicare Other | Admitting: Podiatry

## 2014-08-31 ENCOUNTER — Encounter: Payer: Self-pay | Admitting: Podiatry

## 2014-08-31 VITALS — BP 141/91 | HR 63 | Ht <= 58 in | Wt 128.0 lb

## 2014-08-31 DIAGNOSIS — M79609 Pain in unspecified limb: Secondary | ICD-10-CM

## 2014-08-31 DIAGNOSIS — M2010 Hallux valgus (acquired), unspecified foot: Secondary | ICD-10-CM | POA: Insufficient documentation

## 2014-08-31 DIAGNOSIS — M21619 Bunion of unspecified foot: Secondary | ICD-10-CM

## 2014-08-31 DIAGNOSIS — M79606 Pain in leg, unspecified: Secondary | ICD-10-CM | POA: Insufficient documentation

## 2014-08-31 DIAGNOSIS — M21611 Bunion of right foot: Secondary | ICD-10-CM

## 2014-08-31 DIAGNOSIS — M21612 Bunion of left foot: Secondary | ICD-10-CM

## 2014-08-31 DIAGNOSIS — B351 Tinea unguium: Secondary | ICD-10-CM | POA: Insufficient documentation

## 2014-08-31 DIAGNOSIS — M79605 Pain in left leg: Secondary | ICD-10-CM

## 2014-08-31 NOTE — Progress Notes (Signed)
Subjective: 73 year old female presents complaining of pain in bunion on both feet. Has had bunion surgery on left side years ago. Now both feet hurt R>L. Having difficulty walking due to pain. Wearing custom orthotics x 10 years. Patient wants to have a new pair of orthotics and the left bunion be fixed.   Review of Systems - General ROS: negative for - chills, fatigue, fever, hot flashes, night sweats or sleep disturbance Ophthalmic ROS: Wearing corrective lens and developing Cataract.  ENT ROS: negative Allergy and Immunology ROS: Aspirin or NSAID. Breast ROS: negative for breast lumps Respiratory ROS: no cough, shortness of breath, or wheezing Cardiovascular ROS: no chest pain or dyspnea on exertion Gastrointestinal ROS: no abdominal pain, change in bowel habits, or black or bloody stools Genito-Urinary ROS: Frequent bladder infection controlled with Macrodantin.  Musculoskeletal ROS: Having neck problem painful. Possible from old stroke.  Neurological ROS: Has Bipolar illness, under control with medication for the past 20+ years.  Dermatological ROS: negative.   Objective: Neurovascular status are within normal.  Severe HAV with bunion bilateral.  Pain at 1st Metatarsal head medial aspect L>R. Radiographic examination reveal post surgical left slim metatarsal head with medially protruding articular surface. Fibular sesamoid position at 6.  Right foot has HAV with bunion with Fibular sesamoid position at 6.   Assessment: Severe HAV with bunion symptomatic bilateral.   Plan: Reviewed clinical findings and available treatment options. Both feet casted for Orthotics. Will de McBride bunionectomy left foot as per request.

## 2014-08-31 NOTE — Patient Instructions (Signed)
Seen for painful bunions.  Casted for Orthotics. Pre op done for McBride bunionectomy left foot.  Call the office if there is any further questions regard to foot surgery.

## 2014-09-09 DIAGNOSIS — M2012 Hallux valgus (acquired), left foot: Secondary | ICD-10-CM | POA: Diagnosis not present

## 2014-09-09 DIAGNOSIS — G5762 Lesion of plantar nerve, left lower limb: Secondary | ICD-10-CM | POA: Diagnosis not present

## 2014-09-09 HISTORY — PX: NEURECTOMY FOOT: SUR888

## 2014-09-09 HISTORY — PX: OTHER SURGICAL HISTORY: SHX169

## 2014-09-14 ENCOUNTER — Ambulatory Visit (INDEPENDENT_AMBULATORY_CARE_PROVIDER_SITE_OTHER): Payer: Medicare Other | Admitting: Podiatry

## 2014-09-14 ENCOUNTER — Encounter: Payer: Self-pay | Admitting: Podiatry

## 2014-09-14 VITALS — BP 135/72 | HR 57

## 2014-09-14 DIAGNOSIS — M2011 Hallux valgus (acquired), right foot: Secondary | ICD-10-CM

## 2014-09-14 NOTE — Patient Instructions (Signed)
Post op wound. Clean and dry. Healing well. Dressing changed. Return in one week.

## 2014-09-14 NOTE — Progress Notes (Signed)
5 days post op following McBride bunionectomy and Neurectomy dorsomedial cutaneous N.  Patient denies fever or chill. Denies any discomfort. Occasional stinging sensation after been on feet much. Dressing is clean and intact. Wound is dry and well coapted. Mild redness in skin from tight bandage. Satisfactory post op wound without complications. Wound cleansed with Iodine and redressed.

## 2014-09-17 ENCOUNTER — Other Ambulatory Visit: Payer: Self-pay | Admitting: Internal Medicine

## 2014-09-17 NOTE — Telephone Encounter (Signed)
Done erx 

## 2014-09-21 ENCOUNTER — Ambulatory Visit (INDEPENDENT_AMBULATORY_CARE_PROVIDER_SITE_OTHER): Payer: Medicare Other | Admitting: Podiatry

## 2014-09-21 ENCOUNTER — Encounter: Payer: Self-pay | Admitting: Podiatry

## 2014-09-21 DIAGNOSIS — M2012 Hallux valgus (acquired), left foot: Secondary | ICD-10-CM

## 2014-09-21 DIAGNOSIS — M79672 Pain in left foot: Secondary | ICD-10-CM

## 2014-09-21 NOTE — Progress Notes (Signed)
2 weeks post op visit following McBride bunionectomy left foot. Wound healing is good. No edema or erythema noted. No pain complained.  Well healed surgical site with normal wound healing. Suture removed. Tube foam placed with toe separator.  Return in 2 weeks.

## 2014-09-21 NOTE — Patient Instructions (Signed)
Post op wound healing good. Sutures removed. Return in 2 weeks.

## 2014-10-05 ENCOUNTER — Encounter: Payer: Self-pay | Admitting: Podiatry

## 2014-10-05 ENCOUNTER — Ambulatory Visit (INDEPENDENT_AMBULATORY_CARE_PROVIDER_SITE_OTHER): Payer: Medicare Other | Admitting: Podiatry

## 2014-10-05 VITALS — BP 121/79 | HR 59

## 2014-10-05 DIAGNOSIS — M2012 Hallux valgus (acquired), left foot: Secondary | ICD-10-CM

## 2014-10-05 NOTE — Progress Notes (Signed)
4 weeks following McBride bunionectomy left foot. Patient denies any discomfort. Wound healing is good. No edema or erythema noted. No pain complained.  Well healed surgical site with normal wound healing. Tube foam placed with toe separator.  Ok to wear regular shoes.  Return as needed.

## 2014-10-05 NOTE — Patient Instructions (Signed)
Did well after bunion surgery. OK to wear regular shoes. Return as needed.

## 2014-10-15 ENCOUNTER — Telehealth: Payer: Self-pay | Admitting: Internal Medicine

## 2014-10-15 DIAGNOSIS — F310 Bipolar disorder, current episode hypomanic: Secondary | ICD-10-CM

## 2014-10-15 NOTE — Telephone Encounter (Signed)
Referral has been done

## 2014-10-15 NOTE — Telephone Encounter (Signed)
Patient is requesting referral to be sent to someone who will prescribe phychiatric meds.   States Dr. Jenny Reichmann has suggested this to her.  She tried to find someone on her own but could not.

## 2014-10-18 ENCOUNTER — Other Ambulatory Visit: Payer: Self-pay | Admitting: Internal Medicine

## 2014-10-21 ENCOUNTER — Encounter: Payer: Self-pay | Admitting: Internal Medicine

## 2014-11-29 ENCOUNTER — Other Ambulatory Visit: Payer: Self-pay | Admitting: Internal Medicine

## 2014-12-28 ENCOUNTER — Encounter: Payer: Self-pay | Admitting: Internal Medicine

## 2014-12-28 ENCOUNTER — Ambulatory Visit (INDEPENDENT_AMBULATORY_CARE_PROVIDER_SITE_OTHER): Payer: Medicare Other | Admitting: Internal Medicine

## 2014-12-28 VITALS — BP 120/80 | HR 62 | Temp 98.2°F | Ht <= 58 in | Wt 123.2 lb

## 2014-12-28 DIAGNOSIS — I10 Essential (primary) hypertension: Secondary | ICD-10-CM | POA: Diagnosis not present

## 2014-12-28 DIAGNOSIS — F319 Bipolar disorder, unspecified: Secondary | ICD-10-CM

## 2014-12-28 DIAGNOSIS — R609 Edema, unspecified: Secondary | ICD-10-CM

## 2014-12-28 MED ORDER — GABAPENTIN 300 MG PO CAPS: ORAL_CAPSULE | ORAL | Status: DC

## 2014-12-28 MED ORDER — LISINOPRIL 10 MG PO TABS: ORAL_TABLET | ORAL | Status: DC

## 2014-12-28 NOTE — Assessment & Plan Note (Signed)
None today, ok to follow, owuld not use diuretic as with lower BP recent, would likeloy exacrebate

## 2014-12-28 NOTE — Addendum Note (Signed)
Addended by: Biagio Borg on: 12/28/2014 04:57 PM   Modules accepted: Orders, Medications, SmartSet

## 2014-12-28 NOTE — Progress Notes (Signed)
Pre visit review using our clinic review tool, if applicable. No additional management support is needed unless otherwise documented below in the visit note. 

## 2014-12-28 NOTE — Assessment & Plan Note (Signed)
Borderline low by hx at times, for change lsinopril form 10 qam to 5 bid,  to f/u any worsening symptoms or concerns

## 2014-12-28 NOTE — Progress Notes (Signed)
Subjective:    Patient ID: Madeline Mcdaniel, female    DOB: Apr 09, 1941, 74 y.o.   MRN: [id number]  HPI  Here to f/u with BP more labile recently to the low side, not the high, checks BP at home, has been occas sbp as low as 100, with fatigue, feeling poorly, dizziness, felt even close to passing out but mentioned to husband who thought she looked ok; also with HA that resolved by next am. Felt her balance issue might be related to aricept so stopped this for the past wk, also had bad dreams and visual hallucinations she thinks might have been related as well, no pior hx of psychosis, and none since stopped the aricept.  Wt overall stable but Has lost 5 lbs recent. Has felt better in the past day with taking the lisinopril 10 mg at 1/2 bid instead of qam. Did have neurontin increased to 300 am, 600 pm for bipolar, not dizzy or wobbly with this. Had to get off her lithium she had been on for yrs, somewhat difficult to get med regulated.  Feels occas swelling to feet, legs and abd, no swelling today, wears support stocking. BP Readings from Last 3 Encounters:  12/28/14 120/80  10/05/14 121/79  09/14/14 135/72   Wt Readings from Last 3 Encounters:  12/28/14 123 lb 4 oz (55.906 kg)  08/31/14 128 lb (58.06 kg)  08/25/14 128 lb 2 oz (58.117 kg)   Past Medical History  Diagnosis Date  . Asthma 04/27/2011  . Degenerative joint disease 04/27/2011  . Bipolar affective disorder 04/27/2011  . Chronic bronchitis 04/27/2011  . GERD (gastroesophageal reflux disease) 04/27/2011  . Allergic rhinitis, cause unspecified 04/27/2011  . HTN (hypertension) 04/27/2011  . Hyperlipemia 04/27/2011  . Chronic cystitis 04/27/2011  . Colon polyps 04/27/2011  . Anemia 04/27/2011  . H/O: hysterectomy 04/27/2011  . TIA (transient ischemic attack) 04/27/2011  . Urinary incontinence 04/27/2011  . 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 Hyperlipidemia 04/27/2011  . Stroke   . Osteopenia 06/01/2012  . Left shoulder pain 06/01/2012    supraspinatous  tendonitis, AC joint arthropathy/left impingement syndrome  . Cervical spondylosis 06/01/2012  . Right carpal tunnel syndrome 07/22/2012  . Mild cognitive impairment 04/12/2013   Past Surgical History  Procedure Laterality Date  . Colonoscopy  44YRS AGO    IN [place],PT DOES NOT REMEMBER DOCTOR  . Orthoscopic carpetomy      BOTH THUMBS  . Trigger finger release      LEFT RING FINGER  . Bunionectomy      LEFT FOOT  . Total abdominal hysterectomy    . Tonsillectomy    . Bladder suspension      A&P REPAIR WITH MESH  . Steriotactic breast biopsy      RIGHT  . Breast lumpectomy      SEVERAL TIMES  . Polypectomy  10 YRS AGO    BENIGN POLYPS X2  . Tendon transfer  12/09/2012    Procedure: TENDON TRANSFER;  Surgeon: Wynonia Sours, MD;  Location: [facility];  Service: Orthopedics;  Laterality: Left;  Tenodesis PIP with FDS Slip Left ring finger, Juggerknot  . Cholecystectomy    . Mcbride bunionectomy Left 09/09/2014    @ [facility]  . Neurectomy foot Left 09/09/2014    @ [place]    reports that she has never smoked. She has never used smokeless tobacco. She reports that she does not drink alcohol or use illicit drugs. family history includes Cancer (age  of onset: 61) in her mother. There is no history of Colon cancer. Allergies  Allergen Reactions  . Vioxx [Rofecoxib] Anaphylaxis  . Aspirin   . Ciprofloxacin Other (See Comments)    HEADACHE  . Nsaids     BECAUSE OF REACTION TO VIOXX   Current Outpatient Prescriptions on File Prior to Visit  Medication Sig Dispense Refill  . acetaminophen (TYLENOL) 500 MG tablet Take 500 mg by mouth as needed.      Marland Kitchen albuterol (PROVENTIL HFA;VENTOLIN HFA) 108 (90 BASE) MCG/ACT inhaler Inhale 2 puffs into the lungs every 6 (six) hours as needed for wheezing or shortness of breath. 1 Inhaler 5  . Coenzyme Q10 (CO Q 10 PO) Take 200 mg by mouth daily.      . Fluticasone-Salmeterol (ADVAIR DISKUS IN) Inhale 1 Inhaler into the lungs  as needed. 100/50    . hydrochlorothiazide (HYDRODIURIL) 12.5 MG tablet TAKE 1 TABLET BY MOUTH DAILY 90 tablet 1  . lansoprazole (PREVACID) 30 MG capsule Take 1 capsule (30 mg total) by mouth daily. 90 capsule 3  . lisinopril (PRINIVIL,ZESTRIL) 10 MG tablet Take 1 tablet (10 mg total) by mouth daily. 90 tablet 0  . nitrofurantoin (MACRODANTIN) 50 MG capsule TAKE ONE CAPSULE BY MOUTH DAILY 90 capsule 0   No current facility-administered medications on file prior to visit.   Review of Systems  Constitutional: Negative for unusual diaphoresis or other sweats  HENT: Negative for ringing in ear Eyes: Negative for double vision or worsening visual disturbance.  Respiratory: Negative for choking and stridor.   Gastrointestinal: Negative for vomiting or other signifcant bowel change Genitourinary: Negative for hematuria or decreased urine volume.  Musculoskeletal: Negative for other MSK pain or swelling Skin: Negative for color change and worsening wound.  Neurological: Negative for tremors and numbness other than noted  Psychiatric/Behavioral: Negative for decreased concentration or agitation other than above       Objective:   Physical Exam BP 120/80 mmHg  Pulse 62  Temp(Src) 98.2 F (36.8 C) (Oral)  Ht 4\' 10"  (1.473 m)  Wt 123 lb 4 oz (55.906 kg)  BMI 25.77 kg/m2  SpO2 95% VS noted,  Constitutional: Pt appears well-developed, well-nourished.  HENT: Head: NCAT.  Right Ear: External ear normal.  Left Ear: External ear normal.  Eyes: . Pupils are equal, round, and reactive to light. Conjunctivae and EOM are normal Neck: Normal range of motion. Neck supple.  Cardiovascular: Normal rate and regular rhythm.   Pulmonary/Chest: Effort normal and breath sounds without rales or wheezing.  Abd:  Soft, NT, ND, + BS Neurological: Pt is alert. Not confused , motor intact 5/5 Skin: Skin is warm. No rash Psychiatric: Pt behavior is normal. No agitation.     Assessment & Plan:

## 2014-12-28 NOTE — Assessment & Plan Note (Signed)
With ? Recent onset brief psychosis, none now, no SI or HI, for f/u psychiatry if recurs

## 2014-12-28 NOTE — Patient Instructions (Addendum)
Ok to continue the lisinopril 10 mg at Half pill twice per day as you do  OK to stay off the aricept  Please see your psychiatrist if you have further hallucinations  Please continue all other medications as before, and refills have been done if requested.  Please have the pharmacy call with any other refills you may need.  Please keep your appointments with your specialists as you may have planned  Please return in 3 months, or sooner if needed

## 2015-02-01 ENCOUNTER — Other Ambulatory Visit: Payer: Self-pay | Admitting: Internal Medicine

## 2015-02-02 NOTE — Telephone Encounter (Signed)
Done erx  - the nitrofurantoin

## 2015-02-11 DIAGNOSIS — H2513 Age-related nuclear cataract, bilateral: Secondary | ICD-10-CM | POA: Diagnosis not present

## 2015-02-11 DIAGNOSIS — H04123 Dry eye syndrome of bilateral lacrimal glands: Secondary | ICD-10-CM | POA: Diagnosis not present

## 2015-03-01 ENCOUNTER — Encounter: Payer: Self-pay | Admitting: *Deleted

## 2015-03-14 ENCOUNTER — Other Ambulatory Visit: Payer: Self-pay | Admitting: Internal Medicine

## 2015-03-23 DIAGNOSIS — N3941 Urge incontinence: Secondary | ICD-10-CM | POA: Diagnosis not present

## 2015-03-23 DIAGNOSIS — N3000 Acute cystitis without hematuria: Secondary | ICD-10-CM | POA: Diagnosis not present

## 2015-03-23 DIAGNOSIS — N3090 Cystitis, unspecified without hematuria: Secondary | ICD-10-CM | POA: Diagnosis not present

## 2015-03-29 ENCOUNTER — Ambulatory Visit: Payer: Medicare Other | Admitting: Internal Medicine

## 2015-04-06 ENCOUNTER — Other Ambulatory Visit (INDEPENDENT_AMBULATORY_CARE_PROVIDER_SITE_OTHER): Payer: Medicare Other

## 2015-04-06 ENCOUNTER — Encounter: Payer: Self-pay | Admitting: Cardiology

## 2015-04-06 ENCOUNTER — Encounter: Payer: Self-pay | Admitting: *Deleted

## 2015-04-06 ENCOUNTER — Ambulatory Visit (INDEPENDENT_AMBULATORY_CARE_PROVIDER_SITE_OTHER): Payer: Medicare Other | Admitting: Cardiology

## 2015-04-06 VITALS — BP 134/82 | HR 69 | Ht <= 58 in | Wt 118.0 lb

## 2015-04-06 DIAGNOSIS — R0600 Dyspnea, unspecified: Secondary | ICD-10-CM

## 2015-04-06 DIAGNOSIS — I10 Essential (primary) hypertension: Secondary | ICD-10-CM

## 2015-04-06 DIAGNOSIS — R609 Edema, unspecified: Secondary | ICD-10-CM | POA: Diagnosis not present

## 2015-04-06 LAB — BASIC METABOLIC PANEL
BUN: 16 mg/dL (ref 6–23)
CALCIUM: 10.4 mg/dL (ref 8.4–10.5)
CO2: 31 mEq/L (ref 19–32)
CREATININE: 0.71 mg/dL (ref 0.40–1.20)
Chloride: 104 mEq/L (ref 96–112)
GFR: 85.21 mL/min (ref >60.00)
GLUCOSE: 96 mg/dL (ref 70–99)
Potassium: 3.9 mEq/L (ref 3.5–5.1)
SODIUM: 139 meq/L (ref 135–145)

## 2015-04-06 LAB — TSH: TSH: 1.4 u[IU]/mL (ref 0.35–4.50)

## 2015-04-06 LAB — BRAIN NATRIURETIC PEPTIDE: Pro B Natriuretic peptide (BNP): 53 pg/mL (ref 0.0–100.0)

## 2015-04-06 NOTE — Assessment & Plan Note (Signed)
Question related to venous insufficiency. She is not volume overloaded on examination with no edema. Schedule check TSH, BNP, potassium and renal function.

## 2015-04-06 NOTE — Assessment & Plan Note (Signed)
Blood pressure control. Occasional dizziness with standing. If the symptoms persist would consider reducing lisinopril dose.

## 2015-04-06 NOTE — Progress Notes (Signed)
HPI: 74 year old female for evaluation of edema. Patient states she was noted to have left ventricular hypertrophy on echocardiogram previously. Otherwise no prior cardiac history. She notes recent increased dyspnea on exertion. No orthopnea or PND. She describes pedal edema that worsens during the day and improves overnight. She denies exertional chest pain. Occasional skip but no sustained palpitations. Some dizziness with standing at times but no syncope.  Current Outpatient Prescriptions  Medication Sig Dispense Refill  . acetaminophen (TYLENOL) 500 MG tablet Take 500 mg by mouth as needed.      Marland Kitchen BIOTIN PO Take 3,000 mcg by mouth daily.    Marland Kitchen CALCIUM PO Take by mouth as directed.    . Coenzyme Q10 (CO Q 10 PO) Take 200 mg by mouth daily.      . Fluticasone-Salmeterol (ADVAIR DISKUS IN) Inhale 1 Inhaler into the lungs as needed. 100/50    . gabapentin (NEURONTIN) 300 MG capsule 1 tab by mouth in the AM, and 2 in the PM 90 capsule 5  . guaiFENesin (MUCINEX) 600 MG 12 hr tablet Take 600 mg by mouth as directed.    . hydrochlorothiazide (HYDRODIURIL) 12.5 MG tablet TAKE 1 TABLET BY MOUTH DAILY 90 tablet 1  . lansoprazole (PREVACID) 30 MG capsule Take 1 capsule (30 mg total) by mouth daily. 90 capsule 3  . lisinopril (PRINIVIL,ZESTRIL) 10 MG tablet 1/2 tab by mouth twice per day 90 tablet 3  . nitrofurantoin (MACRODANTIN) 50 MG capsule TAKE ONE CAPSULE BY MOUTH DAILY 90 capsule 1  . PROAIR HFA 108 (90 BASE) MCG/ACT inhaler INHALE 2 PUFFS INTO THE LUNGS EVERY 6 HOURS AS NEEDED FOR WHEEZING OR SHORTNESS OF BREATH 8.5 g 1  . sulfamethoxazole-trimethoprim (BACTRIM DS,SEPTRA DS) 800-160 MG per tablet Take 1 tablet by mouth 2 (two) times daily.     No current facility-administered medications for this visit.    Allergies  Allergen Reactions  . Aspirin Anaphylaxis  . Nsaids Anaphylaxis    BECAUSE OF REACTION TO VIOXX BECAUSE OF REACTION TO VIOXX  . Vioxx [Rofecoxib] Anaphylaxis  .  Ciprofloxacin Other (See Comments)    HEADACHE HEADACHE     Past Medical History  Diagnosis Date  . Asthma 04/27/2011  . Degenerative joint disease 04/27/2011  . Bipolar affective disorder 04/27/2011  . Chronic bronchitis 04/27/2011  . GERD (gastroesophageal reflux disease) 04/27/2011  . Allergic rhinitis, cause unspecified 04/27/2011  . HTN (hypertension) 04/27/2011  . Chronic cystitis 04/27/2011  . Colon polyps 04/27/2011  . Anemia 04/27/2011  . TIA (transient ischemic attack) 04/27/2011  . Urinary incontinence 04/27/2011  . 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 Hyperlipidemia 04/27/2011  . Osteopenia 06/01/2012  . Cervical spondylosis 06/01/2012  . Right carpal tunnel syndrome 07/22/2012  . Mild cognitive impairment 04/12/2013    Past Surgical History  Procedure Laterality Date  . Colonoscopy  34YRS AGO    IN [place],PT DOES NOT REMEMBER DOCTOR  . Orthoscopic carpetomy      BOTH THUMBS  . Trigger finger release      LEFT RING FINGER  . Bunionectomy      LEFT FOOT  . Total abdominal hysterectomy    . Tonsillectomy    . Bladder suspension      A&P REPAIR WITH MESH  . Steriotactic breast biopsy      RIGHT  . Breast lumpectomy      SEVERAL TIMES  . Polypectomy  10 YRS AGO    BENIGN POLYPS X2  . Tendon transfer  12/09/2012  Procedure: TENDON TRANSFER;  Surgeon: Wynonia Sours, MD;  Location: [place];  Service: Orthopedics;  Laterality: Left;  Tenodesis PIP with FDS Slip Left ring finger, [place]ug[place]not  . Cholecystectomy    . Mcbride bunionectomy Left 09/09/2014    @ [facility]  . Neurectomy foot Left 09/09/2014    @ [place]    History   Social History  . Marital Status: Married    Spouse Name: N/A  . Number of Children: 2  . Years of Education: N/A   Occupational History  . Not on file.   Social History Main Topics  . Smoking status: Never Smoker   . Smokeless tobacco: Never Used  . Alcohol Use: No  . Drug Use: No  . Sexual Activity: Not Currently    Other Topics Concern  . Not on file   Social History Narrative    Family History  Problem Relation Age of Onset  . Cancer Mother 5    RENAL CELL CARCINOMA  . Colon cancer Neg Hx     ROS: Back pain but no fevers or chills, productive cough, hemoptysis, dysphasia, odynophagia, melena, hematochezia, dysuria, hematuria, rash, seizure activity, orthopnea, PND, pedal edema, claudication. Remaining systems are negative.  Physical Exam:   Blood pressure 134/82, pulse 69, height 4\' 10"  (1.473 m), weight 118 lb (53.524 kg).  General:  Well developed/well nourished in NAD Skin warm/dry Patient not depressed No peripheral clubbing Back-normal HEENT-normal/normal eyelids Neck supple/normal carotid upstroke bilaterally; no bruits; no JVD; no thyromegaly chest - CTA/ normal expansion CV - RRR/normal S1 and S2; no murmurs, rubs or gallops;  PMI nondisplaced Abdomen -NT/ND, no HSM, no mass, + bowel sounds, no bruit 2+ femoral pulses, no bruits Ext-no edema, chords, 2+ DP Neuro-grossly nonfocal  ECG sinus rhythm with PACs. RV conduction delay. Left axis deviation.

## 2015-04-06 NOTE — Assessment & Plan Note (Signed)
Etiology unclear. Schedule echocardiogram to assess LV function and exercise treadmill to screen for coronary disease.

## 2015-04-06 NOTE — Patient Instructions (Signed)
Your physician recommends that you schedule a follow-up appointment in: AS NEEDED PENDING TEST RESULTS  Your physician recommends that you HAVE LAB [place]  Your physician has requested that you have an echocardiogram. Echocardiography is a painless test that uses sound waves to create images of your heart. It provides your doctor with information about the size and shape of your heart and how well your heart's chambers and valves are working. This procedure takes approximately one hour. There are no restrictions for this procedure.   Your physician has requested that you have an exercise tolerance test. For further information please visit [email or web address]. Please also follow instruction sheet, as given.     Exercise Stress Electrocardiogram An exercise stress electrocardiogram is a test to check how blood flows to your heart. It is done to find areas of poor blood flow. You will need to walk on a treadmill for this test. The electrocardiogram will record your heartbeat when you are at rest and when you are exercising. BEFORE THE PROCEDURE  Do not have drinks with caffeine or foods with caffeine for 24 hours before the test, or as told by your doctor. This includes coffee, tea (even decaf tea), sodas, chocolate, and cocoa.  Follow your doctor's instructions about eating and drinking before the test.  Ask your doctor what medicines you should or should not take before the test. Take your medicines with water unless told by your doctor not to.  If you use an inhaler, bring it with you to the test.  Bring a snack to eat after the test.  Do not  smoke for 4 hours before the test.  Do not put lotions, powders, creams, or oils on your chest before the test.  Wear comfortable shoes and clothing. PROCEDURE  You will have patches put on your chest. Small areas of your chest may need to be shaved. Wires will be connected to the patches.  Your heart rate will be watched while you are  resting and while you are exercising.  You will walk on the treadmill. The treadmill will slowly get faster to raise your heart rate.  The test will take about 1-2 hours. AFTER THE PROCEDURE  Your heart rate and blood pressure will be watched after the test.  You may return to your normal diet, activities, and medicines or as told by your doctor. Document Released: 05/07/2008 Document Revised: 04/05/2014 Document Reviewed: 07/27/2013 [facility] Patient Information 2015 [place], [place]. This information is not intended to replace advice given to you by your health care provider. Make sure you discuss any questions you have with your health care provider.

## 2015-04-19 ENCOUNTER — Ambulatory Visit (INDEPENDENT_AMBULATORY_CARE_PROVIDER_SITE_OTHER): Payer: Medicare Other | Admitting: Internal Medicine

## 2015-04-19 ENCOUNTER — Encounter: Payer: Self-pay | Admitting: Internal Medicine

## 2015-04-19 ENCOUNTER — Other Ambulatory Visit (INDEPENDENT_AMBULATORY_CARE_PROVIDER_SITE_OTHER): Payer: Medicare Other

## 2015-04-19 ENCOUNTER — Ambulatory Visit (INDEPENDENT_AMBULATORY_CARE_PROVIDER_SITE_OTHER)
Admission: RE | Admit: 2015-04-19 | Discharge: 2015-04-19 | Disposition: A | Discharge status: Home / Self Care | Payer: Medicare Other | Source: Ambulatory Visit | Attending: Internal Medicine | Admitting: Internal Medicine

## 2015-04-19 VITALS — BP 108/60 | HR 66 | Temp 98.6°F | Wt 117.1 lb

## 2015-04-19 DIAGNOSIS — J45909 Unspecified asthma, uncomplicated: Secondary | ICD-10-CM | POA: Diagnosis not present

## 2015-04-19 DIAGNOSIS — I10 Essential (primary) hypertension: Secondary | ICD-10-CM

## 2015-04-19 DIAGNOSIS — R0602 Shortness of breath: Secondary | ICD-10-CM | POA: Diagnosis not present

## 2015-04-19 DIAGNOSIS — R634 Abnormal weight loss: Secondary | ICD-10-CM | POA: Diagnosis not present

## 2015-04-19 DIAGNOSIS — R0600 Dyspnea, unspecified: Secondary | ICD-10-CM | POA: Diagnosis not present

## 2015-04-19 LAB — CBC WITH DIFFERENTIAL/PLATELET
BASOS ABS: 0 10*3/uL (ref 0.0–0.1)
Basophils Relative: 0.3 % (ref 0.0–3.0)
Eosinophils Absolute: 0.1 10*3/uL (ref 0.0–0.7)
Eosinophils Relative: 1.1 % (ref 0.0–5.0)
HEMATOCRIT: 40.3 % (ref 36.0–46.0)
Hemoglobin: 13.4 g/dL (ref 12.0–15.0)
LYMPHS ABS: 2.7 10*3/uL (ref 0.7–4.0)
Lymphocytes Relative: 30.1 % (ref 12.0–46.0)
MCHC: 33.4 g/dL (ref 30.0–36.0)
MCV: 85.3 fl (ref 78.0–100.0)
MONO ABS: 0.6 10*3/uL (ref 0.1–1.0)
MONOS PCT: 6.3 % (ref 3.0–12.0)
Neutro Abs: 5.6 10*3/uL (ref 1.4–7.7)
Neutrophils Relative %: 62.2 % (ref 43.0–77.0)
Platelets: 189 10*3/uL (ref 150.0–400.0)
RBC: 4.72 Mil/uL (ref 3.87–5.11)
RDW: 13.5 % (ref 11.5–15.5)
WBC: 9 10*3/uL (ref 4.0–10.5)

## 2015-04-19 LAB — HEPATIC FUNCTION PANEL
ALBUMIN: 4.4 g/dL (ref 3.5–5.2)
ALK PHOS: 63 U/L (ref 39–117)
ALT: 17 U/L (ref 0–35)
AST: 16 U/L (ref 0–37)
Bilirubin, Direct: 0.2 mg/dL (ref 0.0–0.3)
TOTAL PROTEIN: 7 g/dL (ref 6.0–8.3)
Total Bilirubin: 0.9 mg/dL (ref 0.2–1.2)

## 2015-04-19 LAB — SEDIMENTATION RATE: SED RATE: 13 mm/h (ref 0–22)

## 2015-04-19 NOTE — Patient Instructions (Addendum)
Please continue all other medications as before, and refills have been done if requested.  Please have the pharmacy call with any other refills you may need.  Please continue your efforts at being more active, low cholesterol diet, and weight control.  You are otherwise up to date with prevention measures today.  Please keep your appointments with your specialists as you may have planned  Please go to the XRAY Department in the Basement (go straight as you get off the elevator) for the x-ray testing  Please go to the LAB in the Basement (turn left off the elevator) for the tests to be done today  You will be contacted by phone if any changes need to be made immediately.  Otherwise, you will receive a letter about your results with an explanation, but please check with MyChart first.  Please remember to sign up for MyChart if you have not done so, as this will be important to you in the future with finding out test results, communicating by private email, and scheduling acute appointments online when needed.  Please return in 6 months, or sooner if needed 

## 2015-04-19 NOTE — Assessment & Plan Note (Signed)
Etiology unclear, Recent tsh normal, BMP ok, for lft's, spep, esr, cbc, fu cxr as well

## 2015-04-19 NOTE — Assessment & Plan Note (Signed)
A bit on the low side, o/w stable overall by history and exam, recent data reviewed with pt, and pt to continue medical treatment as before,  to f/u any worsening symptoms or concerns BP Readings from Last 3 Encounters:  04/19/15 108/60  04/06/15 134/82  12/28/14 120/80

## 2015-04-19 NOTE — Progress Notes (Signed)
Subjective:    Patient ID: Madeline Mcdaniel, female    DOB: 11-Dec-1940, 74 y.o.   MRN: [id number]  HPI   Here to f/u; overall doing ok,  Pt denies chest pain, increasing sob or doe, wheezing, orthopnea, PND, increased LE swelling, palpitations, dizziness or syncope.  Pt denies new neurological symptoms such as new headache, or facial or extremity weakness or numbness.  Pt denies polydipsia, polyuria, or low sugar episode.   Pt denies new neurological symptoms such as new headache, or facial or extremity weakness or numbness.   Pt states overall good compliance with meds, mostly trying to follow appropriate diet, with wt overall stable,  but little exercise however. For echo and stress testing soon for dyspnea per cardiology.  Does have occas stools, and every so often once per month with a relativley large watery stool  Has lost some wt, hjas lower appetite lately.  Denies worsening reflux, abd pain, dysphagia, n/v, bowel change or blood, except for some undigested food it seems in BM recently.  No hx of pancreatic insuffic. Wt Readings from Last 3 Encounters:  04/19/15 117 lb 1.3 oz (53.107 kg)  04/06/15 118 lb (53.524 kg)  12/28/14 123 lb 4 oz (55.906 kg)   Past Medical History  Diagnosis Date  . Asthma 04/27/2011  . Degenerative joint disease 04/27/2011  . Bipolar affective disorder 04/27/2011  . Chronic bronchitis 04/27/2011  . GERD (gastroesophageal reflux disease) 04/27/2011  . Allergic rhinitis, cause unspecified 04/27/2011  . HTN (hypertension) 04/27/2011  . Chronic cystitis 04/27/2011  . Colon polyps 04/27/2011  . Anemia 04/27/2011  . TIA (transient ischemic attack) 04/27/2011  . Urinary incontinence 04/27/2011  . 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 Hyperlipidemia 04/27/2011  . Osteopenia 06/01/2012  . Cervical spondylosis 06/01/2012  . Right carpal tunnel syndrome 07/22/2012  . Mild cognitive impairment 04/12/2013   Past Surgical History  Procedure Laterality Date  . Colonoscopy  50YRS AGO    IN  [place],PT DOES NOT REMEMBER DOCTOR  . Orthoscopic carpetomy      BOTH THUMBS  . Trigger finger release      LEFT RING FINGER  . Bunionectomy      LEFT FOOT  . Total abdominal hysterectomy    . Tonsillectomy    . Bladder suspension      A&P REPAIR WITH MESH  . Steriotactic breast biopsy      RIGHT  . Breast lumpectomy      SEVERAL TIMES  . Polypectomy  10 YRS AGO    BENIGN POLYPS X2  . Tendon transfer  12/09/2012    Procedure: TENDON TRANSFER;  Surgeon: Wynonia Sours, MD;  Location: [place];  Service: Orthopedics;  Laterality: Left;  Tenodesis PIP with FDS Slip Left ring finger, [place]ug[place]knot  . Cholecystectomy    . Mcbride bunionectomy Left 09/09/2014    @ [facility]  . Neurectomy foot Left 09/09/2014    @ [place]    reports that she has never smoked. She has never used smokeless tobacco. She reports that she does not drink alcohol or use illicit drugs. family history includes Cancer (age of onset: 45) in her mother. There is no history of Colon cancer. Allergies  Allergen Reactions  . Aspirin Anaphylaxis  . Nsaids Anaphylaxis    BECAUSE OF REACTION TO VIOXX BECAUSE OF REACTION TO VIOXX  . Vioxx [Rofecoxib] Anaphylaxis  . Ciprofloxacin Other (See Comments)    HEADACHE HEADACHE   Current Outpatient Prescriptions on File Prior to  Visit  Medication Sig Dispense Refill  . acetaminophen (TYLENOL) 500 MG tablet Take 500 mg by mouth as needed.      Marland Kitchen BIOTIN PO Take 3,000 mcg by mouth daily.    Marland Kitchen CALCIUM PO Take by mouth as directed.    . Coenzyme Q10 (CO Q 10 PO) Take 200 mg by mouth daily.      . Fluticasone-Salmeterol (ADVAIR DISKUS IN) Inhale 1 Inhaler into the lungs as needed. 100/50    . gabapentin (NEURONTIN) 300 MG capsule 1 tab by mouth in the AM, and 2 in the PM 90 capsule 5  . guaiFENesin (MUCINEX) 600 MG 12 hr tablet Take 600 mg by mouth as directed.    . hydrochlorothiazide (HYDRODIURIL) 12.5 MG tablet TAKE 1 TABLET BY MOUTH DAILY 90 tablet 1    . lansoprazole (PREVACID) 30 MG capsule Take 1 capsule (30 mg total) by mouth daily. 90 capsule 3  . lisinopril (PRINIVIL,ZESTRIL) 10 MG tablet 1/2 tab by mouth twice per day 90 tablet 3  . PROAIR HFA 108 (90 BASE) MCG/ACT inhaler INHALE 2 PUFFS INTO THE LUNGS EVERY 6 HOURS AS NEEDED FOR WHEEZING OR SHORTNESS OF BREATH 8.5 g 1  . nitrofurantoin (MACRODANTIN) 50 MG capsule TAKE ONE CAPSULE BY MOUTH DAILY (Patient not taking: Reported on 04/19/2015) 90 capsule 1  . sulfamethoxazole-trimethoprim (BACTRIM DS,SEPTRA DS) 800-160 MG per tablet Take 1 tablet by mouth 2 (two) times daily.     No current facility-administered medications on file prior to visit.    Review of Systems  Constitutional: Negative for unusual diaphoresis or night sweats HENT: Negative for ringing in ear or discharge Eyes: Negative for double vision or worsening visual disturbance.  Respiratory: Negative for choking and stridor.   Gastrointestinal: Negative for vomiting or other signifcant bowel change Genitourinary: Negative for hematuria or change in urine volume.  Musculoskeletal: Negative for other MSK pain or swelling Skin: Negative for color change and worsening wound.  Neurological: Negative for tremors and numbness other than noted  Psychiatric/Behavioral: Negative for decreased concentration or agitation other than above       Objective:   Physical Exam BP 108/60 mmHg  Pulse 66  Temp(Src) 98.6 F (37 C) (Oral)  Wt 117 lb 1.3 oz (53.107 kg)  SpO2 95% VS noted,  Constitutional: Pt appears in no significant distress HENT: Head: NCAT.  Right Ear: External ear normal.  Left Ear: External ear normal.  Eyes: . Pupils are equal, round, and reactive to light. Conjunctivae and EOM are normal Neck: Normal range of motion. Neck supple.  Cardiovascular: Normal rate and regular rhythm.   Pulmonary/Chest: Effort normal and breath sounds without rales or wheezing.  Abd:  Soft, NT, ND, + BS Neurological: Pt is alert.  Not confused , motor grossly intact Skin: Skin is warm. No rash, no LE edema Psychiatric: Pt behavior is normal. No agitation. some increased energy today    Assessment & Plan:

## 2015-04-19 NOTE — Assessment & Plan Note (Signed)
Exam benign, ok for cxr, also fu card recommendations as well

## 2015-04-20 ENCOUNTER — Other Ambulatory Visit: Payer: Self-pay | Admitting: Internal Medicine

## 2015-04-20 DIAGNOSIS — N3941 Urge incontinence: Secondary | ICD-10-CM | POA: Diagnosis not present

## 2015-04-20 DIAGNOSIS — Q644 Malformation of urachus: Secondary | ICD-10-CM | POA: Diagnosis not present

## 2015-04-20 DIAGNOSIS — N3090 Cystitis, unspecified without hematuria: Secondary | ICD-10-CM | POA: Diagnosis not present

## 2015-04-21 LAB — PROTEIN ELECTROPHORESIS, SERUM
Albumin ELP: 4.4 g/dL (ref 3.8–4.8)
Alpha-1-Globulin: 0.3 g/dL (ref 0.2–0.3)
Alpha-2-Globulin: 0.7 g/dL (ref 0.5–0.9)
Beta 2: 0.3 g/dL (ref 0.2–0.5)
Beta Globulin: 0.4 g/dL (ref 0.4–0.6)
GAMMA GLOBULIN: 0.8 g/dL (ref 0.8–1.7)
TOTAL PROTEIN, SERUM ELECTROPHOR: 6.8 g/dL (ref 6.1–8.1)

## 2015-04-22 ENCOUNTER — Other Ambulatory Visit: Payer: Self-pay | Admitting: Internal Medicine

## 2015-05-04 ENCOUNTER — Telehealth (HOSPITAL_COMMUNITY): Payer: Self-pay

## 2015-05-04 NOTE — Telephone Encounter (Signed)
Encounter complete. 

## 2015-05-06 ENCOUNTER — Ambulatory Visit (HOSPITAL_BASED_OUTPATIENT_CLINIC_OR_DEPARTMENT_OTHER)
Admission: RE | Admit: 2015-05-06 | Discharge: 2015-05-06 | Disposition: A | Discharge status: Home / Self Care | Payer: Medicare Other | Source: Ambulatory Visit | Attending: Cardiology | Admitting: Cardiology

## 2015-05-06 ENCOUNTER — Ambulatory Visit (HOSPITAL_COMMUNITY)
Admission: RE | Admit: 2015-05-06 | Discharge: 2015-05-06 | Disposition: A | Discharge status: Home / Self Care | Payer: Medicare Other | Source: Ambulatory Visit | Attending: Cardiovascular Disease | Admitting: Cardiovascular Disease

## 2015-05-06 DIAGNOSIS — R0600 Dyspnea, unspecified: Secondary | ICD-10-CM

## 2015-05-06 DIAGNOSIS — I10 Essential (primary) hypertension: Secondary | ICD-10-CM | POA: Diagnosis not present

## 2015-05-06 LAB — EXERCISE TOLERANCE TEST
CHL CUP MPHR: 145 {beats}/min
CHL CUP STRESS STAGE 1 SPEED: 0 mph
CHL CUP STRESS STAGE 2 HR: 86 {beats}/min
CHL CUP STRESS STAGE 2 SPEED: 1 mph
CHL CUP STRESS STAGE 3 SPEED: 1 mph
CHL CUP STRESS STAGE 4 GRADE: 10 %
CHL CUP STRESS STAGE 4 SPEED: 1.7 mph
CHL CUP STRESS STAGE 5 DBP: 117 mmHg
CHL CUP STRESS STAGE 5 HR: 139 {beats}/min
CHL CUP STRESS STAGE 5 SBP: 184 mmHg
CHL CUP STRESS STAGE 6 SPEED: 2.5 mph
CHL CUP STRESS STAGE 8 GRADE: 0 %
CSEPED: 6 min
Estimated workload: 7 METS
Peak HR: 139 {beats}/min
Percent HR: 102 %
Percent of predicted max HR: 95 %
RPE: 25576
Rest HR: 66 {beats}/min
Stage 1 DBP: 90 mmHg
Stage 1 Grade: 0 %
Stage 1 HR: 65 {beats}/min
Stage 1 SBP: 152 mmHg
Stage 2 Grade: 0 %
Stage 3 Grade: 0 %
Stage 3 HR: 86 {beats}/min
Stage 4 DBP: 97 mmHg
Stage 4 HR: 113 {beats}/min
Stage 4 SBP: 177 mmHg
Stage 5 Grade: 12 %
Stage 5 Speed: 2.5 mph
Stage 6 Grade: 12.1 %
Stage 6 HR: 139 {beats}/min
Stage 7 DBP: 104 mmHg
Stage 7 Grade: 0 %
Stage 7 HR: 115 {beats}/min
Stage 7 SBP: 178 mmHg
Stage 7 Speed: 0 mph
Stage 8 DBP: 85 mmHg
Stage 8 HR: 81 {beats}/min
Stage 8 SBP: 144 mmHg
Stage 8 Speed: 0 mph

## 2015-05-30 ENCOUNTER — Other Ambulatory Visit: Payer: Self-pay | Admitting: Internal Medicine

## 2015-06-14 ENCOUNTER — Telehealth: Payer: Self-pay | Admitting: Internal Medicine

## 2015-06-14 ENCOUNTER — Encounter: Payer: Self-pay | Admitting: Internal Medicine

## 2015-06-14 NOTE — Telephone Encounter (Signed)
Pt would like to switch to Dr. Hilarie Fredrickson, states her husband sees Dr. Hilarie Fredrickson. Dr. Henrene Pastor will you approve the switch?

## 2015-06-14 NOTE — Telephone Encounter (Signed)
Sure

## 2015-06-14 NOTE — Telephone Encounter (Signed)
ok 

## 2015-06-14 NOTE — Telephone Encounter (Signed)
Provider change from Dr. Henrene Pastor to Dr. Hilarie Fredrickson approved if she needs to schedule an appt.

## 2015-06-14 NOTE — Telephone Encounter (Signed)
Dr. Pyrtle will you accept this pt? 

## 2015-06-22 ENCOUNTER — Other Ambulatory Visit: Payer: Self-pay | Admitting: Internal Medicine

## 2015-06-22 ENCOUNTER — Other Ambulatory Visit: Payer: Self-pay

## 2015-06-22 DIAGNOSIS — Z1231 Encounter for screening mammogram for malignant neoplasm of breast: Secondary | ICD-10-CM

## 2015-08-01 ENCOUNTER — Ambulatory Visit
Admission: RE | Admit: 2015-08-01 | Discharge: 2015-08-01 | Disposition: A | Discharge status: Home / Self Care | Payer: Medicare Other | Source: Ambulatory Visit

## 2015-08-01 DIAGNOSIS — Z1231 Encounter for screening mammogram for malignant neoplasm of breast: Secondary | ICD-10-CM

## 2015-08-05 ENCOUNTER — Telehealth: Payer: Self-pay

## 2015-08-05 DIAGNOSIS — N63 Unspecified lump in unspecified breast: Secondary | ICD-10-CM

## 2015-08-05 NOTE — Telephone Encounter (Signed)
Ok Thx 

## 2015-08-05 NOTE — Telephone Encounter (Signed)
Pt states that she needs an order for a Diagnostic mammogram for a lump she found in her left breast, please advise in Dr. Gwynn Burly absence. Thanks

## 2015-08-10 ENCOUNTER — Other Ambulatory Visit: Payer: Self-pay

## 2015-08-10 ENCOUNTER — Other Ambulatory Visit: Payer: Self-pay | Admitting: Internal Medicine

## 2015-08-10 DIAGNOSIS — N63 Unspecified lump in unspecified breast: Secondary | ICD-10-CM

## 2015-08-12 ENCOUNTER — Encounter: Payer: Self-pay | Admitting: Internal Medicine

## 2015-08-16 ENCOUNTER — Ambulatory Visit
Admission: RE | Admit: 2015-08-16 | Discharge: 2015-08-16 | Disposition: A | Discharge status: Home / Self Care | Payer: Medicare Other | Source: Ambulatory Visit | Attending: Internal Medicine | Admitting: Internal Medicine

## 2015-08-16 ENCOUNTER — Other Ambulatory Visit: Payer: Self-pay | Admitting: Internal Medicine

## 2015-08-16 ENCOUNTER — Ambulatory Visit (INDEPENDENT_AMBULATORY_CARE_PROVIDER_SITE_OTHER): Payer: Medicare Other

## 2015-08-16 DIAGNOSIS — R928 Other abnormal and inconclusive findings on diagnostic imaging of breast: Secondary | ICD-10-CM

## 2015-08-16 DIAGNOSIS — Z23 Encounter for immunization: Secondary | ICD-10-CM

## 2015-08-16 DIAGNOSIS — N63 Unspecified lump in unspecified breast: Secondary | ICD-10-CM

## 2015-08-16 DIAGNOSIS — N6489 Other specified disorders of breast: Secondary | ICD-10-CM | POA: Diagnosis not present

## 2015-08-17 ENCOUNTER — Ambulatory Visit (INDEPENDENT_AMBULATORY_CARE_PROVIDER_SITE_OTHER): Payer: Medicare Other | Admitting: Internal Medicine

## 2015-08-17 ENCOUNTER — Encounter: Payer: Self-pay | Admitting: Internal Medicine

## 2015-08-17 VITALS — BP 136/72 | HR 60 | Ht <= 58 in | Wt 113.8 lb

## 2015-08-17 DIAGNOSIS — R6881 Early satiety: Secondary | ICD-10-CM

## 2015-08-17 DIAGNOSIS — R630 Anorexia: Secondary | ICD-10-CM

## 2015-08-17 DIAGNOSIS — R110 Nausea: Secondary | ICD-10-CM

## 2015-08-17 DIAGNOSIS — K9089 Other intestinal malabsorption: Secondary | ICD-10-CM

## 2015-08-17 DIAGNOSIS — K219 Gastro-esophageal reflux disease without esophagitis: Secondary | ICD-10-CM | POA: Diagnosis not present

## 2015-08-17 DIAGNOSIS — R634 Abnormal weight loss: Secondary | ICD-10-CM | POA: Diagnosis not present

## 2015-08-17 DIAGNOSIS — K909 Intestinal malabsorption, unspecified: Secondary | ICD-10-CM

## 2015-08-17 MED ORDER — CHOLESTYRAMINE 4 G PO PACK: PACK | ORAL | Status: DC

## 2015-08-17 MED ORDER — RANITIDINE HCL 75 MG PO TABS: 75.0000 mg | ORAL_TABLET | Freq: Two times a day (BID) | ORAL | Status: DC

## 2015-08-17 NOTE — Patient Instructions (Signed)
You have been scheduled for an endoscopy. Please follow written instructions given to you at your visit today. If you use inhalers (even only as needed), please bring them with you on the day of your procedure. Your physician has requested that you go to www.startemmi.com and enter the access code given to you at your visit today. This web site gives a general overview about your procedure. However, you should still follow specific instructions given to you by our office regarding your preparation for the procedure.  We have sent the following medications to your pharmacy for you to pick up at your convenience: Zantac 75 mg twice daily Cholestyramine 4 gram pack-1 every morning (at least 2 hours apart from all other medications)

## 2015-08-17 NOTE — Progress Notes (Signed)
Patient ID: Madeline Mcdaniel, female   DOB: 11/17/1941, 74 y.o.   MRN: [id number] HPI: Madeline Mcdaniel is a 74 yo female with PMH of GERD, colonic diverticulosis, cholecystectomy, chronic cystitis, hypertension, bipolar disorder, mild cognitive impairment who is seen in consultation at the request of Dr. Jenny Reichmann to evaluate weight loss, nausea, and frequent loose stools. She is here alone today. She reports that over the last several months she has had slight nausea without vomiting. She has lost weight and reports this is unintentional. She has lost 15 pounds in the past year, greater than 20 pounds in the last 2 years. She reports her appetite is dramatically decreased and she forces herself to eat. She reports early fullness. She has reflux with burning chest pain after eating. This is also led to a dry mouth. She was taking Prevacid for some time but this was unhelpful and lead other symptoms. She has a hard time remembering which symptoms. She's changed her diet to avoid spicy foods and also is avoiding eating after 6 PM. She denies dysphagia or odynophagia. She does endorse gas borborygmi and loose stools. She has loose stools postprandially and 1 or 2 days per week has large volume watery diarrhea. This is all in the past year since cholecystectomy. She denies blood in her stool or melena. She was previously on lithium but this was stopped and gabapentin was increased within the last few months.  He has had prior colonoscopy the last with Dr. Henrene Pastor on 06/01/2011. This revealed a pedunculated rectal polyp which was removed. This was found to be hyperplastic. 10 year interval was recommended for next surveillance colonoscopy. She had moderate diverticulosis in the sigmoid and ascending colon.  She does have issues with urinary leaking, frequency and urgency. She is followed by urology.  She has an upcoming trip to [place] and she hopes that she will not have diarrhea all traveling  Past Medical History   Diagnosis Date  . Asthma 04/27/2011  . Degenerative joint disease 04/27/2011  . Bipolar affective disorder 04/27/2011  . Chronic bronchitis 04/27/2011  . GERD (gastroesophageal reflux disease) 04/27/2011  . Allergic rhinitis, cause unspecified 04/27/2011  . HTN (hypertension) 04/27/2011  . Chronic cystitis 04/27/2011  . Colon polyps 04/27/2011  . Anemia 04/27/2011  . TIA (transient ischemic attack) 04/27/2011  . Urinary incontinence 04/27/2011  . 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 Hyperlipidemia 04/27/2011  . Osteopenia 06/01/2012  . Cervical spondylosis 06/01/2012  . Right carpal tunnel syndrome 07/22/2012  . Mild cognitive impairment 04/12/2013    Past Surgical History  Procedure Laterality Date  . Colonoscopy  88YRS AGO    IN [place],PT DOES NOT REMEMBER DOCTOR  . Orthoscopic carpetomy      BOTH THUMBS  . Trigger finger release      LEFT RING FINGER  . Bunionectomy      LEFT FOOT  . Total abdominal hysterectomy    . Tonsillectomy    . Bladder suspension      A&P REPAIR WITH MESH  . Steriotactic breast biopsy      RIGHT  . Breast lumpectomy      SEVERAL TIMES  . Polypectomy  10 YRS AGO    BENIGN POLYPS X2  . Tendon transfer  12/09/2012    Procedure: TENDON TRANSFER;  Surgeon: Wynonia Sours, MD;  Location: [facility];  Service: Orthopedics;  Laterality: Left;  Tenodesis PIP with FDS Slip Left ring finger, Juggerknot  . Cholecystectomy    . Mcbride bunionectomy  Left 09/09/2014    @ [facility]  . Neurectomy foot Left 09/09/2014    @ [facility]    Outpatient Prescriptions Prior to Visit  Medication Sig Dispense Refill  . acetaminophen (TYLENOL) 500 MG tablet Take 500 mg by mouth as needed.      Marland Kitchen BIOTIN PO Take 3,000 mcg by mouth daily.    Marland Kitchen CALCIUM PO Take by mouth as directed.    . Coenzyme Q10 (CO Q 10 PO) Take 200 mg by mouth daily.      . Fluticasone-Salmeterol (ADVAIR DISKUS IN) Inhale 1 Inhaler into the lungs as needed. 100/50    . gabapentin (NEURONTIN) 300 MG  capsule 1 tab by mouth in the AM, and 2 in the PM 90 capsule 5  . guaiFENesin (MUCINEX) 600 MG 12 hr tablet Take 600 mg by mouth as directed.    . hydrochlorothiazide (HYDRODIURIL) 12.5 MG tablet TAKE 1 TABLET BY MOUTH DAILY 90 tablet 3  . lisinopril (PRINIVIL,ZESTRIL) 10 MG tablet 1/2 tab by mouth twice per day 90 tablet 3  . lisinopril (PRINIVIL,ZESTRIL) 10 MG tablet TAKE 1 TABLET BY MOUTH DAILY. 90 tablet 3  . PROAIR HFA 108 (90 BASE) MCG/ACT inhaler INHALE 2 PUFFS INTO THE LUNGS EVERY 6 HOURS AS NEEDED FOR WHEEZING OR SHORTNESS OF BREATH 8.5 g 1  . lansoprazole (PREVACID) 30 MG capsule Take 1 capsule (30 mg total) by mouth daily. 90 capsule 3  . nitrofurantoin (MACRODANTIN) 50 MG capsule Take 1 capsule (50 mg total) by mouth daily. 90 capsule 3  . sulfamethoxazole-trimethoprim (BACTRIM DS,SEPTRA DS) 800-160 MG per tablet Take 1 tablet by mouth 2 (two) times daily.     No facility-administered medications prior to visit.    Allergies  Allergen Reactions  . Aspirin Anaphylaxis  . Nsaids Anaphylaxis    BECAUSE OF REACTION TO VIOXX BECAUSE OF REACTION TO VIOXX  . Vioxx [Rofecoxib] Anaphylaxis  . Ciprofloxacin Other (See Comments)    HEADACHE HEADACHE    Family History  Problem Relation Age of Onset  . Cancer Mother 78    RENAL CELL CARCINOMA  . Colon cancer Neg Hx     Social History  Substance Use Topics  . Smoking status: Never Smoker   . Smokeless tobacco: Never Used  . Alcohol Use: No    ROS: As per history of present illness, otherwise negative  BP 136/72 mmHg  Pulse 60  Ht 4' 9.75" (1.467 m)  Wt 113 lb 12.8 oz (51.619 kg)  BMI 23.99 kg/m2 Constitutional: Well-developed and well-nourished. No distress. HEENT: Normocephalic and atraumatic. Oropharynx is clear and moist. No oropharyngeal exudate. Conjunctivae are normal.  No scleral icterus. Neck: Neck supple. Trachea midline. Cardiovascular: Normal rate, regular rhythm and intact distal pulses. No  M/R/G Pulmonary/chest: Effort normal and breath sounds normal. No wheezing, rales or rhonchi. Abdominal: Soft, nontender, nondistended. Bowel sounds active throughout.  Extremities: no clubbing, cyanosis, or edema Lymphadenopathy: No cervical adenopathy noted. Neurological: Alert and oriented to person place and time. Skin: Skin is warm and dry. No rashes noted. Psychiatric: Normal mood and affect. Behavior is normal.  RELEVANT LABS AND IMAGING: CBC    Component Value Date/Time   WBC 9.0 04/19/2015 1501   RBC 4.72 04/19/2015 1501   HGB 13.4 04/19/2015 1501   HCT 40.3 04/19/2015 1501   PLT 189.0 04/19/2015 1501   MCV 85.3 04/19/2015 1501   MCHC 33.4 04/19/2015 1501   RDW 13.5 04/19/2015 1501   LYMPHSABS 2.7 04/19/2015 1501  MONOABS 0.6 04/19/2015 1501   EOSABS 0.1 04/19/2015 1501   BASOSABS 0.0 04/19/2015 1501    CMP     Component Value Date/Time   NA 139 04/06/2015 1044   K 3.9 04/06/2015 1044   CL 104 04/06/2015 1044   CO2 31 04/06/2015 1044   GLUCOSE 96 04/06/2015 1044   BUN 16 04/06/2015 1044   CREATININE 0.71 04/06/2015 1044   CALCIUM 10.4 04/06/2015 1044   PROT 7.0 04/19/2015 1501   ALBUMIN 4.4 04/19/2015 1501   AST 16 04/19/2015 1501   ALT 17 04/19/2015 1501   ALKPHOS 63 04/19/2015 1501   BILITOT 0.9 04/19/2015 1501   GFRNONAA 84* 12/05/2012 1300   GFRAA >90 12/05/2012 1300   Lab Results  Component Value Date   TSH 1.40 04/06/2015   Colonoscopy reviewed as per HPI  ASSESSMENT/PLAN: 74 yo female with PMH of GERD, colonic diverticulosis, cholecystectomy, chronic cystitis, hypertension, bipolar disorder, mild cognitive impairment who is seen in consultation at the request of Dr. Jenny Reichmann to evaluate weight loss, nausea, and frequent loose stools.  1. GERD/nausea/early satiety/weight loss -- I recommended upper endoscopy to further evaluate her weight loss but also her nausea early satiety and reflux disease. By her history it seems she was intolerant to PPI and  I will try Zantac 75 milligrams twice daily. She is up-to-date with screening colonoscopy.  2. Loose stools/intermittent diarrhea -- likely secondary to bile salt diarrhea after cholecystectomy. Trial of cholestyramine 4 g packet each day. She's instructed to separate this from any other medications. Do not take other medicines within 2 hours before after cholestyramine.  Office follow-up after upper endoscopy. Call if having issues with new medications. She voices understanding.    BP:ZWCHE W John, Md [address] [place], [place] [address]

## 2015-09-28 DIAGNOSIS — D1801 Hemangioma of skin and subcutaneous tissue: Secondary | ICD-10-CM | POA: Diagnosis not present

## 2015-09-28 DIAGNOSIS — L570 Actinic keratosis: Secondary | ICD-10-CM | POA: Diagnosis not present

## 2015-09-28 DIAGNOSIS — L578 Other skin changes due to chronic exposure to nonionizing radiation: Secondary | ICD-10-CM | POA: Diagnosis not present

## 2015-09-28 DIAGNOSIS — L821 Other seborrheic keratosis: Secondary | ICD-10-CM | POA: Diagnosis not present

## 2015-09-29 ENCOUNTER — Ambulatory Visit (AMBULATORY_SURGERY_CENTER): Payer: Medicare Other | Admitting: Internal Medicine

## 2015-09-29 ENCOUNTER — Encounter: Payer: Self-pay | Admitting: Internal Medicine

## 2015-09-29 VITALS — BP 109/58 | HR 62 | Temp 96.9°F | Resp 16 | Ht <= 58 in | Wt 113.0 lb

## 2015-09-29 DIAGNOSIS — I10 Essential (primary) hypertension: Secondary | ICD-10-CM | POA: Diagnosis not present

## 2015-09-29 DIAGNOSIS — K2950 Unspecified chronic gastritis without bleeding: Secondary | ICD-10-CM | POA: Diagnosis not present

## 2015-09-29 DIAGNOSIS — R6881 Early satiety: Secondary | ICD-10-CM

## 2015-09-29 DIAGNOSIS — R634 Abnormal weight loss: Secondary | ICD-10-CM | POA: Diagnosis not present

## 2015-09-29 DIAGNOSIS — K2990 Gastroduodenitis, unspecified, without bleeding: Secondary | ICD-10-CM | POA: Diagnosis not present

## 2015-09-29 DIAGNOSIS — K219 Gastro-esophageal reflux disease without esophagitis: Secondary | ICD-10-CM

## 2015-09-29 DIAGNOSIS — K2970 Gastritis, unspecified, without bleeding: Secondary | ICD-10-CM | POA: Diagnosis not present

## 2015-09-29 DIAGNOSIS — R110 Nausea: Secondary | ICD-10-CM

## 2015-09-29 DIAGNOSIS — J45909 Unspecified asthma, uncomplicated: Secondary | ICD-10-CM | POA: Diagnosis not present

## 2015-09-29 MED ORDER — SODIUM CHLORIDE 0.9 % IV SOLN: 500.0000 mL | INTRAVENOUS | Status: DC

## 2015-09-29 NOTE — Progress Notes (Signed)
Transferred to recovery room. A/O x3, pleased with MAC.  VSS.  Report to Annette, RN. 

## 2015-09-29 NOTE — Patient Instructions (Signed)
YOU HAD AN ENDOSCOPIC PROCEDURE TODAY AT [facility]:   Refer to the procedure report that was given to you for any specific questions about what was found during the examination.  If the procedure report does not answer your questions, please call your gastroenterologist to clarify.  If you requested that your care partner not be given the details of your procedure findings, then the procedure report has been included in a sealed envelope for you to review at your convenience later.  YOU SHOULD EXPECT: Some feelings of bloating in the abdomen. Passage of more gas than usual.  Walking can help get rid of the air that was put into your GI tract during the procedure and reduce the bloating. If you had a lower endoscopy (such as a colonoscopy or flexible sigmoidoscopy) you may notice spotting of blood in your stool or on the toilet paper. If you underwent a bowel prep for your procedure, you may not have a normal bowel movement for a few days.  Please Note:  You might notice some irritation and congestion in your nose or some drainage.  This is from the oxygen used during your procedure.  There is no need for concern and it should clear up in a day or so.  SYMPTOMS TO REPORT IMMEDIATELY:    Following upper endoscopy (EGD)  Vomiting of blood or coffee ground material  New chest pain or pain under the shoulder blades  Painful or persistently difficult swallowing  New shortness of breath  Fever of 100F or higher  Black, tarry-looking stools  For urgent or emergent issues, a gastroenterologist can be reached at any hour by calling [phone].   DIET: Your first meal following the procedure should be a small meal and then it is ok to progress to your normal diet. Heavy or fried foods are harder to digest and may make you feel nauseous or bloated.  Likewise, meals heavy in dairy and vegetables can increase bloating.  Drink plenty of fluids but you should avoid alcoholic beverages  for 24 hours.  ACTIVITY:  You should plan to take it easy for the rest of today and you should NOT DRIVE or use heavy machinery until tomorrow (because of the sedation medicines used during the test).    FOLLOW UP: Our staff will call the number listed on your records the next business day following your procedure to check on you and address any questions or concerns that you may have regarding the information given to you following your procedure. If we do not reach you, we will leave a message.  However, if you are feeling well and you are not experiencing any problems, there is no need to return our call.  We will assume that you have returned to your regular daily activities without incident.  If any biopsies were taken you will be contacted by phone or by letter within the next 1-3 weeks.  Please call us at [phone] if you have not heard about the biopsies in 3 weeks.    SIGNATURES/CONFIDENTIALITY: You and/or your care partner have signed paperwork which will be entered into your electronic medical record.  These signatures attest to the fact that that the information above on your After Visit Summary has been reviewed and is understood.  Full responsibility of the confidentiality of this discharge information lies with you and/or your care-partner.    Handouts were given to your care partner on gastritis, GERD and hiatal hernia. Continue taking ranitidine twice  per day. Increase cholestyramine to 1 gram packet twice daily.  Please separate this from any other medications by 2 hours on either side of the dose. Office follow up if symptoms don not improve. You may resume your current medications today. Await biopsy results. Please call if any questions or concerns.

## 2015-09-29 NOTE — Op Note (Signed)
[facility]  [address]. [place], [address]   ENDOSCOPY PROCEDURE REPORT  PATIENT: Madeline, Mcdaniel  MR#: [id number] BIRTHDATE: July 07, 1941 , 50  yrs. old GENDER: female ENDOSCOPIST: Jerene Bears, MD REFERRED BY:  Cathlean Cower, M.D. PROCEDURE DATE:  09/29/2015 PROCEDURE:  EGD, diagnostic and EGD w/ biopsy ASA CLASS:     Class III INDICATIONS:  nausea, weight loss, and early satiety, GERD. MEDICATIONS: Monitored anesthesia care and Propofol 90 mg IV TOPICAL ANESTHETIC: none  DESCRIPTION OF PROCEDURE: After the risks benefits and alternatives of the procedure were thoroughly explained, informed consent was obtained.  The LB [id number] endoscope was introduced through the mouth and advanced to the second portion of the duodenum , Without limitations.  The instrument was slowly withdrawn as the mucosa was fully examined.   ESOPHAGUS: The mucosa of the esophagus appeared normal.  STOMACH: A 3 cm hiatal hernia was noted.   Moderate gastritis (inflammation) was found in the entire examined stomach.  Cold forcep biopsies were taken at the gastric body, antrum and angularis to evaluate for h.  pylori.  DUODENUM: The duodenal mucosa showed no abnormalities in the bulb and 2nd part of the duodenum.  Cold forceps biopsies were taken in the bulb and second portion.  Retroflexed views revealed a hiatal hernia.     The scope was then withdrawn from the patient and the procedure completed.  COMPLICATIONS: There were no immediate complications.  ENDOSCOPIC IMPRESSION: 1.   The mucosa of the esophagus appeared normal 2.   3 cm hiatal hernia 3.   Gastritis (inflammation) was found in the entire examined stomach; biopsies performed 4.   The duodenal mucosa showed no abnormalities in the bulb and 2nd part of the duodenum cold forceps biopsies were taken in the bulb and second portion  RECOMMENDATIONS: 1.  Await biopsy results 2.  Continue ranitidine twice daily 3.   Increase cholestyramine to 1 gram packet twice daily.  Please separate this from any other medication by 2 hours on either side of the dose 4.  Office follow-up if symptoms not improved   eSigned:  Jerene Bears, MD 09/29/2015 10:31 AM    SW:HQPRF Quin Hoop, MD and The Patient  PATIENT NAME:  Madeline, Mcdaniel MR#: [id number]

## 2015-09-29 NOTE — Progress Notes (Signed)
No problems noted in the recovery room. maw 

## 2015-09-29 NOTE — Progress Notes (Signed)
Called to room to assist during endoscopic procedure.  Patient ID and intended procedure confirmed with present staff. Received instructions for my participation in the procedure from the performing physician.  

## 2015-09-30 ENCOUNTER — Telehealth: Payer: Self-pay | Admitting: *Deleted

## 2015-09-30 NOTE — Telephone Encounter (Signed)
  Follow up Call-  Call back number 09/29/2015  Post procedure Call Back phone  # [phone]  Permission to leave phone message Yes     Patient questions:  Do you have a fever, pain , or abdominal swelling? No. Pain Score  0 *  Have you tolerated food without any problems? Yes.    Have you been able to return to your normal activities? Yes.    Do you have any questions about your discharge instructions: Diet   No. Medications  No. Follow up visit  No.  Do you have questions or concerns about your Care? No.  Actions: * If pain score is 4 or above: No action needed, pain <4.

## 2015-10-03 ENCOUNTER — Other Ambulatory Visit: Payer: Self-pay | Admitting: Internal Medicine

## 2015-10-05 ENCOUNTER — Encounter: Payer: Self-pay | Admitting: Internal Medicine

## 2015-10-06 ENCOUNTER — Other Ambulatory Visit: Payer: Self-pay | Admitting: Internal Medicine

## 2015-10-10 ENCOUNTER — Telehealth: Payer: Self-pay | Admitting: Internal Medicine

## 2015-10-10 ENCOUNTER — Other Ambulatory Visit: Payer: Self-pay | Admitting: Internal Medicine

## 2015-10-10 MED ORDER — CHOLESTYRAMINE 4 G PO PACK: PACK | ORAL | Status: DC

## 2015-10-10 NOTE — Telephone Encounter (Signed)
Patient was actually increased from 1 packet of questran daily to 1 packet 2 times daily. I will send new rx to pharmacy.

## 2015-10-26 DIAGNOSIS — Z87440 Personal history of urinary (tract) infections: Secondary | ICD-10-CM | POA: Diagnosis not present

## 2015-10-26 DIAGNOSIS — N3090 Cystitis, unspecified without hematuria: Secondary | ICD-10-CM | POA: Diagnosis not present

## 2016-01-09 ENCOUNTER — Telehealth: Payer: Self-pay

## 2016-01-09 NOTE — Telephone Encounter (Signed)
Patient called to educate on Medicare Wellness apt. LVM for the patient to call back to educate and schedule for wellness visit prior to apt on 2/16 with Dr. Jenny Reichmann at 3:30 pm

## 2016-01-10 NOTE — Telephone Encounter (Signed)
This patient called back and agreed to AWV at 2:45 prior to seeing Dr. 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

## 2016-01-12 ENCOUNTER — Other Ambulatory Visit: Payer: Self-pay | Admitting: Internal Medicine

## 2016-01-18 ENCOUNTER — Other Ambulatory Visit: Payer: Self-pay | Admitting: Internal Medicine

## 2016-01-18 DIAGNOSIS — R928 Other abnormal and inconclusive findings on diagnostic imaging of breast: Secondary | ICD-10-CM

## 2016-01-19 ENCOUNTER — Encounter: Payer: Self-pay | Admitting: Internal Medicine

## 2016-01-19 ENCOUNTER — Ambulatory Visit (INDEPENDENT_AMBULATORY_CARE_PROVIDER_SITE_OTHER): Payer: Medicare Other | Admitting: Internal Medicine

## 2016-01-19 ENCOUNTER — Other Ambulatory Visit (INDEPENDENT_AMBULATORY_CARE_PROVIDER_SITE_OTHER): Payer: Medicare Other

## 2016-01-19 VITALS — BP 112/60 | HR 66 | Temp 97.9°F | Resp 20 | Wt 112.0 lb

## 2016-01-19 DIAGNOSIS — E785 Hyperlipidemia, unspecified: Secondary | ICD-10-CM

## 2016-01-19 DIAGNOSIS — J4520 Mild intermittent asthma, uncomplicated: Secondary | ICD-10-CM | POA: Diagnosis not present

## 2016-01-19 DIAGNOSIS — I10 Essential (primary) hypertension: Secondary | ICD-10-CM

## 2016-01-19 DIAGNOSIS — R350 Frequency of micturition: Secondary | ICD-10-CM | POA: Diagnosis not present

## 2016-01-19 DIAGNOSIS — M810 Age-related osteoporosis without current pathological fracture: Secondary | ICD-10-CM

## 2016-01-19 LAB — CBC WITH DIFFERENTIAL/PLATELET
Basophils Absolute: 0 10*3/uL (ref 0.0–0.1)
Basophils Relative: 0.3 % (ref 0.0–3.0)
EOS ABS: 0.1 10*3/uL (ref 0.0–0.7)
Eosinophils Relative: 0.9 % (ref 0.0–5.0)
HCT: 40.4 % (ref 36.0–46.0)
HEMOGLOBIN: 13.5 g/dL (ref 12.0–15.0)
Lymphocytes Relative: 40.9 % (ref 12.0–46.0)
Lymphs Abs: 3.5 10*3/uL (ref 0.7–4.0)
MCHC: 33.4 g/dL (ref 30.0–36.0)
MCV: 85.4 fl (ref 78.0–100.0)
MONO ABS: 0.6 10*3/uL (ref 0.1–1.0)
Monocytes Relative: 7.6 % (ref 3.0–12.0)
NEUTROS PCT: 50.3 % (ref 43.0–77.0)
Neutro Abs: 4.3 10*3/uL (ref 1.4–7.7)
Platelets: 140 10*3/uL — ABNORMAL LOW (ref 150.0–400.0)
RBC: 4.73 Mil/uL (ref 3.87–5.11)
RDW: 12.9 % (ref 11.5–15.5)
WBC: 8.5 10*3/uL (ref 4.0–10.5)

## 2016-01-19 MED ORDER — SOLIFENACIN SUCCINATE 5 MG PO TABS: 5.0000 mg | ORAL_TABLET | Freq: Every day | ORAL | Status: DC

## 2016-01-19 NOTE — Telephone Encounter (Signed)
Spoke to the patient and conflict for apt this afternoon. Stayed and completed partial assessment; MMSE was 6/29 but will recheck when she comes back; Was told at one time that she had stroke many years ago; concussion when young and feel off ladder;  Some spelling difficulty; orientation is good; could subtract 7 from 100 or 3 from 20 or spell world backwards; states she has issues with recall and misplaces items at times, finding them where they do not belong; spouse states she repeats herself;  Will come back to complete assessment when she has her dexa scan.  Bi-polar hx; managed at present; no depression;   Will fup to schedule dexa scan; downstairs for lab work;

## 2016-01-19 NOTE — Progress Notes (Signed)
Pre visit review using our clinic review tool, if applicable. No additional management support is needed unless otherwise documented below in the visit note. 

## 2016-01-19 NOTE — Assessment & Plan Note (Signed)
Ok for UA today, but suspect OAB, ok for vesicare trial if ok with insurance

## 2016-01-19 NOTE — Progress Notes (Signed)
Subjective:    Patient ID: Madeline Mcdaniel, female    DOB: 1941-06-06, 75 y.o.   MRN: [id number]  HPI  Here for yearly f/u;  Overall doing ok;  Pt denies Chest pain, worsening SOB, DOE, wheezing, orthopnea, PND, worsening LE edema, palpitations, dizziness or syncope.  Pt denies neurological change such as new headache, facial or extremity weakness.  Pt denies polydipsia, polyuria, or low sugar symptoms. Pt states overall good compliance with treatment and medications, good tolerability, and has been trying to follow appropriate diet.  Pt denies worsening depressive symptoms, suicidal ideation or panic. No fever, night sweats, wt loss, loss of appetite, or other constitutional symptoms.  Pt states good ability with ADL's, has low fall risk, home safety reviewed and adequate, no other significant changes in hearing or vision, and only occasionally active with exercise. Increased gabapentin seemed to help depression.  Also dpoing well on cholestyramine working well for post GB diarrhea per Dr Hilarie Fredrickson. Due for DXA.  Has f/u for right breast screeening in 31mo. Past Medical History  Diagnosis Date  . Asthma 04/27/2011  . Degenerative joint disease 04/27/2011  . Bipolar affective disorder ([place]) 04/27/2011  . Chronic bronchitis 04/27/2011  . GERD (gastroesophageal reflux disease) 04/27/2011  . Allergic rhinitis, cause unspecified 04/27/2011  . HTN (hypertension) 04/27/2011  . Chronic cystitis 04/27/2011  . Colon polyps 04/27/2011  . Anemia 04/27/2011  . TIA (transient ischemic attack) 04/27/2011  . Urinary incontinence 04/27/2011  . 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 Hyperlipidemia 04/27/2011  . Osteopenia 06/01/2012  . Cervical spondylosis 06/01/2012  . Right carpal tunnel syndrome 07/22/2012  . Mild cognitive impairment 04/12/2013   Past Surgical History  Procedure Laterality Date  . Colonoscopy  65YRS AGO    IN [place],PT DOES NOT REMEMBER DOCTOR  . Orthoscopic carpetomy      BOTH THUMBS  . Trigger finger release        LEFT RING FINGER  . Bunionectomy      LEFT FOOT  . Total abdominal hysterectomy    . Tonsillectomy    . Bladder suspension      A&P REPAIR WITH MESH  . Steriotactic breast biopsy      RIGHT  . Breast lumpectomy      SEVERAL TIMES  . Polypectomy  10 YRS AGO    BENIGN POLYPS X2  . Tendon transfer  12/09/2012    Procedure: TENDON TRANSFER;  Surgeon: Wynonia Sours, MD;  Location: [facility];  Service: Orthopedics;  Laterality: Left;  Tenodesis PIP with FDS Slip Left ring finger, [place]uggerknot  . Cholecystectomy    . Mcbride bunionectomy Left 09/09/2014    @ [facility]  . Neurectomy foot Left 09/09/2014    @ [facility]    reports that she has never smoked. She has never used smokeless tobacco. She reports that she does not drink alcohol or use illicit drugs. family history includes Cancer (age of onset: 73) in her mother; Colon cancer in her paternal grandfather. Allergies  Allergen Reactions  . Aspirin Anaphylaxis  . Nsaids Anaphylaxis    BECAUSE OF REACTION TO VIOXX BECAUSE OF REACTION TO VIOXX  . Vioxx [Rofecoxib] Anaphylaxis  . Ciprofloxacin Other (See Comments)    HEADACHE HEADACHE   Current Outpatient Prescriptions on File Prior to Visit  Medication Sig Dispense Refill  . acetaminophen (TYLENOL) 500 MG tablet Take 500 mg by mouth as needed.      Marland Kitchen BIOTIN PO Take 3,000 mcg by mouth daily.    Marland Kitchen  CALCIUM PO Take by mouth as directed.    . cholestyramine (QUESTRAN) 4 G packet TAKE 1 PACKET BY MOUTH TWICE DAILY(2 HOURS APART FROM ALL OTHER MEDS) 180 packet 0  . cholestyramine (QUESTRAN) 4 g packet TAKE 1 PACKET BY MOUTH TWICE DAILY(2 HOURS APART FROM ALL OTHER MEDS) 180 packet 0  . Coenzyme Q10 (CO Q 10 PO) Take 200 mg by mouth daily.      . Cranberry 500 MG CAPS Take by mouth 2 (two) times daily.    . Fluticasone-Salmeterol (ADVAIR DISKUS IN) Inhale 1 Inhaler into the lungs as needed. 100/50    . gabapentin (NEURONTIN) 300 MG capsule TAKE ONE CAPSULE BY MOUTH  EVERY MORNING AND TAKE 2 CAPSULES BY MOUTH EVERY EVENING 90 capsule 3  . guaiFENesin (MUCINEX) 600 MG 12 hr tablet Take 600 mg by mouth as directed.    . hydrochlorothiazide (HYDRODIURIL) 12.5 MG tablet TAKE 1 TABLET BY MOUTH DAILY 90 tablet 3  . lisinopril (PRINIVIL,ZESTRIL) 10 MG tablet 1/2 tab by mouth twice per day 90 tablet 3  . PROAIR HFA 108 (90 BASE) MCG/ACT inhaler INHALE 2 PUFFS INTO THE LUNGS EVERY 6 HOURS AS NEEDED FOR WHEEZING OR SHORTNESS OF BREATH 8.5 g 1  . ranitidine (ZANTAC 75) 75 MG tablet Take 1 tablet (75 mg total) by mouth 2 (two) times daily. 60 tablet 2   No current facility-administered medications on file prior to visit.   Review of Systems Constitutional: Negative for increased diaphoresis, other activity, appetite or siginficant weight change other than noted HENT: Negative for worsening hearing loss, ear pain, facial swelling, mouth sores and neck stiffness.   Eyes: Negative for other worsening pain, redness or visual disturbance.  Respiratory: Negative for shortness of breath and wheezing  Cardiovascular: Negative for chest pain and palpitations.  Gastrointestinal: Negative for diarrhea, blood in stool, abdominal distention or other pain Genitourinary: Negative for hematuria, flank pain or change in urine volume.  Musculoskeletal: Negative for myalgias or other joint complaints.  Skin: Negative for color change and wound or drainage.  Neurological: Negative for syncope and numbness. other than noted Hematological: Negative for adenopathy. or other swelling Psychiatric/Behavioral: Negative for hallucinations, SI, self-injury, decreased concentration or other worsening agitation.      Objective:   Physical Exam BP 112/60 mmHg  Pulse 66  Temp(Src) 97.9 F (36.6 C) (Oral)  Resp 20  Wt 112 lb (50.803 kg)  SpO2 98% VS noted,  Constitutional: Pt is oriented to person, place, and time. Appears well-developed and well-nourished, in no significant distress Head:  Normocephalic and atraumatic.  Right Ear: External ear normal.  Left Ear: External ear normal.  Nose: Nose normal.  Mouth/Throat: Oropharynx is clear and moist.  Eyes: Conjunctivae and EOM are normal. Pupils are equal, round, and reactive to light.  Neck: Normal range of motion. Neck supple. No JVD present. No tracheal deviation present or significant neck LA or mass Cardiovascular: Normal rate, regular rhythm, normal heart sounds and intact distal pulses.   Pulmonary/Chest: Effort normal and breath sounds without rales or wheezing  Abdominal: Soft. Bowel sounds are normal. NT. No HSM  Musculoskeletal: Normal range of motion. Exhibits no edema.  Lymphadenopathy:  Has no cervical adenopathy.  Neurological: Pt is alert and oriented to person, place, and time. Pt has normal reflexes. No cranial nerve deficit. Motor grossly intact Skin: Skin is warm and dry. No rash noted.  Psychiatric:  Has normal mood and affect. Behavior is normal.     Assessment & Plan:

## 2016-01-19 NOTE — Patient Instructions (Signed)
Please take all new medication as prescribed - the vesicare for bladder  Please continue all other medications as before, and refills have been done if requested.  Please have the pharmacy call with any other refills you may need.  Please continue your efforts at being more active, low cholesterol diet, and weight control.  You are otherwise up to date with prevention measures today.  Please keep your appointments with your specialists as you may have planned  Please schedule the bone density test before leaving today at the scheduling desk (where you check out)  Please go to the LAB in the Basement (turn left off the elevator) for the tests to be done today  You will be contacted by phone if any changes need to be made immediately.  Otherwise, you will receive a letter about your results with an explanation, but please check with MyChart first.  Please remember to sign up for MyChart if you have not done so, as this will be important to you in the future with finding out test results, communicating by private email, and scheduling acute appointments online when needed.  Please return in 6 months, or sooner if needed

## 2016-01-20 LAB — HEPATIC FUNCTION PANEL
ALBUMIN: 4.5 g/dL (ref 3.5–5.2)
ALT: 14 U/L (ref 0–35)
AST: 19 U/L (ref 0–37)
Alkaline Phosphatase: 65 U/L (ref 39–117)
Bilirubin, Direct: 0.1 mg/dL (ref 0.0–0.3)
TOTAL PROTEIN: 7.3 g/dL (ref 6.0–8.3)
Total Bilirubin: 0.8 mg/dL (ref 0.2–1.2)

## 2016-01-20 LAB — URINALYSIS, ROUTINE W REFLEX MICROSCOPIC
BILIRUBIN URINE: NEGATIVE
HGB URINE DIPSTICK: NEGATIVE
Ketones, ur: NEGATIVE
LEUKOCYTES UA: NEGATIVE
NITRITE: NEGATIVE
PH: 6 (ref 5.0–8.0)
RBC / HPF: NONE SEEN (ref 0–?)
Specific Gravity, Urine: 1.01 (ref 1.000–1.030)
TOTAL PROTEIN, URINE-UPE24: NEGATIVE
URINE GLUCOSE: NEGATIVE
Urobilinogen, UA: 0.2 (ref 0.0–1.0)
WBC, UA: NONE SEEN (ref 0–?)

## 2016-01-20 LAB — LIPID PANEL
CHOL/HDL RATIO: 3
Cholesterol: 183 mg/dL (ref 0–200)
HDL: 60.4 mg/dL (ref >39.00)
LDL Cholesterol: 95 mg/dL (ref 0–99)
NONHDL: 122.73
Triglycerides: 140 mg/dL (ref 0.0–149.0)
VLDL: 28 mg/dL (ref 0.0–40.0)

## 2016-01-20 LAB — BASIC METABOLIC PANEL
BUN: 21 mg/dL (ref 6–23)
CALCIUM: 10.3 mg/dL (ref 8.4–10.5)
CO2: 28 meq/L (ref 19–32)
CREATININE: 0.99 mg/dL (ref 0.40–1.20)
Chloride: 102 mEq/L (ref 96–112)
GFR: 57.94 mL/min — ABNORMAL LOW (ref >60.00)
Glucose, Bld: 90 mg/dL (ref 70–99)
Potassium: 4.1 mEq/L (ref 3.5–5.1)
Sodium: 139 mEq/L (ref 135–145)

## 2016-01-20 LAB — TSH: TSH: 1.34 u[IU]/mL (ref 0.35–4.50)

## 2016-01-23 NOTE — Assessment & Plan Note (Signed)
stable overall by history and exam, recent data reviewed with pt, and pt to continue medical treatment as before,  to f/u any worsening symptoms or concerns SpO2 Readings from Last 3 Encounters:  01/19/16 98%  09/29/15 100%  04/19/15 95%

## 2016-01-23 NOTE — Assessment & Plan Note (Signed)
stable overall by history and exam, recent data reviewed with pt, and pt to continue medical treatment as before,  to f/u any worsening symptoms or concerns Lab Results  Component Value Date   [place] [address] 01/19/2016

## 2016-01-23 NOTE — Assessment & Plan Note (Signed)
stable overall by history and exam, recent data reviewed with pt, and pt to continue medical treatment as before,  to f/u any worsening symptoms or concerns BP Readings from Last 3 Encounters:  01/19/16 112/60  09/29/15 109/58  08/17/15 136/72

## 2016-01-30 ENCOUNTER — Ambulatory Visit (INDEPENDENT_AMBULATORY_CARE_PROVIDER_SITE_OTHER)
Admission: RE | Admit: 2016-01-30 | Discharge: 2016-01-30 | Disposition: A | Discharge status: Home / Self Care | Payer: Medicare Other | Source: Ambulatory Visit | Attending: Internal Medicine | Admitting: Internal Medicine

## 2016-01-30 ENCOUNTER — Ambulatory Visit (INDEPENDENT_AMBULATORY_CARE_PROVIDER_SITE_OTHER): Payer: Medicare Other

## 2016-01-30 VITALS — BP 160/90 | Ht <= 58 in | Wt 110.8 lb

## 2016-01-30 DIAGNOSIS — Z0000 Encounter for general adult medical examination without abnormal findings: Secondary | ICD-10-CM

## 2016-01-30 DIAGNOSIS — M810 Age-related osteoporosis without current pathological fracture: Secondary | ICD-10-CM

## 2016-01-30 NOTE — Patient Instructions (Addendum)
Madeline Mcdaniel , Thank you for taking time to come for your Medicare Wellness Visit. I appreciate your ongoing commitment to your health goals. Please review the following plan we discussed and let me know if I can assist you in the future.   She feels she had memory issues; is planning trip currently and has not failed at task to date. May consider going to a neurologist at some point.  BP trended up today; will check at home and fup;   Will schedule eye exam soon   Shingles vaccination is due; states she took in [place] and added this to the immunization record   These are the goals we discussed: Goals    . maintain health     Would like to maintain memory and health  Staying engaged;        This is a list of the screening recommended for you and due dates:  Health Maintenance  Topic Date Due  . Shingles Vaccine  11/20/1999  . DEXA scan (bone density measurement)  11/19/2004  . Flu Shot  07/03/2016  . Tetanus Vaccine  04/09/2023  . Pneumonia vaccines  Completed     Fall Prevention in the Home  Falls can cause injuries. They can happen to people of all ages. There are many things you can do to make your home safe and to help prevent falls.  WHAT CAN I DO ON THE OUTSIDE OF MY HOME?  Regularly fix the edges of walkways and driveways and fix any cracks.  Remove anything that might make you trip as you walk through a door, such as a raised step or threshold.  Trim any bushes or trees on the path to your home.  Use bright outdoor lighting.  Clear any walking paths of anything that might make someone trip, such as rocks or tools.  Regularly check to see if handrails are loose or broken. Make sure that both sides of any steps have handrails.  Any raised decks and porches should have guardrails on the edges.  Have any leaves, snow, or ice cleared regularly.  Use sand or salt on walking paths during winter.  Clean up any spills in your garage right away. This includes oil or  grease spills. WHAT CAN I DO IN THE BATHROOM?   Use night lights.  Install grab bars by the toilet and in the tub and shower. Do not use towel bars as grab bars.  Use non-skid mats or decals in the tub or shower.  If you need to sit down in the shower, use a plastic, non-slip stool.  Keep the floor dry. Clean up any water that spills on the floor as soon as it happens.  Remove soap buildup in the tub or shower regularly.  Attach bath mats securely with double-sided non-slip rug tape.  Do not have throw rugs and other things on the floor that can make you trip. WHAT CAN I DO IN THE BEDROOM?  Use night lights.  Make sure that you have a light by your bed that is easy to reach.  Do not use any sheets or blankets that are too big for your bed. They should not hang down onto the floor.  Have a firm chair that has side arms. You can use this for support while you get dressed.  Do not have throw rugs and other things on the floor that can make you trip. WHAT CAN I DO IN THE KITCHEN?  Clean up any spills right away.  Avoid walking on wet floors.  Keep items that you use a lot in easy-to-reach places.  If you need to reach something above you, use a strong step stool that has a grab bar.  Keep electrical cords out of the way.  Do not use floor polish or wax that makes floors slippery. If you must use wax, use non-skid floor wax.  Do not have throw rugs and other things on the floor that can make you trip. WHAT CAN I DO WITH MY STAIRS?  Do not leave any items on the stairs.  Make sure that there are handrails on both sides of the stairs and use them. Fix handrails that are broken or loose. Make sure that handrails are as long as the stairways.  Check any carpeting to make sure that it is firmly attached to the stairs. Fix any carpet that is loose or worn.  Avoid having throw rugs at the top or bottom of the stairs. If you do have throw rugs, attach them to the floor with  carpet tape.  Make sure that you have a light switch at the top of the stairs and the bottom of the stairs. If you do not have them, ask someone to add them for you. WHAT ELSE CAN I DO TO HELP PREVENT FALLS?  Wear shoes that:  Do not have high heels.  Have rubber bottoms.  Are comfortable and fit you well.  Are closed at the toe. Do not wear sandals.  If you use a stepladder:  Make sure that it is fully opened. Do not climb a closed stepladder.  Make sure that both sides of the stepladder are locked into place.  Ask someone to hold it for you, if possible.  Clearly mark and make sure that you can see:  Any grab bars or handrails.  First and last steps.  Where the edge of each step is.  Use tools that help you move around (mobility aids) if they are needed. These include:  Canes.  Walkers.  Scooters.  Crutches.  Turn on the lights when you go into a dark area. Replace any light bulbs as soon as they burn out.  Set up your furniture so you have a clear path. Avoid moving your furniture around.  If any of your floors are uneven, fix them.  If there are any pets around you, be aware of where they are.  Review your medicines with your doctor. Some medicines can make you feel dizzy. This can increase your chance of falling. Ask your doctor what other things that you can do to help prevent falls.   This information is not intended to replace advice given to you by your health care provider. Make sure you discuss any questions you have with your health care provider.   Document Released: 09/15/2009 Document Revised: 04/05/2015 Document Reviewed: 12/24/2014 Elsevier Interactive Patient Education 2016 [employer] Maintenance, Female Adopting a healthy lifestyle and getting preventive care can go a long way to promote health and wellness. Talk with your health care provider about what schedule of regular examinations is right for you. This is a good chance  for you to check in with your provider about disease prevention and staying healthy. In between checkups, there are plenty of things you can do on your own. Experts have done a lot of research about which lifestyle changes and preventive measures are most likely to keep you healthy. Ask your health care provider for more information. WEIGHT AND  DIET  Eat a healthy diet  Be sure to include plenty of vegetables, fruits, low-fat dairy products, and lean protein.  Do not eat a lot of foods high in solid fats, added sugars, or salt.  Get regular exercise. This is one of the most important things you can do for your health.  Most adults should exercise for at least 150 minutes each week. The exercise should increase your heart rate and make you sweat (moderate-intensity exercise).  Most adults should also do strengthening exercises at least twice a week. This is in addition to the moderate-intensity exercise.  Maintain a healthy weight  Body mass index (BMI) is a measurement that can be used to identify possible weight problems. It estimates body fat based on height and weight. Your health care provider can help determine your BMI and help you achieve or maintain a healthy weight.  For females 19 years of age and older:   A BMI below 18.5 is considered underweight.  A BMI of 18.5 to 24.9 is normal.  A BMI of 25 to 29.9 is considered overweight.  A BMI of 30 and above is considered obese.  Watch levels of cholesterol and blood lipids  You should start having your blood tested for lipids and cholesterol at 77 years of age, then have this test every 5 years.  You may need to have your cholesterol levels checked more often if:  Your lipid or cholesterol levels are high.  You are older than 77 years of age.  You are at high risk for heart disease.  CANCER SCREENING   Lung Cancer  Lung cancer screening is recommended for adults 58-82 years old who are at high risk for lung cancer  because of a history of smoking.  A yearly low-dose CT scan of the lungs is recommended for people who:  Currently smoke.  Have quit within the past 15 years.  Have at least a 30-pack-year history of smoking. A pack year is smoking an average of one pack of cigarettes a day for 1 year.  Yearly screening should continue until it has been 15 years since you quit.  Yearly screening should stop if you develop a health problem that would prevent you from having lung cancer treatment.  Breast Cancer  Practice breast self-awareness. This means understanding how your breasts normally appear and feel.  It also means doing regular breast self-exams. Let your health care provider know about any changes, no matter how small.  If you are in your 20s or 30s, you should have a clinical breast exam (CBE) by a health care provider every 1-3 years as part of a regular health exam.  If you are 13 or older, have a CBE every year. Also consider having a breast X-ray (mammogram) every year.  If you have a family history of breast cancer, talk to your health care provider about genetic screening.  If you are at high risk for breast cancer, talk to your health care provider about having an MRI and a mammogram every year.  Breast cancer gene (BRCA) assessment is recommended for women who have family members with BRCA-related cancers. BRCA-related cancers include:  Breast.  Ovarian.  Tubal.  Peritoneal cancers.  Results of the assessment will determine the need for genetic counseling and BRCA1 and BRCA2 testing. Cervical Cancer Your health care provider may recommend that you be screened regularly for cancer of the pelvic organs (ovaries, uterus, and vagina). This screening involves a pelvic examination, including checking for microscopic  changes to the surface of your cervix (Pap test). You may be encouraged to have this screening done every 3 years, beginning at age 52.  For women ages 65-65,  health care providers may recommend pelvic exams and Pap testing every 3 years, or they may recommend the Pap and pelvic exam, combined with testing for human papilloma virus (HPV), every 5 years. Some types of HPV increase your risk of cervical cancer. Testing for HPV may also be done on women of any age with unclear Pap test results.  Other health care providers may not recommend any screening for nonpregnant women who are considered low risk for pelvic cancer and who do not have symptoms. Ask your health care provider if a screening pelvic exam is right for you.  If you have had past treatment for cervical cancer or a condition that could lead to cancer, you need Pap tests and screening for cancer for at least 20 years after your treatment. If Pap tests have been discontinued, your risk factors (such as having a new sexual partner) need to be reassessed to determine if screening should resume. Some women have medical problems that increase the chance of getting cervical cancer. In these cases, your health care provider may recommend more frequent screening and Pap tests. Colorectal Cancer  This type of cancer can be detected and often prevented.  Routine colorectal cancer screening usually begins at 77 years of age and continues through 77 years of age.  Your health care provider may recommend screening at an earlier age if you have risk factors for colon cancer.  Your health care provider may also recommend using home test kits to check for hidden blood in the stool.  A small camera at the end of a tube can be used to examine your colon directly (sigmoidoscopy or colonoscopy). This is done to check for the earliest forms of colorectal cancer.  Routine screening usually begins at age 27.  Direct examination of the colon should be repeated every 5-10 years through 77 years of age. However, you may need to be screened more often if early forms of precancerous polyps or small growths are  found. Skin Cancer  Check your skin from head to toe regularly.  Tell your health care provider about any new moles or changes in moles, especially if there is a change in a mole's shape or color.  Also tell your health care provider if you have a mole that is larger than the size of a pencil eraser.  Always use sunscreen. Apply sunscreen liberally and repeatedly throughout the day.  Protect yourself by wearing long sleeves, pants, a wide-brimmed hat, and sunglasses whenever you are outside. HEART DISEASE, DIABETES, AND HIGH BLOOD PRESSURE   High blood pressure causes heart disease and increases the risk of stroke. High blood pressure is more likely to develop in:  People who have blood pressure in the high end of the normal range (130-139/85-89 mm Hg).  People who are overweight or obese.  People who are African American.  If you are 69-60 years of age, have your blood pressure checked every 3-5 years. If you are 30 years of age or older, have your blood pressure checked every year. You should have your blood pressure measured twice--once when you are at a hospital or clinic, and once when you are not at a hospital or clinic. Record the average of the two measurements. To check your blood pressure when you are not at a hospital or clinic, you  can use:  An automated blood pressure machine at a pharmacy.  A home blood pressure monitor.  If you are between 79 years and 66 years old, ask your health care provider if you should take aspirin to prevent strokes.  Have regular diabetes screenings. This involves taking a blood sample to check your fasting blood sugar level.  If you are at a normal weight and have a low risk for diabetes, have this test once every three years after 77 years of age.  If you are overweight and have a high risk for diabetes, consider being tested at a younger age or more often. PREVENTING INFECTION  Hepatitis B  If you have a higher risk for hepatitis B,  you should be screened for this virus. You are considered at high risk for hepatitis B if:  You were born in a country where hepatitis B is common. Ask your health care provider which countries are considered high risk.  Your parents were born in a high-risk country, and you have not been immunized against hepatitis B (hepatitis B vaccine).  You have HIV or AIDS.  You use needles to inject street drugs.  You live with someone who has hepatitis B.  You have had sex with someone who has hepatitis B.  You get hemodialysis treatment.  You take certain medicines for conditions, including cancer, organ transplantation, and autoimmune conditions. Hepatitis C  Blood testing is recommended for:  Everyone born from [birth year] through 1967.  Anyone with known risk factors for hepatitis C. Sexually transmitted infections (STIs)  You should be screened for sexually transmitted infections (STIs) including gonorrhea and chlamydia if:  You are sexually active and are younger than 77 years of age.  You are older than 77 years of age and your health care provider tells you that you are at risk for this type of infection.  Your sexual activity has changed since you were last screened and you are at an increased risk for chlamydia or gonorrhea. Ask your health care provider if you are at risk.  If you do not have HIV, but are at risk, it may be recommended that you take a prescription medicine daily to prevent HIV infection. This is called pre-exposure prophylaxis (PrEP). You are considered at risk if:  You are sexually active and do not regularly use condoms or know the HIV status of your partner(s).  You take drugs by injection.  You are sexually active with a partner who has HIV. Talk with your health care provider about whether you are at high risk of being infected with HIV. If you choose to begin PrEP, you should first be tested for HIV. You should then be tested every 3 months for as long as  you are taking PrEP.  PREGNANCY   If you are premenopausal and you may become pregnant, ask your health care provider about preconception counseling.  If you may become pregnant, take 400 to 800 micrograms (mcg) of folic acid every day.  If you want to prevent pregnancy, talk to your health care provider about birth control (contraception). OSTEOPOROSIS AND MENOPAUSE   Osteoporosis is a disease in which the bones lose minerals and strength with aging. This can result in serious bone fractures. Your risk for osteoporosis can be identified using a bone density scan.  If you are 23 years of age or older, or if you are at risk for osteoporosis and fractures, ask your health care provider if you should be screened.  Ask your  health care provider whether you should take a calcium or vitamin D supplement to lower your risk for osteoporosis.  Menopause may have certain physical symptoms and risks.  Hormone replacement therapy may reduce some of these symptoms and risks. Talk to your health care provider about whether hormone replacement therapy is right for you.  HOME CARE INSTRUCTIONS   Schedule regular health, dental, and eye exams.  Stay current with your immunizations.   Do not use any tobacco products including cigarettes, chewing tobacco, or electronic cigarettes.  If you are pregnant, do not drink alcohol.  If you are breastfeeding, limit how much and how often you drink alcohol.  Limit alcohol intake to no more than 1 drink per day for nonpregnant women. One drink equals 12 ounces of beer, 5 ounces of wine, or 1 ounces of hard liquor.  Do not use street drugs.  Do not share needles.  Ask your health care provider for help if you need support or information about quitting drugs.  Tell your health care provider if you often feel depressed.  Tell your health care provider if you have ever been abused or do not feel safe at home.   This information is not intended to replace  advice given to you by your health care provider. Make sure you discuss any questions you have with your health care provider.   Document Released: 06/04/2011 Document Revised: 12/10/2014 Document Reviewed: 10/21/2013 Elsevier Interactive Patient Education [employer].

## 2016-01-30 NOTE — Progress Notes (Signed)
Subjective:   Madeline Mcdaniel is a 75 y.o. female who presents for Medicare Annual (Subsequent) preventive examination.  Review of Systems  HRA assessment completed during visit; [place]  The Patient was informed that this wellness visit is to identify risk and educate on how to reduce risk for increase disease through lifestyle changes.   ROS deferred to CPE exam with physician  Medical issues  HTN HCTZ manages; takes when she gets stressed   Has 2 sons; 2 grandchildren are boys   BMI:24 Diet; was 145 x 1 year ago; had gallbladder; and liver was making bile salts  Cholestyramine (questran) to complete  Burned from the bile salts  Breakfast; eat cereal; pancakes and sausage Lunch; we eat soup or sandwich Supper; cooks  Exercise; They go to the senior center; part of [place] and recreation; Senior center;  [employer] 2 miles 3 days a week and they have a work out room.   Bipolar and taking gabapentin;  Now managed on gabapentin; doing well    SAFETY; has upstairs; live downstairs;  Removal of clutter clearing paths through the home,  Stools over home because she is short; Fell off stool approx. 75 yo; ? small stroke and concussion when young Community safety; 'for the most part'  Smoke detectors: yes Firearms safety; does not own firearms;  Driving accidents and seatbelt/no  Sun protection/ yes;  Stressors; family discord between sons; worries over grandchildren and sons  Medication review/ no issues noted today  Fall assessment / no   Mobilization and Functional losses in the last year; my memory; my memory  Sleep patterns; sleeps at hs  Goes to bed at MN and up at 7am   Urinary or fecal incontinence reviewed/ bowel or bladder issues Constant diarrhea; vesicare helping bladder   Counseling: Colonoscopy; moved here 75 yo and had colonoscopy at that time' aged out EKG: 04/2015 Hearing: 4000 hz right; 2000hz  left ear/   Division of hearing does pay for one hearing  aid  Ophthalmology exam; TBS this year Immunizations / had shingles in [place] and was added to immunization record   Current Care Team reviewed and updated  Completed AD; spoke to family regarding wishes; but completed as they have made decisions they want carried out        Objective:     Vitals: BP 160/90 mmHg  Ht 4\' 9"  (1.448 m)  Wt 110 lb 12 oz (50.236 kg)  BMI 23.96 kg/m2  Tobacco History  Smoking status  . Never Smoker   Smokeless tobacco  . Never Used     Counseling given: Not Answered   Past Medical History  Diagnosis Date  . Asthma 04/27/2011  . Degenerative joint disease 04/27/2011  . Bipolar affective disorder ([place]) 04/27/2011  . Chronic bronchitis 04/27/2011  . GERD (gastroesophageal reflux disease) 04/27/2011  . Allergic rhinitis, cause unspecified 04/27/2011  . HTN (hypertension) 04/27/2011  . Chronic cystitis 04/27/2011  . Colon polyps 04/27/2011  . Anemia 04/27/2011  . TIA (transient ischemic attack) 04/27/2011  . Urinary incontinence 04/27/2011  . 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 Hyperlipidemia 04/27/2011  . Osteopenia 06/01/2012  . Cervical spondylosis 06/01/2012  . Right carpal tunnel syndrome 07/22/2012  . Mild cognitive impairment 04/12/2013   Past Surgical History  Procedure Laterality Date  . Colonoscopy  67YRS AGO    IN [place],PT DOES NOT REMEMBER DOCTOR  . Orthoscopic carpetomy      BOTH THUMBS  . Trigger finger release  LEFT RING FINGER  . Bunionectomy      LEFT FOOT  . Total abdominal hysterectomy    . Tonsillectomy    . Bladder suspension      A&P REPAIR WITH MESH  . Steriotactic breast biopsy      RIGHT  . Breast lumpectomy      SEVERAL TIMES  . Polypectomy  10 YRS AGO    BENIGN POLYPS X2  . Tendon transfer  12/09/2012    Procedure: TENDON TRANSFER;  Surgeon: Wynonia Sours, MD;  Location: [facility];  Service: Orthopedics;  Laterality: Left;  Tenodesis PIP with FDS Slip Left ring finger, [place]ug[place]  . Cholecystectomy    .  Mcbride bunionectomy Left 09/09/2014    @ [facility]  . Neurectomy foot Left 09/09/2014    @ [facility]   Family History  Problem Relation Age of Onset  . Cancer Mother 14    RENAL CELL CARCINOMA  . Colon cancer Paternal Grandfather    History  Sexual Activity  . Sexual Activity: Not Currently    Outpatient Encounter Prescriptions as of 01/30/2016  Medication Sig  . acetaminophen (TYLENOL) 500 MG tablet Take 500 mg by mouth as needed.    Marland Kitchen BIOTIN PO Take 3,000 mcg by mouth daily.  Marland Kitchen CALCIUM PO Take by mouth as directed. Reported on 01/30/2016  . cholestyramine (QUESTRAN) 4 G packet TAKE 1 PACKET BY MOUTH TWICE DAILY(2 HOURS APART FROM ALL OTHER MEDS)  . cholestyramine (QUESTRAN) 4 g packet TAKE 1 PACKET BY MOUTH TWICE DAILY(2 HOURS APART FROM ALL OTHER MEDS)  . Cranberry 500 MG CAPS Take by mouth 2 (two) times daily.  . Fluticasone-Salmeterol (ADVAIR DISKUS IN) Inhale 1 Inhaler into the lungs as needed. 100/50  . gabapentin (NEURONTIN) 300 MG capsule TAKE ONE CAPSULE BY MOUTH EVERY MORNING AND TAKE 2 CAPSULES BY MOUTH EVERY EVENING  . guaiFENesin (MUCINEX) 600 MG 12 hr tablet Take 600 mg by mouth as directed.  . hydrochlorothiazide (HYDRODIURIL) 12.5 MG tablet TAKE 1 TABLET BY MOUTH DAILY  . lisinopril (PRINIVIL,ZESTRIL) 10 MG tablet 1/2 tab by mouth twice per day  . PROAIR HFA 108 (90 BASE) MCG/ACT inhaler INHALE 2 PUFFS INTO THE LUNGS EVERY 6 HOURS AS NEEDED FOR WHEEZING OR SHORTNESS OF BREATH  . ranitidine (ZANTAC 75) 75 MG tablet Take 1 tablet (75 mg total) by mouth 2 (two) times daily.  . solifenacin (VESICARE) 5 MG tablet Take 1 tablet (5 mg total) by mouth daily.  . Coenzyme Q10 (CO Q 10 PO) Take 200 mg by mouth daily.     No facility-administered encounter medications on file as of 01/30/2016.    Activities of Daily Living In your present state of health, do you have any difficulty performing the following activities: 01/30/2016  Hearing? N  Vision? N  Difficulty  concentrating or making decisions? Y  Walking or climbing stairs? N  Dressing or bathing? N  Doing errands, shopping? N  Preparing Food and eating ? N  Using the Toilet? N  In the past six months, have you accidently leaked urine? (No Data)  Do you have problems with loss of bowel control? N  Managing your Medications? N  Managing your Finances? N  Housekeeping or managing your Housekeeping? N    Patient Care Team: Biagio Borg, MD as PCP - General (Internal Medicine)    Assessment:    Assessment   Patient presents for yearly preventative medicine examination. Medicare questionnaire screening were completed, i.e.  Functional; fall risk; depression, memory loss and hearing were unremarkable;  Some sadness expressed over family issues  All immunizations and health maintenance protocols were reviewed with the patient and needed orders were placed; had dexa scan today;   Education provided for laboratory screens;    Medication reconciliation, past medical history, social history, problem list and allergies were reviewed in detail with the patient  Goals were established with regard to weight loss, exercise, and diet in compliance with medications based on the patient individualized risk;   End of life planning was discussed and the patient is in process of completing   Exercise Activities and Dietary recommendations Current Exercise Habits:: Home exercise routine, Type of exercise: walking;strength training/weights, Time (Minutes): 60, Frequency (Times/Week): 3, Weekly Exercise (Minutes/Week): 180, Intensity: Moderate  Goals    . maintain health     Would like to maintain memory and health  Staying engaged;       Fall Risk Fall Risk  01/30/2016 01/19/2016 01/19/2016 04/19/2015 08/25/2014  Falls in the past year? No No No Yes Yes  Number falls in past yr: - - - 1 1  Injury with Fall? - - - No No   Depression Screen PHQ 2/9 Scores 01/30/2016 01/19/2016 01/19/2016 04/19/2015    PHQ - 2 Score 0 0 1 0    Tears up easily when talking about sons and grand children. Agreed to call her Psych doctor to discuss if needed;   Cognitive Testing MMSE - Mini Mental State Exam 01/30/2016 01/30/2016 01/19/2016  Not completed: (No Data) - (No Data)  Orientation to time 5 5 -  Orientation to Place 5 5 -  Registration - 3 -  Attention/ Calculation 3 3 -  Recall 3 3 -  Language- name 2 objects 2 2 -  Language- repeat 1 1 -  Language- follow 3 step command 3 1 -  Language- read & follow direction 1 1 -  Write a sentence 1 1 -  Copy design 1 1 -  Total score - 26 -   SCORE IS IN ERROR: 28/ 30; followed command x 3 Missed 2/5 math calculations and is concerned about recall and new learning is less; could not learn how to use a computer  Cannot write as before; This concerns her;  Discussed neurologist but feels she will wait on this;    Immunization History  Administered Date(s) Administered  . DTaP 09/18/2009  . Influenza Split 08/30/2011, 09/16/2012  . Influenza,inj,Quad PF,36+ Mos 08/25/2013, 08/25/2014, 08/16/2015  . Pneumococcal Conjugate-13 12/03/2006, 10/01/2013  . Pneumococcal Polysaccharide-23 12/03/2009  . Tetanus 04/08/2013  . Zoster 12/03/2009   Screening Tests Health Maintenance  Topic Date Due  . ZOSTAVAX  11/20/1999  . DEXA SCAN  11/19/2004  . INFLUENZA VACCINE  07/03/2016  . TETANUS/TDAP  04/09/2023  . PNA vac Low Risk Adult  Completed      Plan:   She feels she had memory issues; is planning trip currently and has not failed at task to date. May consider going to a neurologist at some point but declines referral today  Did become teary when discussing family. She agreed to call psych and make arrangements to see the one recommended in case she would need anything. Also may benefit from grief work and talking through family issues that still  Concern her.   BP trended up today; No issue; states it is low at home 110/70 but will recheck tonight  and later this week; States she does not take her  BP medicine every day;   Will schedule eye exam soon   Shingles vaccination is due but states she did take this in [place]; will put in historical record  During the course of the visit the patient was educated and counseled about the following appropriate screening and preventive services:   Vaccines to include Pneumoccal, Influenza, Hepatitis B, Td, Zostavax, HCV  Electrocardiogram 04/2015  Cardiovascular Disease/ will check BP at home; generally low  Colorectal cancer screening/ aged out  Bone density screening/ completed today;awaiting results  Diabetes screening/n/a  Glaucoma screening/to have eyes screened soon  Mammography/PAP/ fup per Dr. Jenny Reichmann no evidence of breast mass  Nutrition counseling / eating now; weight stable on medication that controls diarrhea  Patient Instructions (the written plan) was given to the patient.   Wynetta Fines, RN  01/30/2016

## 2016-02-08 ENCOUNTER — Other Ambulatory Visit: Payer: Self-pay | Admitting: Internal Medicine

## 2016-02-08 MED ORDER — ALENDRONATE SODIUM 70 MG PO TABS: 70.0000 mg | ORAL_TABLET | ORAL | Status: DC

## 2016-02-14 ENCOUNTER — Ambulatory Visit
Admission: RE | Admit: 2016-02-14 | Discharge: 2016-02-14 | Disposition: A | Discharge status: Home / Self Care | Payer: Medicare Other | Source: Ambulatory Visit | Attending: Internal Medicine | Admitting: Internal Medicine

## 2016-02-14 ENCOUNTER — Other Ambulatory Visit: Payer: Self-pay | Admitting: Internal Medicine

## 2016-02-14 DIAGNOSIS — R928 Other abnormal and inconclusive findings on diagnostic imaging of breast: Secondary | ICD-10-CM

## 2016-02-14 DIAGNOSIS — N6489 Other specified disorders of breast: Secondary | ICD-10-CM | POA: Diagnosis not present

## 2016-02-14 DIAGNOSIS — R922 Inconclusive mammogram: Secondary | ICD-10-CM | POA: Diagnosis not present

## 2016-03-01 DIAGNOSIS — H2513 Age-related nuclear cataract, bilateral: Secondary | ICD-10-CM | POA: Diagnosis not present

## 2016-03-01 DIAGNOSIS — H04123 Dry eye syndrome of bilateral lacrimal glands: Secondary | ICD-10-CM | POA: Diagnosis not present

## 2016-03-07 ENCOUNTER — Telehealth: Payer: Self-pay

## 2016-03-07 MED ORDER — GABAPENTIN 300 MG PO CAPS: ORAL_CAPSULE | ORAL | Status: DC

## 2016-03-07 NOTE — Telephone Encounter (Signed)
Please advise patient is stating that pharmacy did not receive refill for Gabapentin

## 2016-03-07 NOTE — Addendum Note (Signed)
Addended by: Biagio Borg on: 03/07/2016 05:58 PM   Modules accepted: Orders

## 2016-03-07 NOTE — Telephone Encounter (Signed)
rx done erx 

## 2016-05-30 ENCOUNTER — Other Ambulatory Visit: Payer: Self-pay | Admitting: Internal Medicine

## 2016-07-20 ENCOUNTER — Other Ambulatory Visit: Payer: Self-pay | Admitting: Internal Medicine

## 2016-07-20 DIAGNOSIS — Z1231 Encounter for screening mammogram for malignant neoplasm of breast: Secondary | ICD-10-CM

## 2016-07-31 ENCOUNTER — Encounter: Payer: Self-pay | Admitting: Internal Medicine

## 2016-07-31 ENCOUNTER — Ambulatory Visit (INDEPENDENT_AMBULATORY_CARE_PROVIDER_SITE_OTHER): Payer: Medicare Other | Admitting: Internal Medicine

## 2016-07-31 VITALS — BP 116/64 | HR 71 | Temp 98.2°F | Resp 20 | Wt 107.0 lb

## 2016-07-31 DIAGNOSIS — Z23 Encounter for immunization: Secondary | ICD-10-CM | POA: Diagnosis not present

## 2016-07-31 DIAGNOSIS — Z1211 Encounter for screening for malignant neoplasm of colon: Secondary | ICD-10-CM | POA: Diagnosis not present

## 2016-07-31 DIAGNOSIS — R634 Abnormal weight loss: Secondary | ICD-10-CM

## 2016-07-31 DIAGNOSIS — E785 Hyperlipidemia, unspecified: Secondary | ICD-10-CM

## 2016-07-31 DIAGNOSIS — I10 Essential (primary) hypertension: Secondary | ICD-10-CM

## 2016-07-31 NOTE — Assessment & Plan Note (Signed)
Mild persistent , recent eval neg with labs, egd, and exam o/w benign today, pt is due for colonoscopy, will refer

## 2016-07-31 NOTE — Progress Notes (Signed)
Subjective:    Patient ID: Madeline Mcdaniel, female    DOB: [birth year], 75 y.o.   MRN: [id number]  HPI  Here to f/u; overall doing ok,  Pt denies chest pain, increasing sob or doe, wheezing, orthopnea, PND, increased LE swelling, palpitations, dizziness or syncope.  Pt denies new neurological symptoms such as new headache, or facial or extremity weakness or numbness.  Pt denies polydipsia, polyuria, or low sugar episode.   Pt denies new neurological symptoms such as new headache, or facial or extremity weakness or numbness.   Pt states overall good compliance with meds, mostly trying to follow appropriate diet, with wt overall stable,  And walks 2 miles 3 times per wk..  Has lost approx 210 lbs over the past yr. S/p EGD oct 2016 -  No malignancy. Has not had recent colonoscopy, last about 10 yrs ago just prior to move here to be with family Wt Readings from Last 3 Encounters:  07/31/16 107 lb (48.5 kg)  01/30/16 110 lb 12 oz (50.2 kg)  01/19/16 112 lb (50.8 kg)  Still getting q 6 mo diag mammograms due to dense breasts.  Does not take BP med every day as BP often low with wt loss, and gets dizzy, staggery.  Denies worsening reflux, abd pain, dysphagia, n/v, bowel change or blood. Past Medical History:  Diagnosis Date  . Allergic rhinitis, cause unspecified 04/27/2011  . Anemia 04/27/2011  . Asthma 04/27/2011  . Bipolar affective disorder ([place]) 04/27/2011  . Cervical spondylosis 06/01/2012  . Chronic bronchitis 04/27/2011  . Chronic cystitis 04/27/2011  . Colon polyps 04/27/2011  . Degenerative joint disease 04/27/2011  . GERD (gastroesophageal reflux disease) 04/27/2011  . HTN (hypertension) 04/27/2011  . Hyperlipidemia 04/27/2011  . Mild cognitive impairment 04/12/2013  . Osteopenia 06/01/2012  . 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 Right carpal tunnel syndrome 07/22/2012  . TIA (transient ischemic attack) 04/27/2011  . Urinary incontinence 04/27/2011   Past Surgical History:  Procedure Laterality Date  .  BLADDER SUSPENSION     A&P REPAIR WITH MESH  . BREAST LUMPECTOMY     SEVERAL TIMES  . BUNIONECTOMY     LEFT FOOT  . CHOLECYSTECTOMY    . COLONOSCOPY  83YRS AGO   IN [place],PT DOES NOT REMEMBER DOCTOR  . Lauro Regulus Bunionectomy Left 09/09/2014   @ [facility]  . NEURECTOMY FOOT Left 09/09/2014   @ [place] [facility]  . ORTHOSCOPIC CARPETOMY     BOTH THUMBS  . POLYPECTOMY  10 YRS AGO   BENIGN POLYPS X2  . STERIOTACTIC BREAST BIOPSY     RIGHT  . TENDON TRANSFER  12/09/2012   Procedure: TENDON TRANSFER;  Surgeon: Wynonia Sours, MD;  Location: [facility];  Service: Orthopedics;  Laterality: Left;  Tenodesis PIP with FDS Slip Left ring finger, [place]  . TONSILLECTOMY    . TOTAL ABDOMINAL HYSTERECTOMY    . TRIGGER FINGER RELEASE     LEFT RING FINGER    reports that she has never smoked. She has never used smokeless tobacco. She reports that she does not drink alcohol or use drugs. family history includes Cancer (age of onset: 24) in her mother; Colon cancer in her paternal grandfather. Allergies  Allergen Reactions  . Aspirin Anaphylaxis  . Nsaids Anaphylaxis    BECAUSE OF REACTION TO VIOXX BECAUSE OF REACTION TO VIOXX  . Vioxx [Rofecoxib] Anaphylaxis  . Ciprofloxacin Other (See Comments)    HEADACHE HEADACHE   Current Outpatient Prescriptions on File  Prior to Visit  Medication Sig Dispense Refill  . acetaminophen (TYLENOL) 500 MG tablet Take 500 mg by mouth as needed.      Marland Kitchen alendronate (FOSAMAX) 70 MG tablet Take 1 tablet (70 mg total) by mouth every 7 (seven) days. Take with a full glass of water on an empty stomach. 12 tablet 3  . BIOTIN PO Take 3,000 mcg by mouth daily.    Marland Kitchen CALCIUM PO Take by mouth as directed. Reported on 01/30/2016    . cholestyramine (QUESTRAN) 4 G packet TAKE 1 PACKET BY MOUTH TWICE DAILY(2 HOURS APART FROM ALL OTHER MEDS) 180 packet 0  . cholestyramine (QUESTRAN) 4 g packet TAKE 1 PACKET BY MOUTH TWICE DAILY(2 HOURS APART FROM ALL OTHER  MEDS) 180 packet 0  . Coenzyme Q10 (CO Q 10 PO) Take 200 mg by mouth daily.      . Cranberry 500 MG CAPS Take by mouth 2 (two) times daily.    . Fluticasone-Salmeterol (ADVAIR DISKUS IN) Inhale 1 Inhaler into the lungs as needed. 100/50    . gabapentin (NEURONTIN) 300 MG capsule TAKE ONE CAPSULE BY MOUTH EVERY MORNING AND TAKE 2 CAPSULES BY MOUTH EVERY EVENING 90 capsule 5  . guaiFENesin (MUCINEX) 600 MG 12 hr tablet Take 600 mg by mouth as directed.    . hydrochlorothiazide (HYDRODIURIL) 12.5 MG tablet TAKE 1 TABLET BY MOUTH DAILY 90 tablet 0  . PROAIR HFA 108 (90 BASE) MCG/ACT inhaler INHALE 2 PUFFS INTO THE LUNGS EVERY 6 HOURS AS NEEDED FOR WHEEZING OR SHORTNESS OF BREATH 8.5 g 1  . ranitidine (ZANTAC 75) 75 MG tablet Take 1 tablet (75 mg total) by mouth 2 (two) times daily. 60 tablet 2  . solifenacin (VESICARE) 5 MG tablet Take 1 tablet (5 mg total) by mouth daily. 90 tablet 3   No current facility-administered medications on file prior to visit.    Review of Systems  Constitutional: Negative for unusual diaphoresis or night sweats HENT: Negative for ear swelling or discharge Eyes: Negative for worsening visual haziness  Respiratory: Negative for choking and stridor.   Gastrointestinal: Negative for distension or worsening eructation Genitourinary: Negative for retention or change in urine volume.  Musculoskeletal: Negative for other MSK pain or swelling Skin: Negative for color change and worsening wound Neurological: Negative for tremors and numbness other than noted  Psychiatric/Behavioral: Negative for decreased concentration or agitation other than above       Objective:   Physical Exam BP 116/64   Pulse 71   Temp 98.2 F (36.8 C) (Oral)   Resp 20   Wt 107 lb (48.5 kg)   SpO2 95%   BMI 23.15 kg/m  VS noted,  Constitutional: Pt appears in no apparent distress HENT: Head: NCAT.  Right Ear: External ear normal.  Left Ear: External ear normal.  Eyes: . Pupils are equal,  round, and reactive to light. Conjunctivae and EOM are normal Neck: Normal range of motion. Neck supple.  Cardiovascular: Normal rate and regular rhythm.   Pulmonary/Chest: Effort normal and breath sounds without rales or wheezing.  Abd:  Soft, NT, ND, + BS Neurological: Pt is alert. Not confused , motor grossly intact Skin: Skin is warm. No rash, no LE edema Psychiatric: Pt behavior is normal. No agitation.   Lab Results  Component Value Date   WBC 8.5 01/19/2016   HGB 13.5 01/19/2016   HCT 40.4 01/19/2016   PLT 140.0 (L) 01/19/2016   GLUCOSE 90 01/19/2016   CHOL 183 01/19/2016  TRIG 140.0 01/19/2016   HDL 60.40 01/19/2016   LDLDIRECT 113.9 04/08/2013   LDLCALC 95 01/19/2016   ALT 14 01/19/2016   AST 19 01/19/2016   NA 139 01/19/2016   K 4.1 01/19/2016   CL 102 01/19/2016   CREATININE 0.99 01/19/2016   BUN 21 01/19/2016   CO2 28 01/19/2016   TSH 1.34 01/19/2016         Assessment & Plan:

## 2016-07-31 NOTE — Progress Notes (Signed)
Pre visit review using our clinic review tool, if applicable. No additional management support is needed unless otherwise documented below in the visit note. 

## 2016-07-31 NOTE — Patient Instructions (Addendum)
You had the flu shot today  You will be contacted regarding the referral for: colonoscopy  Please continue all other medications as before, and refills have been done if requested.  Please have the pharmacy call with any other refills you may need.  Please continue your efforts at being more active, low cholesterol diet, and weight control.  You are otherwise up to date with prevention measures today.  Please keep your appointments with your specialists as you may have planned  Please return in 6 months, or sooner if needed

## 2016-07-31 NOTE — Assessment & Plan Note (Signed)
overcontrolled by hx, ok to stop the lisinopril, . to f/u any worsening symptoms or concerns

## 2016-07-31 NOTE — Assessment & Plan Note (Signed)
stable overall by history and exam, recent data reviewed with pt, and pt to continue medical treatment as before,  to f/u any worsening symptoms or concerns Lab Results  Component Value Date   [place] [address] 01/19/2016

## 2016-08-22 ENCOUNTER — Ambulatory Visit
Admission: RE | Admit: 2016-08-22 | Discharge: 2016-08-22 | Disposition: A | Discharge status: Home / Self Care | Payer: Medicare Other | Source: Ambulatory Visit | Attending: Internal Medicine | Admitting: Internal Medicine

## 2016-08-22 DIAGNOSIS — Z1231 Encounter for screening mammogram for malignant neoplasm of breast: Secondary | ICD-10-CM | POA: Diagnosis not present

## 2016-09-25 ENCOUNTER — Other Ambulatory Visit: Payer: Self-pay | Admitting: Internal Medicine

## 2016-09-25 NOTE — Telephone Encounter (Signed)
Gabapentin done erx  

## 2016-09-27 ENCOUNTER — Other Ambulatory Visit: Payer: Self-pay | Admitting: Internal Medicine

## 2016-10-24 DIAGNOSIS — N3090 Cystitis, unspecified without hematuria: Secondary | ICD-10-CM | POA: Diagnosis not present

## 2016-10-24 DIAGNOSIS — Z87440 Personal history of urinary (tract) infections: Secondary | ICD-10-CM | POA: Diagnosis not present

## 2016-11-03 ENCOUNTER — Other Ambulatory Visit: Payer: Self-pay | Admitting: Internal Medicine

## 2016-12-11 ENCOUNTER — Other Ambulatory Visit: Payer: Self-pay | Admitting: Internal Medicine

## 2017-01-06 ENCOUNTER — Other Ambulatory Visit: Payer: Self-pay | Admitting: Internal Medicine

## 2017-01-30 ENCOUNTER — Encounter: Payer: Self-pay | Admitting: Internal Medicine

## 2017-01-30 ENCOUNTER — Ambulatory Visit (INDEPENDENT_AMBULATORY_CARE_PROVIDER_SITE_OTHER): Payer: Medicare Other | Admitting: Internal Medicine

## 2017-01-30 ENCOUNTER — Telehealth: Payer: Self-pay | Admitting: Emergency Medicine

## 2017-01-30 ENCOUNTER — Other Ambulatory Visit: Payer: Self-pay | Admitting: Internal Medicine

## 2017-01-30 ENCOUNTER — Other Ambulatory Visit (INDEPENDENT_AMBULATORY_CARE_PROVIDER_SITE_OTHER): Payer: Medicare Other

## 2017-01-30 ENCOUNTER — Ambulatory Visit (INDEPENDENT_AMBULATORY_CARE_PROVIDER_SITE_OTHER)
Admission: RE | Admit: 2017-01-30 | Discharge: 2017-01-30 | Disposition: A | Discharge status: Home / Self Care | Payer: Medicare Other | Source: Ambulatory Visit | Attending: Internal Medicine | Admitting: Internal Medicine

## 2017-01-30 VITALS — BP 116/72 | HR 52 | Temp 97.7°F | Ht <= 58 in | Wt 100.0 lb

## 2017-01-30 DIAGNOSIS — R413 Other amnesia: Secondary | ICD-10-CM

## 2017-01-30 DIAGNOSIS — I10 Essential (primary) hypertension: Secondary | ICD-10-CM

## 2017-01-30 DIAGNOSIS — R634 Abnormal weight loss: Secondary | ICD-10-CM

## 2017-01-30 DIAGNOSIS — E785 Hyperlipidemia, unspecified: Secondary | ICD-10-CM

## 2017-01-30 LAB — URINALYSIS, ROUTINE W REFLEX MICROSCOPIC
BILIRUBIN URINE: NEGATIVE
Hgb urine dipstick: NEGATIVE
Ketones, ur: NEGATIVE
Nitrite: NEGATIVE
PH: 6 (ref 5.0–8.0)
Specific Gravity, Urine: 1.02 (ref 1.000–1.030)
TOTAL PROTEIN, URINE-UPE24: NEGATIVE
UROBILINOGEN UA: 0.2 (ref 0.0–1.0)
Urine Glucose: NEGATIVE

## 2017-01-30 LAB — BASIC METABOLIC PANEL
BUN: 21 mg/dL (ref 6–23)
CALCIUM: 10 mg/dL (ref 8.4–10.5)
CO2: 30 meq/L (ref 19–32)
CREATININE: 0.78 mg/dL (ref 0.40–1.20)
Chloride: 105 mEq/L (ref 96–112)
GFR: 76.08 mL/min (ref >60.00)
Glucose, Bld: 87 mg/dL (ref 70–99)
Potassium: 4.1 mEq/L (ref 3.5–5.1)
Sodium: 141 mEq/L (ref 135–145)

## 2017-01-30 LAB — HEPATIC FUNCTION PANEL
ALT: 14 U/L (ref 0–35)
AST: 15 U/L (ref 0–37)
Albumin: 4.3 g/dL (ref 3.5–5.2)
Alkaline Phosphatase: 32 U/L — ABNORMAL LOW (ref 39–117)
BILIRUBIN TOTAL: 1.3 mg/dL — AB (ref 0.2–1.2)
Bilirubin, Direct: 0.2 mg/dL (ref 0.0–0.3)
TOTAL PROTEIN: 6.9 g/dL (ref 6.0–8.3)

## 2017-01-30 LAB — LIPID PANEL
CHOL/HDL RATIO: 3
Cholesterol: 175 mg/dL (ref 0–200)
HDL: 61 mg/dL (ref >39.00)
LDL Cholesterol: 91 mg/dL (ref 0–99)
NONHDL: 113.8
Triglycerides: 116 mg/dL (ref 0.0–149.0)
VLDL: 23.2 mg/dL (ref 0.0–40.0)

## 2017-01-30 MED ORDER — CEPHALEXIN 500 MG PO CAPS: 500.0000 mg | ORAL_CAPSULE | Freq: Three times a day (TID) | ORAL | 0 refills | Status: AC

## 2017-01-30 NOTE — Telephone Encounter (Signed)
Pt came in and stated you discussed a neurologist with her. The one she would like to go see is Madeline Mcdaniel. Thanks.

## 2017-01-30 NOTE — Assessment & Plan Note (Signed)
With steady decline, ? Geriatric delcine related vs other, for cxr, spep

## 2017-01-30 NOTE — Assessment & Plan Note (Signed)
?   More than mild cognitive impairment, has signficant emotional d/o that makes dx difficult; for dementia panel, refer neurology

## 2017-01-30 NOTE — Progress Notes (Signed)
Subjective:    Patient ID: Madeline Mcdaniel, female    DOB: September 20, 1941, 76 y.o.   MRN: [id number]  HPI  Here for yearly f/u;  Overall doing ok;  Pt denies Chest pain, worsening SOB, DOE, wheezing, orthopnea, PND, worsening LE edema, palpitations, dizziness or syncope.  Pt denies neurological change such as new headache, facial or extremity weakness.  Pt denies polydipsia, polyuria, or low sugar symptoms. Pt states overall good compliance with treatment and medications, good tolerability, and has been trying to follow appropriate diet.  Pt denies worsening depressive symptoms, suicidal ideation or panic. No fever, night sweats, loss of appetite, or other constitutional symptoms.  Pt states good ability with ADL's, has low fall risk, home safety reviewed and adequate, no other significant changes in hearing or vision, and only occasionally active with exercise. Has not gained wt, in fact has lost several lbs.  Did have one "down" day last wk with tearfulness all day but o/w stable.  Peak wt per pt was 145 a few yrs ago.     Wt Readings from Last 3 Encounters:  01/30/17 100 lb (45.4 kg)  07/31/16 107 lb (48.5 kg)  01/30/16 110 lb 12 oz (50.2 kg)  Feels her memory getting worse, even over last yr, forgetting names, has less appetite, forgets to make husbands dinner.  BP more stable off BP med last visit, dizziness resolved.no longer seems to require the questran per pt.  Past Medical History:  Diagnosis Date  . Allergic rhinitis, cause unspecified 04/27/2011  . Anemia 04/27/2011  . Asthma 04/27/2011  . Bipolar affective disorder ([place]) 04/27/2011  . Cervical spondylosis 06/01/2012  . Chronic bronchitis 04/27/2011  . Chronic cystitis 04/27/2011  . Colon polyps 04/27/2011  . Degenerative joint disease 04/27/2011  . GERD (gastroesophageal reflux disease) 04/27/2011  . HTN (hypertension) 04/27/2011  . Hyperlipidemia 04/27/2011  . Mild cognitive impairment 04/12/2013  . Osteopenia 06/01/2012  . Recurrent UTI  04/27/2011  . Right carpal tunnel syndrome 07/22/2012  . TIA (transient ischemic attack) 04/27/2011  . Urinary incontinence 04/27/2011   Past Surgical History:  Procedure Laterality Date  . BLADDER SUSPENSION     A&P REPAIR WITH MESH  . BREAST LUMPECTOMY     SEVERAL TIMES  . BUNIONECTOMY     LEFT FOOT  . CHOLECYSTECTOMY    . COLONOSCOPY  44YRS AGO   IN [place],PT DOES NOT REMEMBER DOCTOR  . Lauro Regulus Bunionectomy Left 09/09/2014   @ [facility]  . NEURECTOMY FOOT Left 09/09/2014   @ [place]  . ORTHOSCOPIC CARPETOMY     BOTH THUMBS  . POLYPECTOMY  10 YRS AGO   BENIGN POLYPS X2  . STERIOTACTIC BREAST BIOPSY     RIGHT  . TENDON TRANSFER  12/09/2012   Procedure: TENDON TRANSFER;  Surgeon: Wynonia Sours, MD;  Location: [facility];  Service: Orthopedics;  Laterality: Left;  Tenodesis PIP with FDS Slip Left ring finger, [place]  . TONSILLECTOMY    . TOTAL ABDOMINAL HYSTERECTOMY    . TRIGGER FINGER RELEASE     LEFT RING FINGER    reports that she has never smoked. She has never used smokeless tobacco. She reports that she does not drink alcohol or use drugs. family history includes Cancer (age of onset: 70) in her mother; Colon cancer in her paternal grandfather. Allergies  Allergen Reactions  . Aspirin Anaphylaxis  . Nsaids Anaphylaxis    BECAUSE OF REACTION TO VIOXX BECAUSE OF REACTION TO VIOXX  .  Vioxx [Rofecoxib] Anaphylaxis  . Ciprofloxacin Other (See Comments)    HEADACHE HEADACHE   Current Outpatient Prescriptions on File Prior to Visit  Medication Sig Dispense Refill  . acetaminophen (TYLENOL) 500 MG tablet Take 500 mg by mouth as needed.      Marland Kitchen alendronate (FOSAMAX) 70 MG tablet TAKE 1 TABLET(70 MG) BY MOUTH EVERY 7 DAYS WITH A FULL GLASS OF WATER AND ON AN EMPTY STOMACH 12 tablet 0  . CALCIUM PO Take by mouth as directed. Reported on 01/30/2016    . Coenzyme Q10 (CO Q 10 PO) Take 200 mg by mouth daily.      . Cranberry 500 MG CAPS Take by mouth 2  (two) times daily.    . Fluticasone-Salmeterol (ADVAIR DISKUS IN) Inhale 1 Inhaler into the lungs as needed. 100/50    . gabapentin (NEURONTIN) 300 MG capsule TAKE ONE CAPSULE BY MOUTH EVERY MORNING AND TAKE 2 CAPSULES EVERY EVENING 90 capsule 5  . guaiFENesin (MUCINEX) 600 MG 12 hr tablet Take 600 mg by mouth as directed.    . hydrochlorothiazide (HYDRODIURIL) 12.5 MG tablet Take 1 tablet (12.5 mg total) by mouth daily. Yearly physical w/labs due in Feb must see MD for future refills 90 tablet 0  . nitrofurantoin (MACRODANTIN) 50 MG capsule TAKE 1 CAPSULE(50 MG) BY MOUTH DAILY 90 capsule 0  . PROAIR HFA 108 (90 BASE) MCG/ACT inhaler INHALE 2 PUFFS INTO THE LUNGS EVERY 6 HOURS AS NEEDED FOR WHEEZING OR SHORTNESS OF BREATH 8.5 g 1  . ranitidine (ZANTAC 75) 75 MG tablet Take 1 tablet (75 mg total) by mouth 2 (two) times daily. 60 tablet 2  . solifenacin (VESICARE) 5 MG tablet Take 1 tablet (5 mg total) by mouth daily. 90 tablet 3   No current facility-administered medications on file prior to visit.    Review of Systems Constitutional: Negative for increased diaphoresis, or other activity, appetite or siginficant weight change other than noted HENT: Negative for worsening hearing loss, ear pain, facial swelling, mouth sores and neck stiffness.   Eyes: Negative for other worsening pain, redness or visual disturbance.  Respiratory: Negative for choking or stridor Cardiovascular: Negative for other chest pain and palpitations.  Gastrointestinal: Negative for worsening diarrhea, blood in stool, or abdominal distention Genitourinary: Negative for hematuria, flank pain or change in urine volume.  Musculoskeletal: Negative for myalgias or other joint complaints.  Skin: Negative for other color change and wound or drainage.  Neurological: Negative for syncope and numbness. other than noted Hematological: Negative for adenopathy. or other swelling Psychiatric/Behavioral: Negative for hallucinations, SI,  self-injury, decreased concentration or other worsening agitation.  All other system neg per pt    Objective:   Physical Exam BP 116/72   Pulse (!) 52   Temp 97.7 F (36.5 C)   Ht 4\' 9"  (1.448 m)   Wt 100 lb (45.4 kg)   SpO2 99%   BMI 21.64 kg/m  VS noted, has lost wt but not overaly frail, remains health appearing Constitutional: Pt is oriented to person, place, and time. Appears well-developed and well-nourished, in no significant distress Head: Normocephalic and atraumatic  Eyes: Conjunctivae and EOM are normal. Pupils are equal, round, and reactive to light Right Ear: External ear normal.  Left Ear: External ear normal Nose: Nose normal.  Mouth/Throat: Oropharynx is clear and moist  Neck: Normal range of motion. Neck supple. No JVD present. No tracheal deviation present or significant neck LA or mass Cardiovascular: Normal rate, regular rhythm, normal heart  sounds and intact distal pulses.   Pulmonary/Chest: Effort normal and breath sounds without rales or wheezing  Abdominal: Soft. Bowel sounds are normal. NT. No HSM  Musculoskeletal: Normal range of motion. Exhibits no edema Lymphadenopathy: Has no cervical adenopathy.  Neurological: Pt is alert and oriented to person, place, and time. Pt has normal reflexes. No cranial nerve deficit. Motor grossly intact Skin: Skin is warm and dry. No rash noted or new ulcers Psychiatric:  Has normal mood and affect. Behavior is normal.  No other exam findings    Assessment & Plan:

## 2017-01-30 NOTE — Patient Instructions (Addendum)
Please continue all other medications as before, and refills have been done if requested.  Please have the pharmacy call with any other refills you may need.  Please continue your efforts at being more active, low cholesterol diet, and weight control.  You are otherwise up to date with prevention measures today.  Please keep your appointments with your specialists as you may have planned  You will be contacted regarding the referral for: neurology  Please go to the XRAY Department in the Basement (go straight as you get off the elevator) for the x-ray testing  Please go to the LAB in the Basement (turn left off the elevator) for the tests to be done today  You will be contacted by phone if any changes need to be made immediately.  Otherwise, you will receive a letter about your results with an explanation, but please check with MyChart first.  Please return in 6 months, or sooner if needed

## 2017-01-30 NOTE — Assessment & Plan Note (Signed)
stable overall by history and exam, recent data reviewed with pt, and pt to continue medical treatment as before,  to f/u any worsening symptoms or concerns BP Readings from Last 3 Encounters:  01/30/17 116/72  07/31/16 116/64  01/30/16 (!) 160/90

## 2017-01-30 NOTE — Assessment & Plan Note (Signed)
stable overall by history and exam, recent data reviewed with pt, and pt to continue medical treatment as before,  to f/u any worsening symptoms or concerns Lab Results  Component Value Date   [place] 95 01/19/2016   for f/u lab, low chol diet

## 2017-02-01 NOTE — Addendum Note (Signed)
Addended by: Biagio Borg on: 02/01/2017 09:26 PM   Modules accepted: Orders

## 2017-02-01 NOTE — Telephone Encounter (Signed)
See below

## 2017-02-01 NOTE — Telephone Encounter (Signed)
Referral done

## 2017-02-25 ENCOUNTER — Other Ambulatory Visit: Payer: Self-pay | Admitting: Internal Medicine

## 2017-03-30 ENCOUNTER — Other Ambulatory Visit: Payer: Self-pay | Admitting: Internal Medicine

## 2017-04-02 ENCOUNTER — Other Ambulatory Visit: Payer: Self-pay | Admitting: Internal Medicine

## 2017-04-10 ENCOUNTER — Ambulatory Visit (INDEPENDENT_AMBULATORY_CARE_PROVIDER_SITE_OTHER): Payer: Medicare Other | Admitting: Neurology

## 2017-04-10 ENCOUNTER — Encounter: Payer: Self-pay | Admitting: Neurology

## 2017-04-10 VITALS — BP 122/58 | HR 62 | Ht <= 58 in | Wt 100.0 lb

## 2017-04-10 DIAGNOSIS — G3184 Mild cognitive impairment, so stated: Secondary | ICD-10-CM | POA: Diagnosis not present

## 2017-04-10 MED ORDER — RIVASTIGMINE TARTRATE 1.5 MG PO CAPS: 1.5000 mg | ORAL_CAPSULE | Freq: Two times a day (BID) | ORAL | 11 refills | Status: DC
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
## 2017-04-10 NOTE — Patient Instructions (Signed)
1. Schedule MRI brain without contrast 2. Bloodwork with TSH, B12 3. Start Exelon 1.5mg  twice a day 4. Control of blood pressure, cholesterol, as well as physical exercise and brain stimulation exercises are important for brain health 5. Follow-up in 6 months, call for any changes

## 2017-04-15 DIAGNOSIS — H25813 Combined forms of age-related cataract, bilateral: Secondary | ICD-10-CM | POA: Diagnosis not present

## 2017-04-16 ENCOUNTER — Other Ambulatory Visit: Payer: Self-pay

## 2017-04-16 DIAGNOSIS — G5601 Carpal tunnel syndrome, right upper limb: Secondary | ICD-10-CM

## 2017-04-16 DIAGNOSIS — R5383 Other fatigue: Secondary | ICD-10-CM

## 2017-04-19 ENCOUNTER — Ambulatory Visit
Admission: RE | Admit: 2017-04-19 | Discharge: 2017-04-19 | Disposition: A | Discharge status: Home / Self Care | Payer: Medicare Other | Source: Ambulatory Visit | Attending: Neurology | Admitting: Neurology

## 2017-04-19 ENCOUNTER — Other Ambulatory Visit: Payer: Medicare Other

## 2017-04-19 DIAGNOSIS — R5383 Other fatigue: Secondary | ICD-10-CM | POA: Diagnosis not present

## 2017-04-19 DIAGNOSIS — G3184 Mild cognitive impairment, so stated: Secondary | ICD-10-CM

## 2017-04-19 DIAGNOSIS — G5601 Carpal tunnel syndrome, right upper limb: Secondary | ICD-10-CM

## 2017-04-20 LAB — TSH: TSH: 1.06 mIU/L

## 2017-04-20 LAB — VITAMIN B12: Vitamin B-12: 383 pg/mL (ref 200–1100)

## 2017-04-23 ENCOUNTER — Telehealth: Payer: Self-pay

## 2017-04-23 NOTE — Telephone Encounter (Signed)
-----   Message from Cameron Sprang, MD sent at 04/23/2017 11:26 AM EDT ----- Pls let him know thyroid and B12 levels are normal. Thanks

## 2017-04-23 NOTE — Telephone Encounter (Signed)
Spoke with pt and relayed message below.

## 2017-05-02 ENCOUNTER — Other Ambulatory Visit: Payer: Self-pay | Admitting: Internal Medicine

## 2017-05-03 ENCOUNTER — Telehealth: Payer: Self-pay

## 2017-05-03 NOTE — Telephone Encounter (Signed)
-----   Message from Cameron Sprang, MD sent at 05/03/2017  6:11 AM EDT ----- Pls let her know MRI brain did not show any evidence of tumor, stroke, or bleed. It shows age-related changes. Thanks

## 2017-05-03 NOTE — Telephone Encounter (Signed)
Called pt and relayed message below.  Pt appreciative.

## 2017-07-18 ENCOUNTER — Other Ambulatory Visit: Payer: Self-pay | Admitting: Internal Medicine

## 2017-07-18 ENCOUNTER — Telehealth: Payer: Self-pay | Admitting: Neurology

## 2017-07-18 DIAGNOSIS — Z1231 Encounter for screening mammogram for malignant neoplasm of breast: Secondary | ICD-10-CM

## 2017-07-18 NOTE — Telephone Encounter (Signed)
Patient lmom regarding a message that was left on her My Chart. She had a question. Please call. Thanks

## 2017-07-18 NOTE — Telephone Encounter (Signed)
Pt has been out of town with sick family member, rcvd message in my chart that she had cancelled an appt and did not r/s.Marland Kitchen Pt states she had an appt, had to r/s and was given 11/13 @ 2:45...advsd her Dr Delice Lesch will be out of office, asked her to hold while I talked with reception about available dates, she hung up before I could get back to the call. Appt made for 10/22/17 @ 11:00a-called Pt back to inform her of appt, had to leave message on VM.

## 2017-07-18 NOTE — Telephone Encounter (Signed)
PT left a voicemail message saying she had a question about a message she received in [place]

## 2017-07-31 ENCOUNTER — Encounter: Payer: Self-pay | Admitting: Internal Medicine

## 2017-07-31 ENCOUNTER — Ambulatory Visit (INDEPENDENT_AMBULATORY_CARE_PROVIDER_SITE_OTHER): Payer: Medicare Other | Admitting: Internal Medicine

## 2017-07-31 VITALS — BP 106/70 | HR 60 | Temp 97.7°F | Ht <= 58 in | Wt 96.0 lb

## 2017-07-31 DIAGNOSIS — I10 Essential (primary) hypertension: Secondary | ICD-10-CM

## 2017-07-31 DIAGNOSIS — R634 Abnormal weight loss: Secondary | ICD-10-CM | POA: Diagnosis not present

## 2017-07-31 DIAGNOSIS — E785 Hyperlipidemia, unspecified: Secondary | ICD-10-CM | POA: Diagnosis not present

## 2017-07-31 DIAGNOSIS — Z23 Encounter for immunization: Secondary | ICD-10-CM | POA: Diagnosis not present

## 2017-07-31 MED ORDER — NITROFURANTOIN MACROCRYSTAL 50 MG PO CAPS
ORAL_CAPSULE | ORAL | 3 refills | Status: DC
Start: 2017-07-31 — End: 2018-08-06

## 2017-07-31 NOTE — Progress Notes (Signed)
Subjective:    Patient ID: Madeline Mcdaniel, female    DOB: 07-Nov-1941, 76 y.o.   MRN: [id number]  HPI  Here to f/u; overall doing ok,  Pt denies chest pain, increasing sob or doe, wheezing, orthopnea, PND, increased LE swelling, palpitations, dizziness or syncope.  Pt denies new neurological symptoms such as new headache, or facial or extremity weakness or numbness.  Pt denies polydipsia, polyuria, or low sugar episode.  Pt states overall good compliance with meds, mostly trying to follow appropriate diet, but Has had mild persistent wt loss over the past yr.  Due for fu shot  No other complaints Wt Readings from Last 3 Encounters:  07/31/17 96 lb (43.5 kg)  04/10/17 100 lb (45.4 kg)  01/30/17 100 lb (45.4 kg)  overall stamina not getting worse, in fact [place] 4 [place] wks ago. Past Medical History:  Diagnosis Date  . Allergic rhinitis, cause unspecified 04/27/2011  . Anemia 04/27/2011  . Asthma 04/27/2011  . Bipolar affective disorder ([place]) 04/27/2011  . Cervical spondylosis 06/01/2012  . Chronic bronchitis 04/27/2011  . Chronic cystitis 04/27/2011  . Colon polyps 04/27/2011  . Degenerative joint disease 04/27/2011  . GERD (gastroesophageal reflux disease) 04/27/2011  . HTN (hypertension) 04/27/2011  . Hyperlipidemia 04/27/2011  . Mild cognitive impairment 04/12/2013  . Osteopenia 06/01/2012  . 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 Right carpal tunnel syndrome 07/22/2012  . TIA (transient ischemic attack) 04/27/2011  . Urinary incontinence 04/27/2011   Past Surgical History:  Procedure Laterality Date  . BLADDER SUSPENSION     A&P REPAIR WITH MESH  . BREAST LUMPECTOMY     SEVERAL TIMES  . BUNIONECTOMY     LEFT FOOT  . CHOLECYSTECTOMY    . COLONOSCOPY  4YRS AGO   IN [place],PT DOES NOT REMEMBER DOCTOR  . Lauro Regulus Bunionectomy Left 09/09/2014   @ [facility]  . NEURECTOMY FOOT Left 09/09/2014   @ [facility]  . ORTHOSCOPIC CARPETOMY     BOTH THUMBS  .  POLYPECTOMY  10 YRS AGO   BENIGN POLYPS X2  . STERIOTACTIC BREAST BIOPSY     RIGHT  . TENDON TRANSFER  12/09/2012   Procedure: TENDON TRANSFER;  Surgeon: Wynonia Sours, MD;  Location: [facility];  Service: Orthopedics;  Laterality: Left;  [place] PIP with FDS Slip Left ring finger, [place]  . TONSILLECTOMY    . TOTAL ABDOMINAL HYSTERECTOMY    . TRIGGER FINGER RELEASE     LEFT RING FINGER    reports that she has never smoked. She has never used smokeless tobacco. She reports that she does not drink alcohol or use drugs. family history includes Cancer (age of onset: 92) in her mother; Colon cancer in her paternal grandfather. Allergies  Allergen Reactions  . Aspirin Anaphylaxis  . Nsaids Anaphylaxis    BECAUSE OF REACTION TO VIOXX BECAUSE OF REACTION TO VIOXX  . Vioxx [Rofecoxib] Anaphylaxis  . Ciprofloxacin Other (See Comments)    HEADACHE HEADACHE   Current Outpatient Prescriptions on File Prior to Visit  Medication Sig Dispense Refill  . acetaminophen (TYLENOL) 500 MG tablet Take 500 mg by mouth as needed.      Marland Kitchen alendronate (FOSAMAX) 70 MG tablet TAKE 1 TABLET(70 MG) BY MOUTH EVERY 7 DAYS WITH A FULL GLASS OF WATER AND ON AN EMPTY STOMACH 12 tablet 1  . CALCIUM PO Take by mouth as directed. Reported on 01/30/2016    . Coenzyme Q10 (  CO Q 10 PO) Take 200 mg by mouth daily.      . Cranberry 500 MG CAPS Take by mouth 2 (two) times daily.    . Fluticasone-Salmeterol (ADVAIR DISKUS IN) Inhale 1 Inhaler into the lungs as needed. 100/50    . gabapentin (NEURONTIN) 300 MG capsule TAKE ONE CAPSULE BY MOUTH EVERY MORNING AND TAKE 2 CAPSULES EVERY EVENING 90 capsule 5  . guaiFENesin (MUCINEX) 600 MG 12 hr tablet Take 600 mg by mouth as directed.    . hydrochlorothiazide (HYDRODIURIL) 12.5 MG tablet TAKE 1 TABLET BY MOUTH DAILY 90 tablet 1  . PROAIR HFA 108 (90 BASE) MCG/ACT inhaler INHALE 2 PUFFS INTO THE LUNGS EVERY 6 HOURS AS NEEDED FOR WHEEZING OR SHORTNESS OF BREATH 8.5 g  1  . ranitidine (ZANTAC 75) 75 MG tablet Take 1 tablet (75 mg total) by mouth 2 (two) times daily. 60 tablet 2  . rivastigmine (EXELON) 1.5 MG capsule Take 1 capsule (1.5 mg total) by mouth 2 (two) times daily. 60 capsule 11  . VESICARE 5 MG tablet TAKE 1 TABLET(5 MG) BY MOUTH DAILY 90 tablet 1   No current facility-administered medications on file prior to visit.    Review of Systems  Constitutional: Negative for other unusual diaphoresis or sweats HENT: Negative for ear discharge or swelling Eyes: Negative for other worsening visual disturbances Respiratory: Negative for stridor or other swelling  Gastrointestinal: Negative for worsening distension or other blood Genitourinary: Negative for retention or other urinary change Musculoskeletal: Negative for other MSK pain or swelling Skin: Negative for color change or other new lesions Neurological: Negative for worsening tremors and other numbness  Psychiatric/Behavioral: Negative for worsening agitation or other fatigue All other system neg per pt    Objective:   Physical Exam BP 106/70   Pulse 60   Temp 97.7 F (36.5 C) (Oral)   Ht 4\' 10"  (1.473 m)   Wt 96 lb (43.5 kg)   SpO2 99%   BMI 20.06 kg/m  VS noted,  Constitutional: Pt appears in NAD HENT: Head: NCAT.  Right Ear: External ear normal.  Left Ear: External ear normal.  Eyes: . Pupils are equal, round, and reactive to light. Conjunctivae and EOM are normal Nose: without d/c or deformity Neck: Neck supple. Gross normal ROM Cardiovascular: Normal rate and regular rhythm.   Pulmonary/Chest: Effort normal and breath sounds without rales or wheezing.  Abd:  Soft, NT, ND, + BS, no organomegaly Neurological: Pt is alert. At baseline orientation, motor grossly intact Skin: Skin is warm. No rashes, other new lesions, no LE edema Psychiatric: Pt behavior is normal without agitation  No other exam findings    Assessment & Plan:

## 2017-07-31 NOTE — Patient Instructions (Addendum)
You had the flu shot today  Please continue all other medications as before, and refills have been done if requested.  Please have the pharmacy call with any other refills you may need.  Please continue your efforts at being more active, low cholesterol diet, and weight control.  Please keep your appointments with your specialists as you may have planned  Please return in 6 months, or sooner if needed 

## 2017-08-01 ENCOUNTER — Telehealth: Payer: Self-pay | Admitting: Internal Medicine

## 2017-08-01 MED ORDER — ZOSTER VAC RECOMB ADJUVANTED 50 MCG/0.5ML IM SUSR: 0.5000 mL | Freq: Once | INTRAMUSCULAR | 1 refills | Status: AC

## 2017-08-01 NOTE — Telephone Encounter (Signed)
Pt.notified

## 2017-08-01 NOTE — Telephone Encounter (Signed)
This was sent erx

## 2017-08-01 NOTE — Telephone Encounter (Signed)
Pt called stating she forgot to mention to John yesterday that she would like to receive shingrix Please send pharmacy on file

## 2017-08-03 NOTE — Assessment & Plan Note (Signed)
Has lost several more lbs but o/w exam benign, I suspect due to geriatric decline, encourage po intakem, to f/u any worsening symptoms or concerns

## 2017-08-03 NOTE — Assessment & Plan Note (Signed)
stable overall by history and exam, recent data reviewed with pt, and pt to continue medical treatment as before,  to f/u any worsening symptoms or concerns BP Readings from Last 3 Encounters:  07/31/17 106/70  04/10/17 (!) 122/58  01/30/17 116/72

## 2017-08-03 NOTE — Assessment & Plan Note (Signed)
Lab Results  Component Value Date   LDLCALC 91 01/30/2017  stable overall by history and exam, recent data reviewed with pt, and pt to continue medical treatment as before,  to f/u any worsening symptoms or concerns

## 2017-08-26 ENCOUNTER — Ambulatory Visit
Admission: RE | Admit: 2017-08-26 | Discharge: 2017-08-26 | Disposition: A | Discharge status: Home / Self Care | Payer: Medicare Other | Source: Ambulatory Visit | Attending: Internal Medicine | Admitting: Internal Medicine

## 2017-08-26 DIAGNOSIS — Z1231 Encounter for screening mammogram for malignant neoplasm of breast: Secondary | ICD-10-CM

## 2017-09-09 ENCOUNTER — Other Ambulatory Visit: Payer: Self-pay | Admitting: Internal Medicine

## 2017-10-03 ENCOUNTER — Other Ambulatory Visit: Payer: Self-pay | Admitting: Internal Medicine

## 2017-10-15 ENCOUNTER — Ambulatory Visit: Payer: Medicare Other | Admitting: Neurology

## 2017-10-16 DIAGNOSIS — H25813 Combined forms of age-related cataract, bilateral: Secondary | ICD-10-CM | POA: Diagnosis not present

## 2017-10-22 ENCOUNTER — Encounter: Payer: Self-pay | Admitting: Neurology

## 2017-10-22 ENCOUNTER — Other Ambulatory Visit: Payer: Self-pay

## 2017-10-22 ENCOUNTER — Telehealth: Payer: Self-pay | Admitting: Neurology

## 2017-10-22 ENCOUNTER — Ambulatory Visit (INDEPENDENT_AMBULATORY_CARE_PROVIDER_SITE_OTHER): Payer: Medicare Other | Admitting: Neurology

## 2017-10-22 DIAGNOSIS — R2681 Unsteadiness on feet: Secondary | ICD-10-CM

## 2017-10-22 DIAGNOSIS — G3184 Mild cognitive impairment, so stated: Secondary | ICD-10-CM

## 2017-10-22 MED ORDER — RIVASTIGMINE TARTRATE 3 MG PO CAPS: 3.0000 mg | ORAL_CAPSULE | Freq: Two times a day (BID) | ORAL | 3 refills | Status: DC

## 2017-10-22 NOTE — Telephone Encounter (Signed)
Physical therapy place in [place] is called [facility]

## 2017-10-22 NOTE — Patient Instructions (Addendum)
1. Increase Exelon to 3mg  twice a day: With your current bottle of Exelon 1.5mg , take 2 caps twice a day. Once done with this bottle, your new prescription will be for Exelon 3mg , take 1 cap twice a day 2. Schedule Neurocognitive testing with Dr. Si Raider 3. Refer to PT for Balance therapy 4. Follow-up after testing  You have been referred for a neurocognitive evaluation in our office.   The evaluation takes approximately two hours. The first part of the appointment is a clinical interview with the neuropsychologist (Dr. Macarthur Critchley). Please bring someone with you to this appointment if possible, as it is helpful for Dr. Si Raider to hear from both you and another adult who knows you well. After speaking with Dr. Si Raider, you will complete testing with her technician. The testing includes a variety of tasks- mostly question-and-answer, some paper-and-pencil. There is nothing you need to do to prepare for this appointment, but having a good night's sleep prior to the testing, and bringing eyeglasses and hearing aids (if you wear them), is advised.   About a week after the evaluation, you will return to follow up with Dr. Si Raider to review the test results. This appointment is about 30 minutes. If you would like a family member to receive this information as well, please bring them to the appointment.   We have to reserve several hours of the neuropsychologist's time and the psychometrician's time for your evaluation appointment. As such, please note that there is a No-Show fee of $100. If you are unable to attend any of your appointments, please contact our office as soon as possible to reschedule.   RECOMMENDATIONS FOR ALL PATIENTS WITH MEMORY PROBLEMS: 1. Continue to exercise (Recommend 30 minutes of walking everyday, or 3 hours every week) 2. Increase social interactions - continue going to [place] and enjoy social gatherings with friends and family 3. Eat healthy, avoid fried foods and eat more  fruits and vegetables 4. Maintain adequate blood pressure, blood sugar, and blood cholesterol level. Reducing the risk of stroke and cardiovascular disease also helps promoting better memory. 5. Avoid stressful situations. Live a simple life and avoid aggravations. Organize your time and prepare for the next day in anticipation. 6. Sleep well, avoid any interruptions of sleep and avoid any distractions in the bedroom that may interfere with adequate sleep quality 7. Avoid sugar, avoid sweets as there is a strong link between excessive sugar intake, diabetes, and cognitive impairment The Mediterranean diet has been shown to help patients reduce the risk of progressive memory disorders and reduces cardiovascular risk. This includes eating fish, eat fruits and green leafy vegetables, nuts like almonds and hazelnuts, walnuts, and also use olive oil. Avoid fast foods and fried foods as much as possible. Avoid sweets and sugar as sugar use has been linked to worsening of memory function.
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
## 2017-10-28 DIAGNOSIS — N3941 Urge incontinence: Secondary | ICD-10-CM | POA: Diagnosis not present

## 2017-10-28 DIAGNOSIS — Z87440 Personal history of urinary (tract) infections: Secondary | ICD-10-CM | POA: Diagnosis not present

## 2017-10-28 DIAGNOSIS — N3090 Cystitis, unspecified without hematuria: Secondary | ICD-10-CM | POA: Diagnosis not present

## 2017-10-30 NOTE — Telephone Encounter (Signed)
Patient called and said that the medication that was just doubled for her she cannot take it. She has been throwing up and nauseated. That is the only change she has made lately. She seemed to do ok on the lower dose. She said the pharmacy called and said that the doubled prescription is ready for pick up. Please call. Thanks

## 2017-10-31 ENCOUNTER — Telehealth: Payer: Self-pay | Admitting: Neurology

## 2017-10-31 ENCOUNTER — Other Ambulatory Visit: Payer: Self-pay

## 2017-10-31 DIAGNOSIS — G3184 Mild cognitive impairment, so stated: Secondary | ICD-10-CM

## 2017-10-31 MED ORDER — RIVASTIGMINE TARTRATE 1.5 MG PO CAPS: 1.5000 mg | ORAL_CAPSULE | Freq: Two times a day (BID) | ORAL | 3 refills | Status: DC

## 2017-10-31 NOTE — Telephone Encounter (Signed)
Pt called and said she is having a hard time with her medication and wants to know if the dosage can be decreased, think she said it was the Exelon

## 2017-10-31 NOTE — Telephone Encounter (Signed)
Spoke with pt.  She states that her Exelon was increased on November 20, from 1.5mg  to 3mg .  She has been having a hard time with the 3mg .  States that she is jittery, nervous, and nauseous.  Has not gotten sick, but has felt as though she would get get sick.  Pt asked about lowering back to Exelon 1.5mg .  I let pt know that Dr Delice Lesch is out of the office until next Wednesday, but that I would send Rx for lower dose and to stay on lower dose until further notice from Dr. Delice Lesch.   Rx sent to pt's verified preferred pharmacy.

## 2017-11-21 ENCOUNTER — Other Ambulatory Visit: Payer: Self-pay | Admitting: Internal Medicine

## 2017-11-30 ENCOUNTER — Other Ambulatory Visit: Payer: Self-pay | Admitting: Internal Medicine

## 2017-12-05 DIAGNOSIS — H25811 Combined forms of age-related cataract, right eye: Secondary | ICD-10-CM | POA: Diagnosis not present

## 2017-12-11 DIAGNOSIS — H269 Unspecified cataract: Secondary | ICD-10-CM | POA: Diagnosis not present

## 2017-12-11 DIAGNOSIS — H25811 Combined forms of age-related cataract, right eye: Secondary | ICD-10-CM | POA: Diagnosis not present

## 2017-12-16 ENCOUNTER — Other Ambulatory Visit: Payer: Self-pay

## 2017-12-16 ENCOUNTER — Ambulatory Visit: Payer: Medicare Other | Attending: Neurology | Admitting: Physical Therapy

## 2017-12-16 DIAGNOSIS — R262 Difficulty in walking, not elsewhere classified: Secondary | ICD-10-CM | POA: Diagnosis not present

## 2017-12-16 DIAGNOSIS — M6281 Muscle weakness (generalized): Secondary | ICD-10-CM | POA: Insufficient documentation

## 2017-12-16 DIAGNOSIS — R2681 Unsteadiness on feet: Secondary | ICD-10-CM | POA: Diagnosis not present

## 2017-12-17 NOTE — Therapy (Signed)
[facility] [address]  [place], [place], [address] Phone: [phone]   Fax:  [phone]  Physical Therapy Evaluation  Patient Details  Name: Madeline Mcdaniel MRN: [id number] Date of Birth: [birth year] Referring Provider: Ellouise Newer, MD   Encounter Date: 12/16/2017  PT End of Session - 12/16/17 1355    Visit Number  1    Number of Visits  8    Date for PT Re-Evaluation  01/17/18    Authorization Type  Medicare & Dory Horn Plan G    PT Start Time  0539    PT Stop Time  1442    PT Time Calculation (min)  47 min    Activity Tolerance  Patient tolerated treatment well    Behavior During Therapy  [facility] for tasks assessed/performed       Past Medical History:  Diagnosis Date  . Allergic rhinitis, cause unspecified 04/27/2011  . Anemia 04/27/2011  . Asthma 04/27/2011  . Bipolar affective disorder ([place]) 04/27/2011  . Cervical spondylosis 06/01/2012  . Chronic bronchitis 04/27/2011  . Chronic cystitis 04/27/2011  . Colon polyps 04/27/2011  . Degenerative joint disease 04/27/2011  . GERD (gastroesophageal reflux disease) 04/27/2011  . HTN (hypertension) 04/27/2011  . Hyperlipidemia 04/27/2011  . Mild cognitive impairment 04/12/2013  . Osteopenia 06/01/2012  . 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 Right carpal tunnel syndrome 07/22/2012  . TIA (transient ischemic attack) 04/27/2011  . Urinary incontinence 04/27/2011    Past Surgical History:  Procedure Laterality Date  . BLADDER SUSPENSION     A&P REPAIR WITH MESH  . BREAST LUMPECTOMY     SEVERAL TIMES  . BUNIONECTOMY     LEFT FOOT  . CHOLECYSTECTOMY    . COLONOSCOPY  60YRS AGO   IN [place],PT DOES NOT REMEMBER DOCTOR  . Lauro Regulus Bunionectomy Left 09/09/2014   @ [facility]  . NEURECTOMY FOOT Left 09/09/2014   @ [place]  . ORTHOSCOPIC CARPETOMY     BOTH THUMBS  . POLYPECTOMY  10 YRS AGO   BENIGN POLYPS X2  . STERIOTACTIC BREAST BIOPSY     RIGHT  . TENDON TRANSFER   12/09/2012   Procedure: TENDON TRANSFER;  Surgeon: Wynonia Sours, MD;  Location: [place];  Service: Orthopedics;  Laterality: Left;  Tenodesis PIP with FDS Slip Left ring finger, [place]ug[place]not  . TONSILLECTOMY    . TOTAL ABDOMINAL HYSTERECTOMY    . TRIGGER FINGER RELEASE     LEFT RING FINGER    There were no vitals filed for this visit.   Subjective Assessment - 12/16/17 1359    Subjective  Pt reporting worsening unsteadiness over the past year, tripping more often but not falling yet. Tripping typically from catching toes on floors. Notes occaisional dizziness/lightheadedness, but denies any association to LOB. Would like to avoid falling.    Limitations  Walking;House hold activities    Patient Stated Goals  Would like to avoid falling.    Currently in Pain?  No/denies         [facility] PT Assessment - 12/16/17 1355      Assessment   Medical Diagnosis  Gait instability    Referring Provider  Ellouise Newer, MD    Onset Date/Surgical Date  -- ~1 yr    Next MD Visit  04/22/18    Prior Therapy  none for current condition      Balance Screen   Has the patient fallen in the past 6 months  No    Has the patient had a decrease in activity level because of a fear of falling?   Yes    Is the patient reluctant to leave their home because of a fear of falling?   No      Home Social worker  Private residence    Living Arrangements  Spouse/significant other    Available Help at Discharge  Family    Type of [place] to enter    Entrance Stairs-Number of Steps  1    [place]  Two level;Able to live on main level with bedroom/bathroom    Home Equipment  None      Prior Function   Level of Independence  Independent    Vocation  Retired    [employer]  retired Therapist, sports    Leisure  walk 2 miles/day - 3x/wk, leg press & social activities at [employer] in [employer], reading      Coordination   Gross Motor Movements are Fluid and  Coordinated  Yes      ROM / Strength   AROM / PROM / Strength  Strength      Strength   Strength Assessment Site  Hip;Knee;Ankle    Right/Left Hip  Right;Left    Right Hip Flexion  4-/5    Right Hip Extension  4-/5    Right Hip External Rotation   4-/5    Right Hip Internal Rotation  4/5    Right Hip ABduction  4/5    Right Hip ADduction  4-/5    Left Hip Flexion  4-/5    Left Hip Extension  4-/5    Left Hip External Rotation  4-/5    Left Hip Internal Rotation  4/5    Left Hip ABduction  4-/5    Left Hip ADduction  4-/5    Right/Left Knee  Right;Left    Right Knee Flexion  4-/5    Right Knee Extension  4/5    Left Knee Flexion  4-/5    Left Knee Extension  4/5    Right/Left Ankle  Right;Left    Right Ankle Dorsiflexion  4/5    Right Ankle Plantar Flexion  4+/5    Left Ankle Dorsiflexion  4/5    Left Ankle Plantar Flexion  4+/5      Flexibility   Soft Tissue Assessment /Muscle Length  yes      Ambulation/Gait   Gait velocity  3.75 ft/sec      Standardized Balance Assessment   Standardized Balance Assessment  Berg Balance Test;Timed Up and Go Test;10 meter walk test    10 Meter Walk  8.75      Berg Balance Test   Sit to Stand  Able to stand without using hands and stabilize independently    Standing Unsupported  Able to stand safely 2 minutes    Sitting with Back Unsupported but Feet Supported on Floor or Stool  Able to sit safely and securely 2 minutes    Stand to Sit  Sits safely with minimal use of hands    Transfers  Able to transfer safely, minor use of hands    Standing Unsupported with Eyes Closed  Able to stand 10 seconds with supervision    Standing Ubsupported with Feet Together  Able to place feet together independently and stand 1 minute safely    From Standing, Reach Forward with Outstretched Arm  Can reach  confidently >25 cm (10")    From Standing Position, Pick up Object from [place] to pick up shoe safely and easily    From Standing Position, Turn  to Look Behind Over each Shoulder  Looks behind from both sides and weight shifts well    Turn 360 Degrees  Able to turn 360 degrees safely in 4 seconds or less    Standing Unsupported, Alternately Place Feet on Step/Stool  Able to stand independently and safely and complete 8 steps in 20 seconds    Standing Unsupported, One Foot in [place] to plae foot ahead of the other independently and hold 30 seconds    Standing on One Leg  Able to lift leg independently and hold 5-10 seconds    Total Score  53      Timed Up and Go Test   TUG  Normal TUG;Manual TUG;Cognitive TUG    Normal TUG (seconds)  10.31    Manual TUG (seconds)  9.5    Cognitive TUG (seconds)  14.93      Functional Gait  Assessment   Gait assessed   Yes    Gait Level Surface  Walks 20 ft in less than 5.5 sec, no assistive devices, good speed, no evidence for imbalance, normal gait pattern, deviates no more than 6 in outside of the 12 in walkway width.    Change in Gait Speed  Able to smoothly change walking speed without loss of balance or gait deviation. Deviate no more than 6 in outside of the 12 in walkway width.    Gait with Horizontal Head Turns  Performs head turns smoothly with no change in gait. Deviates no more than 6 in outside 12 in walkway width    Gait with Vertical Head Turns  Performs task with slight change in gait velocity (eg, minor disruption to smooth gait path), deviates 6 - 10 in outside 12 in walkway width or uses assistive device    Gait and Pivot Turn  Pivot turns safely within 3 sec and stops quickly with no loss of balance.    Step Over Obstacle  Is able to step over 2 stacked shoe boxes taped together (9 in total height) without changing gait speed. No evidence of imbalance.    Gait with Narrow Base of Support  Ambulates 4-7 steps.    Gait with Eyes Closed  Walks 20 ft, slow speed, abnormal gait pattern, evidence for imbalance, deviates 10-15 in outside 12 in walkway width. Requires more than 9 sec to  ambulate 20 ft.    Ambulating Backwards  Walks 20 ft, slow speed, abnormal gait pattern, evidence for imbalance, deviates 10-15 in outside 12 in walkway width.    Steps  Alternating feet, must use rail.    Total Score  22    FGA comment:  19-24 = medium risk fall             Objective measurements completed on examination: See above findings.                   PT Long Term Goals - 12/16/17 1442      PT LONG TERM GOAL #1   Title  Independent with ongoing strengthening and balance HEP    Status  New    Target Date  01/17/18      PT LONG TERM GOAL #2   Title  B LE strength grossly 4/5 to 4+/5 or greater for improved stability    Status  New    Target Date  01/17/18      PT LONG TERM GOAL #3   Title  Improve FGA to >/= 26/30 to decrease risk for falls    Status  New    Target Date  01/17/18      PT LONG TERM GOAL #4   Title  Pt will verbalize understanding of fall prevention tips and strategies to reduce risk for falls    Status  New    Target Date  01/17/18             Plan - 12/16/17 1442    Clinical Impression Statement  Parthenia is a 59 t/o female who presents to OP PT for gait instability. Pt reporting increasing unsteadiness with dynamic activities and fear of falling worsening over the past year, but denies any falls. Pt demonstrates mild LE weakness and moderate risk for falls per the FGA score of 22/30. Pt will benefit from skilled PT to address deficits listed to improve gait stability and decrease risk for falls.    History and Personal Factors relevant to plan of care:  TIA, HTN, DJD, memory loss, PMH as above    Clinical Presentation  Stable    Clinical Decision Making  Low    Rehab Potential  Good    Clinical Impairments Affecting Rehab Potential  memory loss    PT Frequency  2x / week    PT Duration  4 weeks    PT Treatment/Interventions  Patient/family education;Neuromuscular re-education;Balance training;Therapeutic exercise;Therapeutic  activities;Functional mobility training;Gait training;Stair training;ADLs/Self Care Home Management    Consulted and Agree with Plan of Care  Patient       Patient will benefit from skilled therapeutic intervention in order to improve the following deficits and impairments:  Decreased balance, Decreased safety awareness, Difficulty walking, Decreased strength, Decreased activity tolerance  Visit Diagnosis: Unsteadiness on feet  Muscle weakness (generalized)  Difficulty in walking, not elsewhere classified     Problem List Patient Active Problem List   Diagnosis Date Noted  . Memory dysfunction 01/30/2017  . Urinary frequency 01/19/2016  . Loss of weight 04/19/2015  . Dyspnea 04/06/2015  . Edema 12/28/2014  . Onychomycosis 08/31/2014  . Pain in lower limb 08/31/2014  . 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  . Carpal tunnel syndrome, right 08/25/2014  . Bilateral bunions 08/25/2014  . Left foot pain 08/25/2013  . Mild cognitive impairment 04/12/2013  . Fatigue 04/08/2013  . Right carpal tunnel syndrome 07/22/2012  . Brachial neuritis or radiculitis 07/01/2012  . Trigger finger, left 07/01/2012  . Osteopenia 06/01/2012  . Cervical spondylosis 06/01/2012  . Mild intermittent asthma 04/27/2011  . Degenerative joint disease 04/27/2011  . Bipolar affective disorder ([place]) 04/27/2011  . Chronic bronchitis 04/27/2011  . GERD (gastroesophageal reflux disease) 04/27/2011  . Allergic rhinitis, cause unspecified 04/27/2011  . HTN (hypertension) 04/27/2011  . Chronic cystitis 04/27/2011  . Colon polyps 04/27/2011  . Anemia 04/27/2011  . H/O: hysterectomy 04/27/2011  . TIA (transient ischemic attack) 04/27/2011  . Urinary incontinence 04/27/2011  . 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 Hyperlipidemia 04/27/2011  . Preventative health care 04/27/2011    Percival Spanish, PT, MPT 12/17/2017, 8:42 AM  [facility] [address]   [place], [place], [address] Phone: [phone]   Fax:  [phone]  Name: Madeline Mcdaniel MRN: [id number] Date of Birth: [birth year]

## 2017-12-18 ENCOUNTER — Ambulatory Visit: Payer: Medicare Other | Admitting: Physical Therapy

## 2017-12-18 ENCOUNTER — Other Ambulatory Visit: Payer: Self-pay | Admitting: Internal Medicine

## 2017-12-18 ENCOUNTER — Encounter: Payer: Self-pay | Admitting: Physical Therapy

## 2017-12-18 DIAGNOSIS — R262 Difficulty in walking, not elsewhere classified: Secondary | ICD-10-CM | POA: Diagnosis not present

## 2017-12-18 DIAGNOSIS — M6281 Muscle weakness (generalized): Secondary | ICD-10-CM

## 2017-12-18 DIAGNOSIS — R2681 Unsteadiness on feet: Secondary | ICD-10-CM | POA: Diagnosis not present

## 2017-12-18 NOTE — Therapy (Signed)
[facility] [address]  [place], [place], [address] Phone: [phone]   Fax:  [phone]  Physical Therapy Treatment  Patient Details  Name: Madeline Mcdaniel MRN: [id number] Date of Birth: 1941-07-26 Referring Provider: Ellouise Newer, MD   Encounter Date: 12/18/2017  PT End of Session - 12/18/17 1015    Visit Number  2    Number of Visits  8    Date for PT Re-Evaluation  01/17/18    Authorization Type  Medicare & Dory Horn Plan G    PT Start Time  7124    PT Stop Time  1102    PT Time Calculation (min)  47 min    Activity Tolerance  Patient tolerated treatment well    Behavior During Therapy  [facility] for tasks assessed/performed       Past Medical History:  Diagnosis Date  . Allergic rhinitis, cause unspecified 04/27/2011  . Anemia 04/27/2011  . Asthma 04/27/2011  . Bipolar affective disorder ([place]) 04/27/2011  . Cervical spondylosis 06/01/2012  . Chronic bronchitis 04/27/2011  . Chronic cystitis 04/27/2011  . Colon polyps 04/27/2011  . Degenerative joint disease 04/27/2011  . GERD (gastroesophageal reflux disease) 04/27/2011  . HTN (hypertension) 04/27/2011  . Hyperlipidemia 04/27/2011  . Mild cognitive impairment 04/12/2013  . Osteopenia 06/01/2012  . 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 Right carpal tunnel syndrome 07/22/2012  . TIA (transient ischemic attack) 04/27/2011  . Urinary incontinence 04/27/2011    Past Surgical History:  Procedure Laterality Date  . BLADDER SUSPENSION     A&P REPAIR WITH MESH  . BREAST LUMPECTOMY     SEVERAL TIMES  . BUNIONECTOMY     LEFT FOOT  . CHOLECYSTECTOMY    . COLONOSCOPY  32YRS AGO   IN [place],PT DOES NOT REMEMBER DOCTOR  . Lauro Regulus Bunionectomy Left 09/09/2014   @ [facility]  . NEURECTOMY FOOT Left 09/09/2014   @ [place]  . ORTHOSCOPIC CARPETOMY     BOTH THUMBS  . POLYPECTOMY  10 YRS AGO   BENIGN POLYPS X2  . STERIOTACTIC BREAST BIOPSY     RIGHT  . TENDON TRANSFER   12/09/2012   Procedure: TENDON TRANSFER;  Surgeon: Wynonia Sours, MD;  Location: [place];  Service: Orthopedics;  Laterality: Left;  Tenodesis PIP with FDS Slip Left ring finger, [place]  . TONSILLECTOMY    . TOTAL ABDOMINAL HYSTERECTOMY    . TRIGGER FINGER RELEASE     LEFT RING FINGER    There were no vitals filed for this visit.  Subjective Assessment - 12/18/17 1018    Subjective  Pt doing well today.    Patient Stated Goals  Would like to avoid falling.    Currently in Pain?  No/denies                      [facility] Adult PT Treatment/Exercise - 12/18/17 1015      Knee/Hip Exercises: Aerobic   Recumbent Bike  L1 x 6'          Balance Exercises - 12/18/17 1015      OTAGO PROGRAM   Head Movements  Standing;5 reps    Neck Movements  Standing;5 reps    Back Extension  Standing;5 reps    Trunk Movements  Standing;5 reps    Ankle Movements  Sitting;10 reps    Knee Extensor  20 reps    Knee Flexor  10 reps  Hip ABductor  10 reps    Ankle Plantorflexors  20 reps, support    Ankle Dorsiflexors  20 reps, support    Knee Bends  10 reps, support    Backwards Walking  Support    Walking and Turning Around  No assistive device cones for targets with turning    Sideways Walking  No assistive device    Tandem Stance  10 seconds, support    Tandem Walk  Support    One Leg Stand  10 seconds, support    Heel Walking  Support    Toe Walk  Support    Heel Toe Walking Backward  -- Level C=Support    Sit to Stand  10 reps, no support    Stair Walking  -- deferred today        PT Education - 12/18/17 1102    Education provided  Yes    Education Details  Initial HEP - [employer] Fall Prevent Program    Person(s) Educated  Patient    Methods  Explanation;Demonstration;Verbal cues;Handout    Comprehension  Verbalized understanding;Returned demonstration;Verbal cues required;Need further instruction          PT Long Term Goals - 12/18/17 1019       PT LONG TERM GOAL #1   Title  Independent with ongoing strengthening and balance HEP    Status  On-going      PT LONG TERM GOAL #2   Title  B LE strength grossly 4/5 to 4+/5 or greater for improved stability    Status  On-going      PT LONG TERM GOAL #3   Title  Improve FGA to >/= 26/30 to decrease risk for falls    Status  On-going      PT LONG TERM GOAL #4   Title  Pt will verbalize understanding of fall prevention tips and strategies to reduce risk for falls    Status  On-going            Plan - 12/18/17 1019    Clinical Impression Statement  Instructed pt in [place] fall prevention program as initial HEP, highlighting desired activities and providing verbal cues for proper technique/performance. Pt able to perform all highlighted exercises safely with good technique during session, but anticipate need for further review due to memory issues.    Rehab Potential  Good    Clinical Impairments Affecting Rehab Potential  memory loss    PT Treatment/Interventions  Patient/family education;Neuromuscular re-education;Balance training;Therapeutic exercise;Therapeutic activities;Functional mobility training;Gait training;Stair training;ADLs/Self Care Home Management    Consulted and Agree with Plan of Care  Patient       Patient will benefit from skilled therapeutic intervention in order to improve the following deficits and impairments:  Decreased balance, Decreased safety awareness, Difficulty walking, Decreased strength, Decreased activity tolerance  Visit Diagnosis: Unsteadiness on feet  Muscle weakness (generalized)  Difficulty in walking, not elsewhere classified     Problem List Patient Active Problem List   Diagnosis Date Noted  . Memory dysfunction 01/30/2017  . Urinary frequency 01/19/2016  . Loss of weight 04/19/2015  . Dyspnea 04/06/2015  . Edema 12/28/2014  . Onychomycosis 08/31/2014  . Pain in lower limb 08/31/2014  . HAV (hallux abducto valgus)  08/31/2014  . Carpal tunnel syndrome, right 08/25/2014  . Bilateral bunions 08/25/2014  . Left foot pain 08/25/2013  . Mild cognitive impairment 04/12/2013  . Fatigue 04/08/2013  . Right carpal tunnel syndrome 07/22/2012  . Brachial neuritis or radiculitis 07/01/2012  .  Trigger finger, left 07/01/2012  . Osteopenia 06/01/2012  . Cervical spondylosis 06/01/2012  . Mild intermittent asthma 04/27/2011  . Degenerative joint disease 04/27/2011  . Bipolar affective disorder ([place]) 04/27/2011  . Chronic bronchitis 04/27/2011  . GERD (gastroesophageal reflux disease) 04/27/2011  . Allergic rhinitis, cause unspecified 04/27/2011  . HTN (hypertension) 04/27/2011  . Chronic cystitis 04/27/2011  . Colon polyps 04/27/2011  . Anemia 04/27/2011  . H/O: hysterectomy 04/27/2011  . TIA (transient ischemic attack) 04/27/2011  . Urinary incontinence 04/27/2011  . 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 Hyperlipidemia 04/27/2011  . Preventative health care 04/27/2011    Percival Spanish, PT, MPT 12/18/2017, 1:12 PM  [facility] [address]  [place], [place], [address] Phone: [phone]   Fax:  [phone]  Name: Madeline Mcdaniel MRN: [id number] Date of Birth: 1941/05/18

## 2017-12-23 ENCOUNTER — Ambulatory Visit: Payer: Medicare Other

## 2017-12-23 DIAGNOSIS — R262 Difficulty in walking, not elsewhere classified: Secondary | ICD-10-CM | POA: Diagnosis not present

## 2017-12-23 DIAGNOSIS — M6281 Muscle weakness (generalized): Secondary | ICD-10-CM | POA: Diagnosis not present

## 2017-12-23 DIAGNOSIS — R2681 Unsteadiness on feet: Secondary | ICD-10-CM | POA: Diagnosis not present

## 2017-12-23 NOTE — Therapy (Signed)
[facility] [address]  [place], [place], [address] Phone: [phone]   Fax:  [phone]  Physical Therapy Treatment  Patient Details  Name: Madeline Mcdaniel MRN: [id number] Date of Birth: [birth year] Referring Provider: Ellouise Newer, MD   Encounter Date: 12/23/2017  PT End of Session - 12/23/17 1318    Visit Number  3    Number of Visits  8    Date for PT Re-Evaluation  01/17/18    Authorization Type  Medicare & Dory Horn Plan G    PT Start Time  1315    PT Stop Time  1358    PT Time Calculation (min)  43 min    Activity Tolerance  Patient tolerated treatment well    Behavior During Therapy  [facility] for tasks assessed/performed       Past Medical History:  Diagnosis Date  . Allergic rhinitis, cause unspecified 04/27/2011  . Anemia 04/27/2011  . Asthma 04/27/2011  . Bipolar affective disorder ([place]) 04/27/2011  . Cervical spondylosis 06/01/2012  . Chronic bronchitis 04/27/2011  . Chronic cystitis 04/27/2011  . Colon polyps 04/27/2011  . Degenerative joint disease 04/27/2011  . GERD (gastroesophageal reflux disease) 04/27/2011  . HTN (hypertension) 04/27/2011  . Hyperlipidemia 04/27/2011  . Mild cognitive impairment 04/12/2013  . Osteopenia 06/01/2012  . 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 Right carpal tunnel syndrome 07/22/2012  . TIA (transient ischemic attack) 04/27/2011  . Urinary incontinence 04/27/2011    Past Surgical History:  Procedure Laterality Date  . BLADDER SUSPENSION     A&P REPAIR WITH MESH  . BREAST LUMPECTOMY     SEVERAL TIMES  . BUNIONECTOMY     LEFT FOOT  . CHOLECYSTECTOMY    . COLONOSCOPY  22YRS AGO   IN [place],PT DOES NOT REMEMBER DOCTOR  . Lauro Regulus Bunionectomy Left 09/09/2014   @ [facility]  . NEURECTOMY FOOT Left 09/09/2014   @ [place]  . ORTHOSCOPIC CARPETOMY     BOTH THUMBS  . POLYPECTOMY  10 YRS AGO   BENIGN POLYPS X2  . STERIOTACTIC BREAST BIOPSY     RIGHT  . TENDON TRANSFER   12/09/2012   Procedure: TENDON TRANSFER;  Surgeon: Wynonia Sours, MD;  Location: [place];  Service: Orthopedics;  Laterality: Left;  Tenodesis PIP with FDS Slip Left ring finger, Juggerknot  . TONSILLECTOMY    . TOTAL ABDOMINAL HYSTERECTOMY    . TRIGGER FINGER RELEASE     LEFT RING FINGER    There were no vitals filed for this visit.  Subjective Assessment - 12/23/17 1317    Subjective  Pt. reporting she wishes to review "squat activity" from [facility] program.      Patient Stated Goals  Would like to avoid falling.    Currently in Pain?  No/denies    Multiple Pain Sites  No                      [facility] Adult PT Treatment/Exercise - 12/23/17 1320      Self-Care   Self-Care  Other Self-Care Comments    Other Self-Care Comments   Review proper techinque with mini squat at counter and discussed proper ankle weights to purchase with pt. and husband issued handout info regarding 2-5lb ankle weights      Neuro Re-ed    Neuro Re-ed Details   Backwards tandem walk 2 x 30 ft with [facility] assist  Knee/Hip Exercises: Aerobic   Recumbent Bike  L1 x 6'      Knee/Hip Exercises: Standing   Functional Squat  10 reps;3 seconds    Functional Squat Limitations  mini at counter  required some cueing for proper technique     Other Standing Knee Exercises  Alternating toe-clears to 9" step x 10 reps; pt. with tendancy to lose focus on task; 1 LOB backwards requiring therapist assist to recover     Other Standing Knee Exercises  Side stepping with yellow TB 4 x 15 ft at counter       Knee/Hip Exercises: Seated   Long Arc Quad  Right;Left;1 set;10 reps    Long Arc Quad Weight  1 lbs.    Long Arc Quad Limitations  adduction ball squeeze     Hamstring Curl  Right;Left;10 reps    Hamstring Limitations  red TB      Knee/Hip Exercises: Supine   Bridges with Clamshell  Both;10 reps with sustained hip abd/ER isometrics into looped red TB       Knee/Hip Exercises: Sidelying    Clams  B clam shell with yellow TB x 10 reps                   PT Long Term Goals - 12/18/17 1019      PT LONG TERM GOAL #1   Title  Independent with ongoing strengthening and balance HEP    Status  On-going      PT LONG TERM GOAL #2   Title  B LE strength grossly 4/5 to 4+/5 or greater for improved stability    Status  On-going      PT LONG TERM GOAL #3   Title  Improve FGA to >/= 26/30 to decrease risk for falls    Status  On-going      PT LONG TERM GOAL #4   Title  Pt will verbalize understanding of fall prevention tips and strategies to reduce risk for falls    Status  On-going            Plan - 12/23/17 1322    Clinical Impression Statement  Pt. doing well today reporting OTAGO balance program going well however requested review of "mini squat".  Squat reviewed with pt. today with pt. able to demo proper technique following this.  Pt. tolerated addition of standing toe-clears, backwards tandem walk with therapist and supine level hip/LE strengthening well today.  Will continue to progress toward goals in coming visits.    Clinical Impairments Affecting Rehab Potential  memory loss    PT Treatment/Interventions  Patient/family education;Neuromuscular re-education;Balance training;Therapeutic exercise;Therapeutic activities;Functional mobility training;Gait training;Stair training;ADLs/Self Care Home Management    Consulted and Agree with Plan of Care  Patient       Patient will benefit from skilled therapeutic intervention in order to improve the following deficits and impairments:  Decreased balance, Decreased safety awareness, Difficulty walking, Decreased strength, Decreased activity tolerance  Visit Diagnosis: Unsteadiness on feet  Muscle weakness (generalized)  Difficulty in walking, not elsewhere classified     Problem List Patient Active Problem List   Diagnosis Date Noted  . Memory dysfunction 01/30/2017  . Urinary frequency 01/19/2016  .  Loss of weight 04/19/2015  . Dyspnea 04/06/2015  . Edema 12/28/2014  . Onychomycosis 08/31/2014  . Pain in lower limb 08/31/2014  . 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  . Carpal tunnel syndrome, right 08/25/2014  . Bilateral bunions 08/25/2014  . Left foot  pain 08/25/2013  . Mild cognitive impairment 04/12/2013  . Fatigue 04/08/2013  . Right carpal tunnel syndrome 07/22/2012  . Brachial neuritis or radiculitis 07/01/2012  . Trigger finger, left 07/01/2012  . Osteopenia 06/01/2012  . Cervical spondylosis 06/01/2012  . Mild intermittent asthma 04/27/2011  . Degenerative joint disease 04/27/2011  . Bipolar affective disorder ([place]) 04/27/2011  . Chronic bronchitis 04/27/2011  . GERD (gastroesophageal reflux disease) 04/27/2011  . Allergic rhinitis, cause unspecified 04/27/2011  . HTN (hypertension) 04/27/2011  . Chronic cystitis 04/27/2011  . Colon polyps 04/27/2011  . Anemia 04/27/2011  . H/O: hysterectomy 04/27/2011  . TIA (transient ischemic attack) 04/27/2011  . Urinary incontinence 04/27/2011  . 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 Hyperlipidemia 04/27/2011  . Preventative health care 04/27/2011    Bess Harvest, PTA 12/23/17 6:17 PM   [facility] [address]  [place], [place], [address] Phone: [phone]   Fax:  [phone]  Name: Madeline Mcdaniel MRN: [id number] Date of Birth: 07-Jan-1941

## 2017-12-26 ENCOUNTER — Ambulatory Visit: Payer: Medicare Other | Admitting: Physical Therapy

## 2017-12-26 ENCOUNTER — Encounter: Payer: Self-pay | Admitting: Physical Therapy

## 2017-12-26 DIAGNOSIS — R262 Difficulty in walking, not elsewhere classified: Secondary | ICD-10-CM | POA: Diagnosis not present

## 2017-12-26 DIAGNOSIS — R2681 Unsteadiness on feet: Secondary | ICD-10-CM

## 2017-12-26 DIAGNOSIS — M6281 Muscle weakness (generalized): Secondary | ICD-10-CM | POA: Diagnosis not present

## 2017-12-26 NOTE — Therapy (Signed)
[facility] [address]  [place], [place], [address] Phone: [phone]   Fax:  [phone]  Physical Therapy Treatment  Patient Details  Name: Madeline Mcdaniel MRN: [id number] Date of Birth: 26-Jul-1941 Referring Provider: Ellouise Newer, MD   Encounter Date: 12/26/2017  PT End of Session - 12/26/17 1054    Visit Number  4    Number of Visits  8    Date for PT Re-Evaluation  01/17/18    Authorization Type  Medicare & Dory Horn Plan G    PT Start Time  1054    PT Stop Time  1141    PT Time Calculation (min)  47 min    Activity Tolerance  Patient tolerated treatment well    Behavior During Therapy  [facility] for tasks assessed/performed       Past Medical History:  Diagnosis Date  . Allergic rhinitis, cause unspecified 04/27/2011  . Anemia 04/27/2011  . Asthma 04/27/2011  . Bipolar affective disorder ([place]) 04/27/2011  . Cervical spondylosis 06/01/2012  . Chronic bronchitis 04/27/2011  . Chronic cystitis 04/27/2011  . Colon polyps 04/27/2011  . Degenerative joint disease 04/27/2011  . GERD (gastroesophageal reflux disease) 04/27/2011  . HTN (hypertension) 04/27/2011  . Hyperlipidemia 04/27/2011  . Mild cognitive impairment 04/12/2013  . Osteopenia 06/01/2012  . 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 Right carpal tunnel syndrome 07/22/2012  . TIA (transient ischemic attack) 04/27/2011  . Urinary incontinence 04/27/2011    Past Surgical History:  Procedure Laterality Date  . BLADDER SUSPENSION     A&P REPAIR WITH MESH  . BREAST LUMPECTOMY     SEVERAL TIMES  . BUNIONECTOMY     LEFT FOOT  . CHOLECYSTECTOMY    . COLONOSCOPY  84YRS AGO   IN [place],PT DOES NOT REMEMBER DOCTOR  . Lauro Regulus Bunionectomy Left 09/09/2014   @ [facility]  . NEURECTOMY FOOT Left 09/09/2014   @ [place]  . ORTHOSCOPIC CARPETOMY     BOTH THUMBS  . POLYPECTOMY  10 YRS AGO   BENIGN POLYPS X2  . STERIOTACTIC BREAST BIOPSY     RIGHT  . TENDON TRANSFER   12/09/2012   Procedure: TENDON TRANSFER;  Surgeon: Wynonia Sours, MD;  Location: [place];  Service: Orthopedics;  Laterality: Left;  Tenodesis PIP with FDS Slip Left ring finger, Juggerknot  . TONSILLECTOMY    . TOTAL ABDOMINAL HYSTERECTOMY    . TRIGGER FINGER RELEASE     LEFT RING FINGER    There were no vitals filed for this visit.  Subjective Assessment - 12/26/17 1057    Subjective  Pt forgot her [place] booklet today, but feels like she is doing ok with these activities at home.    Patient Stated Goals  Would like to avoid falling.    Currently in Pain?  No/denies                      [facility] Adult PT Treatment/Exercise - 12/26/17 1054      High Level Balance   High Level Balance Activities  Side stepping;Backward walking;Direction changes;Tandem walking      Neuro Re-ed    Neuro Re-ed Details   Environmental scanning looking for sequential playing cards A-10 at various heights and locations      Knee/Hip Exercises: Aerobic   Nustep  L 4 x 6' (LE only)      Knee/Hip Exercises: Standing   SLS with Vectors  Airex  pad - B toe tap to 3 cones x5          Balance Exercises - 12/26/17 1054      Balance Exercises: Standing   Standing Eyes Opened  Narrow base of support (BOS);Foam/compliant surface;Cognitive challenge bean bag reach & toss    Tandem Stance  Eyes open;Foam/compliant surface;2 reps;15 secs mutiple LOB    Tandem Gait  Forward;Retro;Foam/compliant surface;3 reps    Partial Tandem Stance  Foam/compliant surface;Eyes open;30 secs;Eyes closed;10 secs + horiz & vert head turns with eyes open    Retro Gait  Upper extremity support cues to increase step length    Sidestepping  Foam/compliant support + cone knock down/righting    Other Standing Exercises  toe clears from Airex pad to 9" stool 2x10             PT Long Term Goals - 12/18/17 1019      PT LONG TERM GOAL #1   Title  Independent with ongoing strengthening and balance HEP     Status  On-going      PT LONG TERM GOAL #2   Title  B LE strength grossly 4/5 to 4+/5 or greater for improved stability    Status  On-going      PT LONG TERM GOAL #3   Title  Improve FGA to >/= 26/30 to decrease risk for falls    Status  On-going      PT LONG TERM GOAL #4   Title  Pt will verbalize understanding of fall prevention tips and strategies to reduce risk for falls    Status  On-going            Plan - 12/26/17 1059    Clinical Impression Statement  Pt forgetting to bring her [place] booklet with her to PT today, but denies any concerns re: activities in program. Will plan to reassess [place] next to assess for readiness to progress to "no support" with some activities as well as add stair activities as appropriate. Treatment today focusing narrow BOS on both firm and compliant surfaces, sidestepping, tandem and retro gait, all while performing dynamic activities, along with environmental scanning. Pt demonstrating mild LOB with some activities, but able to self-correct in majority of instances.    Rehab Potential  Good    Clinical Impairments Affecting Rehab Potential  memory loss    PT Treatment/Interventions  Patient/family education;Neuromuscular re-education;Balance training;Therapeutic exercise;Therapeutic activities;Functional mobility training;Gait training;Stair training;ADLs/Self Care Home Management    Consulted and Agree with Plan of Care  Patient       Patient will benefit from skilled therapeutic intervention in order to improve the following deficits and impairments:  Decreased balance, Decreased safety awareness, Difficulty walking, Decreased strength, Decreased activity tolerance  Visit Diagnosis: Unsteadiness on feet  Muscle weakness (generalized)  Difficulty in walking, not elsewhere classified     Problem List Patient Active Problem List   Diagnosis Date Noted  . Memory dysfunction 01/30/2017  . Urinary frequency 01/19/2016  . Loss of weight  04/19/2015  . Dyspnea 04/06/2015  . Edema 12/28/2014  . Onychomycosis 08/31/2014  . Pain in lower limb 08/31/2014  . 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  . Carpal tunnel syndrome, right 08/25/2014  . Bilateral bunions 08/25/2014  . Left foot pain 08/25/2013  . Mild cognitive impairment 04/12/2013  . Fatigue 04/08/2013  . Right carpal tunnel syndrome 07/22/2012  . Brachial neuritis or radiculitis 07/01/2012  . Trigger finger, left 07/01/2012  . Osteopenia 06/01/2012  . Cervical  spondylosis 06/01/2012  . Mild intermittent asthma 04/27/2011  . Degenerative joint disease 04/27/2011  . Bipolar affective disorder ([place]) 04/27/2011  . Chronic bronchitis 04/27/2011  . GERD (gastroesophageal reflux disease) 04/27/2011  . Allergic rhinitis, cause unspecified 04/27/2011  . HTN (hypertension) 04/27/2011  . Chronic cystitis 04/27/2011  . Colon polyps 04/27/2011  . Anemia 04/27/2011  . H/O: hysterectomy 04/27/2011  . TIA (transient ischemic attack) 04/27/2011  . Urinary incontinence 04/27/2011  . 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 Hyperlipidemia 04/27/2011  . Preventative health care 04/27/2011    Percival Spanish, PT, MPT 12/26/2017, 12:12 PM  [facility] [address]  [place], [place], [address] Phone: [phone]   Fax:  [phone]  Name: KENIAH KLEMMER MRN: [id number] Date of Birth: [birth year]

## 2017-12-30 ENCOUNTER — Encounter: Payer: Self-pay | Admitting: Physical Therapy

## 2017-12-30 ENCOUNTER — Ambulatory Visit: Payer: Medicare Other | Admitting: Physical Therapy

## 2017-12-30 DIAGNOSIS — R262 Difficulty in walking, not elsewhere classified: Secondary | ICD-10-CM | POA: Diagnosis not present

## 2017-12-30 DIAGNOSIS — R2681 Unsteadiness on feet: Secondary | ICD-10-CM

## 2017-12-30 DIAGNOSIS — M6281 Muscle weakness (generalized): Secondary | ICD-10-CM | POA: Diagnosis not present

## 2017-12-30 NOTE — Therapy (Signed)
[facility] [address]  [place], [place], [address] Phone: [phone]   Fax:  [phone]  Physical Therapy Treatment  Patient Details  Name: Madeline Mcdaniel MRN: [id number] Date of Birth: 1941/06/06 Referring Provider: Ellouise Newer, MD   Encounter Date: 12/30/2017  PT End of Session - 12/30/17 1101    Visit Number  5    Number of Visits  8    Date for PT Re-Evaluation  01/17/18    Authorization Type  Medicare & Dory Horn Plan G    PT Start Time  1101    PT Stop Time  1143    PT Time Calculation (min)  42 min    Activity Tolerance  Patient tolerated treatment well    Behavior During Therapy  [facility] for tasks assessed/performed       Past Medical History:  Diagnosis Date  . Allergic rhinitis, cause unspecified 04/27/2011  . Anemia 04/27/2011  . Asthma 04/27/2011  . Bipolar affective disorder ([place]) 04/27/2011  . Cervical spondylosis 06/01/2012  . Chronic bronchitis 04/27/2011  . Chronic cystitis 04/27/2011  . Colon polyps 04/27/2011  . Degenerative joint disease 04/27/2011  . GERD (gastroesophageal reflux disease) 04/27/2011  . HTN (hypertension) 04/27/2011  . Hyperlipidemia 04/27/2011  . Mild cognitive impairment 04/12/2013  . Osteopenia 06/01/2012  . 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 Right carpal tunnel syndrome 07/22/2012  . TIA (transient ischemic attack) 04/27/2011  . Urinary incontinence 04/27/2011    Past Surgical History:  Procedure Laterality Date  . BLADDER SUSPENSION     A&P REPAIR WITH MESH  . BREAST LUMPECTOMY     SEVERAL TIMES  . BUNIONECTOMY     LEFT FOOT  . CHOLECYSTECTOMY    . COLONOSCOPY  59YRS AGO   IN [place],PT DOES NOT REMEMBER DOCTOR  . Lauro Regulus Bunionectomy Left 09/09/2014   @ [facility]  . NEURECTOMY FOOT Left 09/09/2014   @ [place]  . ORTHOSCOPIC CARPETOMY     BOTH THUMBS  . POLYPECTOMY  10 YRS AGO   BENIGN POLYPS X2  . STERIOTACTIC BREAST BIOPSY     RIGHT  . TENDON TRANSFER   12/09/2012   Procedure: TENDON TRANSFER;  Surgeon: Wynonia Sours, MD;  Location: [facility];  Service: Orthopedics;  Laterality: Left;  Tenodesis PIP with FDS Slip Left ring finger, Juggerknot  . TONSILLECTOMY    . TOTAL ABDOMINAL HYSTERECTOMY    . TRIGGER FINGER RELEASE     LEFT RING FINGER    There were no vitals filed for this visit.  Subjective Assessment - 12/30/17 1103    Subjective  Pt doing well today.    Patient Stated Goals  Would like to avoid falling.    Currently in Pain?  No/denies                      [facility] Adult PT Treatment/Exercise - 12/30/17 1101      High Level Balance   High Level Balance Activities  Side stepping;Braiding;Backward walking;Direction changes;Turns;Sudden stops;Head turns;Figure 8 turns    High Level Balance Comments  Walking on uneven surfaces - red mat doubled on floor with balance pebbles btw layers       Knee/Hip Exercises: Aerobic   Nustep  L5 x 6' (LE only)          Balance Exercises - 12/30/17 1101      OTAGO PROGRAM   Ankle Plantorflexors  20 reps, no support  Ankle Dorsiflexors  20 reps, no support    Knee Bends  10 reps, no support    Backwards Walking  No support    Walking and Turning Around  No assistive device    Sideways Walking  No assistive device    Tandem Stance  10 seconds, no support    Tandem Walk  Support    One Leg Stand  10 seconds, no support    Heel Walking  No support    Toe Walk  No support    Heel Toe Walking Backward  No support    Sit to Stand  10 reps, no support from 9" stool + Airex pad    Stair Walking  13 steps - light touch to rail on R             PT Long Term Goals - 12/18/17 1019      PT LONG TERM GOAL #1   Title  Independent with ongoing strengthening and balance HEP    Status  On-going      PT LONG TERM GOAL #2   Title  B LE strength grossly 4/5 to 4+/5 or greater for improved stability    Status  On-going      PT LONG TERM GOAL #3   Title   Improve FGA to >/= 26/30 to decrease risk for falls    Status  On-going      PT LONG TERM GOAL #4   Title  Pt will verbalize understanding of fall prevention tips and strategies to reduce risk for falls    Status  On-going            Plan - 12/30/17 1105    Clinical Impression Statement  [name] doing well with [facility] program, able to progress all of balance activities to "no support" with exception of continued finger tip support for fwd tandem gait. Introduced more dynamic gait activities while walking in long hallways, changing patterns along the way  - pt with some difficulty during transitions but no LOB. Pt able to safely negotiate uneven surfaces but struggled with attempting to maintain a set pattern on uneven surface.    Rehab Potential  Good    Clinical Impairments Affecting Rehab Potential  memory loss    PT Treatment/Interventions  Patient/family education;Neuromuscular re-education;Balance training;Therapeutic exercise;Therapeutic activities;Functional mobility training;Gait training;Stair training;ADLs/Self Care Home Management    Consulted and Agree with Plan of Care  Patient       Patient will benefit from skilled therapeutic intervention in order to improve the following deficits and impairments:  Decreased balance, Decreased safety awareness, Difficulty walking, Decreased strength, Decreased activity tolerance  Visit Diagnosis: Unsteadiness on feet  Muscle weakness (generalized)  Difficulty in walking, not elsewhere classified     Problem List Patient Active Problem List   Diagnosis Date Noted  . Memory dysfunction 01/30/2017  . Urinary frequency 01/19/2016  . Loss of weight 04/19/2015  . Dyspnea 04/06/2015  . Edema 12/28/2014  . Onychomycosis 08/31/2014  . Pain in lower limb 08/31/2014  . 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  . Carpal tunnel syndrome, right 08/25/2014  . Bilateral bunions 08/25/2014  . Left foot pain 08/25/2013  . Mild cognitive  impairment 04/12/2013  . Fatigue 04/08/2013  . Right carpal tunnel syndrome 07/22/2012  . Brachial neuritis or radiculitis 07/01/2012  . Trigger finger, left 07/01/2012  . Osteopenia 06/01/2012  . Cervical spondylosis 06/01/2012  . Mild intermittent asthma 04/27/2011  . Degenerative joint disease 04/27/2011  . Bipolar  affective disorder ([place]) 04/27/2011  . Chronic bronchitis 04/27/2011  . GERD (gastroesophageal reflux disease) 04/27/2011  . Allergic rhinitis, cause unspecified 04/27/2011  . HTN (hypertension) 04/27/2011  . Chronic cystitis 04/27/2011  . Colon polyps 04/27/2011  . Anemia 04/27/2011  . H/O: hysterectomy 04/27/2011  . TIA (transient ischemic attack) 04/27/2011  . Urinary incontinence 04/27/2011  . 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 Hyperlipidemia 04/27/2011  . Preventative health care 04/27/2011    Percival Spanish, PT, MPT 12/30/2017, 11:54 AM  [facility] [address]  [place], [place], [address] Phone: [phone]   Fax:  [phone]  Name: Madeline Mcdaniel MRN: [id number] Date of Birth: [birth year]

## 2018-01-02 ENCOUNTER — Ambulatory Visit: Payer: Medicare Other | Admitting: Physical Therapy

## 2018-01-03 ENCOUNTER — Other Ambulatory Visit: Payer: Self-pay | Admitting: Internal Medicine

## 2018-01-06 ENCOUNTER — Encounter: Payer: Self-pay | Admitting: Physical Therapy

## 2018-01-06 ENCOUNTER — Ambulatory Visit: Payer: Medicare Other | Attending: Neurology | Admitting: Physical Therapy

## 2018-01-06 DIAGNOSIS — M6281 Muscle weakness (generalized): Secondary | ICD-10-CM | POA: Diagnosis not present

## 2018-01-06 DIAGNOSIS — R262 Difficulty in walking, not elsewhere classified: Secondary | ICD-10-CM | POA: Diagnosis not present

## 2018-01-06 DIAGNOSIS — R2681 Unsteadiness on feet: Secondary | ICD-10-CM | POA: Diagnosis not present

## 2018-01-06 NOTE — Patient Instructions (Signed)
Fall Prevention in the Home Falls can cause injuries and can affect people from all age groups. There are many simple things that you can do to make your home safe and to help prevent falls. What can I do on the outside of my home?  Regularly repair the edges of walkways and driveways and fix any cracks.  Remove high doorway thresholds.  Trim any shrubbery on the main path into your home.  Use bright outdoor lighting.  Clear walkways of debris and clutter, including tools and rocks.  Regularly check that handrails are securely fastened and in good repair. Both sides of any steps should have handrails.  Install guardrails along the edges of any raised decks or porches.  Have leaves, snow, and ice cleared regularly.  Use sand or salt on walkways during winter months.  In the garage, clean up any spills right away, including grease or oil spills. What can I do in the bathroom?  Use night lights.  Install grab bars by the toilet and in the tub and shower. Do not use towel bars as grab bars.  Use non-skid mats or decals on the floor of the tub or shower.  If you need to sit down while you are in the shower, use a plastic, non-slip stool.  Keep the floor dry. Immediately clean up any water that spills on the floor.  Remove soap buildup in the tub or shower on a regular basis.  Attach bath mats securely with double-sided non-slip rug tape.  Remove throw rugs and other tripping hazards from the floor. What can I do in the bedroom?  Use night lights.  Make sure that a bedside light is easy to reach.  Do not use oversized bedding that drapes onto the floor.  Have a firm chair that has side arms to use for getting dressed.  Remove throw rugs and other tripping hazards from the floor. What can I do in the kitchen?  Clean up any spills right away.  Avoid walking on wet floors.  Place frequently used items in easy-to-reach places.  If you need to reach for something above  you, use a sturdy step stool that has a grab bar.  Keep electrical cables out of the way.  Do not use floor polish or wax that makes floors slippery. If you have to use wax, make sure that it is non-skid floor wax.  Remove throw rugs and other tripping hazards from the floor. What can I do in the stairways?  Do not leave any items on the stairs.  Make sure that there are handrails on both sides of the stairs. Fix handrails that are broken or loose. Make sure that handrails are as long as the stairways.  Check any carpeting to make sure that it is firmly attached to the stairs. Fix any carpet that is loose or worn.  Avoid having throw rugs at the top or bottom of stairways, or secure the rugs with carpet tape to prevent them from moving.  Make sure that you have a light switch at the top of the stairs and the bottom of the stairs. If you do not have them, have them installed. What are some other fall prevention tips?  Wear closed-toe shoes that fit well and support your feet. Wear shoes that have rubber soles or low heels.  When you use a stepladder, make sure that it is completely opened and that the sides are firmly locked. Have someone hold the ladder while you are using   it. Do not climb a closed stepladder.  Add color or contrast paint or tape to grab bars and handrails in your home. Place contrasting color strips on the first and last steps.  Use mobility aids as needed, such as canes, walkers, scooters, and crutches.  Turn on lights if it is dark. Replace any light bulbs that burn out.  Set up furniture so that there are clear paths. Keep the furniture in the same spot.  Fix any uneven floor surfaces.  Choose a carpet design that does not hide the edge of steps of a stairway.  Be aware of any and all pets.  Review your medicines with your healthcare provider. Some medicines can cause dizziness or changes in blood pressure, which increase your risk of falling. Talk with  your health care provider about other ways that you can decrease your risk of falls. This may include working with a physical therapist or trainer to improve your strength, balance, and endurance. This information is not intended to replace advice given to you by your health care provider. Make sure you discuss any questions you have with your health care provider. Document Released: 11/09/2002 Document Revised: 04/17/2016 Document Reviewed: 12/24/2014 Elsevier Interactive Patient Education  2018 Elsevier Inc.  

## 2018-01-06 NOTE — Therapy (Signed)
[facility] [address]  [place], [place], [address] Phone: [phone]   Fax:  [phone]  Physical Therapy Treatment  Patient Details  Name: Madeline Mcdaniel MRN: [id number] Date of Birth: November 08, 1941 Referring Provider: Ellouise Newer, MD   Encounter Date: 01/06/2018  PT End of Session - 01/06/18 1103    Visit Number  6    Number of Visits  8    Date for PT Re-Evaluation  01/17/18    Authorization Type  Medicare & [employer] Plan G    PT Start Time  1103    PT Stop Time  1147    PT Time Calculation (min)  44 min    Activity Tolerance  Patient tolerated treatment well    Behavior During Therapy  [facility] for tasks assessed/performed       Past Medical History:  Diagnosis Date  . Allergic rhinitis, cause unspecified 04/27/2011  . Anemia 04/27/2011  . Asthma 04/27/2011  . Bipolar affective disorder ([place]) 04/27/2011  . Cervical spondylosis 06/01/2012  . Chronic bronchitis 04/27/2011  . Chronic cystitis 04/27/2011  . Colon polyps 04/27/2011  . Degenerative joint disease 04/27/2011  . GERD (gastroesophageal reflux disease) 04/27/2011  . HTN (hypertension) 04/27/2011  . Hyperlipidemia 04/27/2011  . Mild cognitive impairment 04/12/2013  . Osteopenia 06/01/2012  . 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 Right carpal tunnel syndrome 07/22/2012  . TIA (transient ischemic attack) 04/27/2011  . Urinary incontinence 04/27/2011    Past Surgical History:  Procedure Laterality Date  . BLADDER SUSPENSION     A&P REPAIR WITH MESH  . BREAST LUMPECTOMY     SEVERAL TIMES  . BUNIONECTOMY     LEFT FOOT  . CHOLECYSTECTOMY    . COLONOSCOPY  61YRS AGO   IN [place],PT DOES NOT REMEMBER DOCTOR  . Lauro Regulus Bunionectomy Left 09/09/2014   @ [facility]  . NEURECTOMY FOOT Left 09/09/2014   @ [place]  . ORTHOSCOPIC CARPETOMY     BOTH THUMBS  . POLYPECTOMY  10 YRS AGO   BENIGN POLYPS X2  . STERIOTACTIC BREAST BIOPSY     RIGHT  . TENDON TRANSFER  12/09/2012    Procedure: TENDON TRANSFER;  Surgeon: Wynonia Sours, MD;  Location: [facility];  Service: Orthopedics;  Laterality: Left;  Tenodesis PIP with FDS Slip Left ring finger, [place]  . TONSILLECTOMY    . TOTAL ABDOMINAL HYSTERECTOMY    . TRIGGER FINGER RELEASE     LEFT RING FINGER    There were no vitals filed for this visit.  Subjective Assessment - 01/06/18 1105    Subjective  Pt reporting she feels like she is doing better with her balance, but still notes challenge with tandem gait & single limb stance.    Patient Stated Goals  Would like to avoid falling.    Currently in Pain?  No/denies         [facility] PT Assessment - 01/06/18 1103      Assessment   Medical Diagnosis  Gait instability    Referring Provider  Ellouise Newer, MD    Next MD Visit  04/22/18      Ambulation/Gait   Gait velocity  4.64 ft/sec      Standardized Balance Assessment   10 Meter Walk  7.07      Timed Up and Go Test   Normal TUG (seconds)  10    Manual TUG (seconds)  10.78    Cognitive TUG (seconds)  15.78      Functional Gait  Assessment   Gait assessed   --    Gait Level Surface  Walks 20 ft in less than 5.5 sec, no assistive devices, good speed, no evidence for imbalance, normal gait pattern, deviates no more than 6 in outside of the 12 in walkway width.    Change in Gait Speed  Able to smoothly change walking speed without loss of balance or gait deviation. Deviate no more than 6 in outside of the 12 in walkway width.    Gait with Horizontal Head Turns  Performs head turns smoothly with no change in gait. Deviates no more than 6 in outside 12 in walkway width    Gait with Vertical Head Turns  Performs head turns with no change in gait. Deviates no more than 6 in outside 12 in walkway width.    Gait and Pivot Turn  Pivot turns safely within 3 sec and stops quickly with no loss of balance.    Step Over Obstacle  Is able to step over 2 stacked shoe boxes taped together (9 in total height)  without changing gait speed. No evidence of imbalance.    Gait with Narrow Base of Support  Ambulates 4-7 steps.    Gait with Eyes Closed  Walks 20 ft, slow speed, abnormal gait pattern, evidence for imbalance, deviates 10-15 in outside 12 in walkway width. Requires more than 9 sec to ambulate 20 ft.    Ambulating Backwards  Walks 20 ft, uses assistive device, slower speed, mild gait deviations, deviates 6-10 in outside 12 in walkway width.    Steps  Alternating feet, no rail.    Total Score  25    FGA comment:  --                  [facility] Adult PT Treatment/Exercise - 01/06/18 1103      Knee/Hip Exercises: Aerobic   Recumbent Bike  L2 x 6'          Balance Exercises - 01/06/18 1103      Balance Exercises: Standing   SLS with Vectors  Foam/compliant surface;5 reps B toe tap to 3 cones    Gait with Head Turns  Forward;1 rep    Tandem Gait  Forward;Retro;Foam/compliant surface;5 reps    Retro Gait  3 reps eyes closed x 1 rep - tendency for L drift    Sidestepping  Foam/compliant support;2 reps + cone knock down/righting    Other Standing Exercises  gait with eyes closed x2 (tendency for R drift)             PT Long Term Goals - 01/06/18 1132      PT LONG TERM GOAL #1   Title  Independent with ongoing strengthening and balance HEP    Status  Partially Met      PT LONG TERM GOAL #2   Title  B LE strength grossly 4/5 to 4+/5 or greater for improved stability    Status  On-going      PT LONG TERM GOAL #3   Title  Improve FGA to >/= 26/30 to decrease risk for falls    Status  On-going      PT LONG TERM GOAL #4   Title  Pt will verbalize understanding of fall prevention tips and strategies to reduce risk for falls    Status  Achieved            Plan - 01/06/18 1108  Clinical Impression Statement  Madeline Mcdaniel noting improved balance but still having difficulty with tadem gait SLS activities. FGA demonstrating 3 point increase to 25/30 with greatest  limitations in tandem gait, eyes closed and backward walking. Given continued perceived deficits and areas of limitations noted on standardized testing will plan to schedule out remaining visits in current certification with emphasis on balance and dynamic gait activities to address aforementioned deficits.    Rehab Potential  Good    Clinical Impairments Affecting Rehab Potential  memory loss    PT Treatment/Interventions  Patient/family education;Neuromuscular re-education;Balance training;Therapeutic exercise;Therapeutic activities;Functional mobility training;Gait training;Stair training;ADLs/Self Care Home Management    Consulted and Agree with Plan of Care  Patient       Patient will benefit from skilled therapeutic intervention in order to improve the following deficits and impairments:  Decreased balance, Decreased safety awareness, Difficulty walking, Decreased strength, Decreased activity tolerance  Visit Diagnosis: Unsteadiness on feet  Muscle weakness (generalized)  Difficulty in walking, not elsewhere classified     Problem List Patient Active Problem List   Diagnosis Date Noted  . Memory dysfunction 01/30/2017  . Urinary frequency 01/19/2016  . Loss of weight 04/19/2015  . Dyspnea 04/06/2015  . Edema 12/28/2014  . Onychomycosis 08/31/2014  . Pain in lower limb 08/31/2014  . 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  . Carpal tunnel syndrome, right 08/25/2014  . Bilateral bunions 08/25/2014  . Left foot pain 08/25/2013  . Mild cognitive impairment 04/12/2013  . Fatigue 04/08/2013  . Right carpal tunnel syndrome 07/22/2012  . Brachial neuritis or radiculitis 07/01/2012  . Trigger finger, left 07/01/2012  . Osteopenia 06/01/2012  . Cervical spondylosis 06/01/2012  . Mild intermittent asthma 04/27/2011  . Degenerative joint disease 04/27/2011  . Bipolar affective disorder ([place]) 04/27/2011  . Chronic bronchitis 04/27/2011  . GERD (gastroesophageal reflux disease)  04/27/2011  . Allergic rhinitis, cause unspecified 04/27/2011  . HTN (hypertension) 04/27/2011  . Chronic cystitis 04/27/2011  . Colon polyps 04/27/2011  . Anemia 04/27/2011  . H/O: hysterectomy 04/27/2011  . TIA (transient ischemic attack) 04/27/2011  . Urinary incontinence 04/27/2011  . 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 Hyperlipidemia 04/27/2011  . Preventative health care 04/27/2011    Percival Spanish, PT, MPT 01/06/2018, 12:00 PM  [facility] [address]  [place], [place], [address] Phone: [phone]   Fax:  [phone]  Name: Madeline Mcdaniel MRN: [id number] Date of Birth: 1941-06-17

## 2018-01-13 ENCOUNTER — Ambulatory Visit: Payer: Medicare Other | Admitting: Physical Therapy

## 2018-01-15 ENCOUNTER — Ambulatory Visit: Payer: Medicare Other | Admitting: Physical Therapy

## 2018-01-15 ENCOUNTER — Other Ambulatory Visit: Payer: Self-pay | Admitting: Internal Medicine

## 2018-01-15 ENCOUNTER — Encounter: Payer: Self-pay | Admitting: Physical Therapy

## 2018-01-15 DIAGNOSIS — R2681 Unsteadiness on feet: Secondary | ICD-10-CM | POA: Diagnosis not present

## 2018-01-15 DIAGNOSIS — M6281 Muscle weakness (generalized): Secondary | ICD-10-CM | POA: Diagnosis not present

## 2018-01-15 DIAGNOSIS — R262 Difficulty in walking, not elsewhere classified: Secondary | ICD-10-CM

## 2018-01-15 NOTE — Therapy (Signed)
[facility] [address]  [place], [place], [address] Phone: [phone]   Fax:  [phone]  Physical Therapy Treatment  Patient Details  Name: Madeline Mcdaniel MRN: [id number] Date of Birth: December 09, 1940 Referring Provider: Ellouise Newer, MD   Encounter Date: 01/15/2018  PT End of Session - 01/15/18 0932    Visit Number  7    Number of Visits  8    Date for PT Re-Evaluation  01/17/18    Authorization Type  Medicare & Dory Horn Plan G    PT Start Time  0932    PT Stop Time  1013    PT Time Calculation (min)  41 min    Activity Tolerance  Patient tolerated treatment well    Behavior During Therapy  [facility] for tasks assessed/performed       Past Medical History:  Diagnosis Date  . Allergic rhinitis, cause unspecified 04/27/2011  . Anemia 04/27/2011  . Asthma 04/27/2011  . Bipolar affective disorder ([place]) 04/27/2011  . Cervical spondylosis 06/01/2012  . Chronic bronchitis 04/27/2011  . Chronic cystitis 04/27/2011  . Colon polyps 04/27/2011  . Degenerative joint disease 04/27/2011  . GERD (gastroesophageal reflux disease) 04/27/2011  . HTN (hypertension) 04/27/2011  . Hyperlipidemia 04/27/2011  . Mild cognitive impairment 04/12/2013  . Osteopenia 06/01/2012  . 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 Right carpal tunnel syndrome 07/22/2012  . TIA (transient ischemic attack) 04/27/2011  . Urinary incontinence 04/27/2011    Past Surgical History:  Procedure Laterality Date  . BLADDER SUSPENSION     A&P REPAIR WITH MESH  . BREAST LUMPECTOMY     SEVERAL TIMES  . BUNIONECTOMY     LEFT FOOT  . CHOLECYSTECTOMY    . COLONOSCOPY  75YRS AGO   IN [place],PT DOES NOT REMEMBER DOCTOR  . Lauro Regulus Bunionectomy Left 09/09/2014   @ [facility]  . NEURECTOMY FOOT Left 09/09/2014   @ [place]  . ORTHOSCOPIC CARPETOMY     BOTH THUMBS  . POLYPECTOMY  10 YRS AGO   BENIGN POLYPS X2  . STERIOTACTIC BREAST BIOPSY     RIGHT  . TENDON TRANSFER   12/09/2012   Procedure: TENDON TRANSFER;  Surgeon: Wynonia Sours, MD;  Location: [facility];  Service: Orthopedics;  Laterality: Left;  Tenodesis PIP with FDS Slip Left ring finger, [place]  . TONSILLECTOMY    . TOTAL ABDOMINAL HYSTERECTOMY    . TRIGGER FINGER RELEASE     LEFT RING FINGER    There were no vitals filed for this visit.  Subjective Assessment - 01/15/18 0934    Subjective  Pt reports she has been practicing on the stairs and feels that she is doing a little better on that.    Patient Stated Goals  Would like to avoid falling.    Currently in Pain?  No/denies         [facility] PT Assessment - 01/15/18 0932      Assessment   Medical Diagnosis  Gait instability    Referring Provider  Ellouise Newer, MD    Next MD Visit  04/22/18      Strength   Right Hip Flexion  4+/5    Right Hip Extension  4+/5    Right Hip External Rotation   4/5    Right Hip Internal Rotation  4+/5    Right Hip ABduction  4+/5    Right Hip ADduction  4+/5    Left Hip Flexion  4+/5    Left Hip Extension  4+/5    Left Hip External Rotation  4/5    Left Hip Internal Rotation  4+/5    Left Hip ABduction  4+/5    Left Hip ADduction  4+/5    Right Knee Flexion  4+/5    Right Knee Extension  4+/5    Left Knee Flexion  4+/5    Left Knee Extension  4+/5    Right Ankle Dorsiflexion  4+/5    Right Ankle Plantar Flexion  5/5    Left Ankle Dorsiflexion  4+/5    Left Ankle Plantar Flexion  5/5      Functional Gait  Assessment   Gait Level Surface  Walks 20 ft in less than 5.5 sec, no assistive devices, good speed, no evidence for imbalance, normal gait pattern, deviates no more than 6 in outside of the 12 in walkway width.    Change in Gait Speed  Able to smoothly change walking speed without loss of balance or gait deviation. Deviate no more than 6 in outside of the 12 in walkway width.    Gait with Horizontal Head Turns  Performs head turns smoothly with no change in gait. Deviates no more  than 6 in outside 12 in walkway width    Gait with Vertical Head Turns  Performs head turns with no change in gait. Deviates no more than 6 in outside 12 in walkway width.    Gait and Pivot Turn  Pivot turns safely within 3 sec and stops quickly with no loss of balance.    Step Over Obstacle  Is able to step over 2 stacked shoe boxes taped together (9 in total height) without changing gait speed. No evidence of imbalance.    Gait with Narrow Base of Support  Is able to ambulate for 10 steps heel to toe with no staggering.    Gait with Eyes Closed  Walks 20 ft, uses assistive device, slower speed, mild gait deviations, deviates 6-10 in outside 12 in walkway width. Ambulates 20 ft in less than 9 sec but greater than 7 sec.    Ambulating Backwards  Walks 20 ft, uses assistive device, slower speed, mild gait deviations, deviates 6-10 in outside 12 in walkway width.    Steps  Alternating feet, no rail.    Total Score  28                  [facility] Adult PT Treatment/Exercise - 01/15/18 0932      Knee/Hip Exercises: Aerobic   Nustep  L5 x 6' (LE only)             PT Education - 01/15/18 1013    Education provided  Yes    Education Details  HEP review/update & recommendations for continued activity    Person(s) Educated  Patient    Methods  Explanation;Demonstration;Handout    Comprehension  Verbalized understanding;Returned demonstration          PT Long Term Goals - 01/15/18 0939      PT LONG TERM GOAL #1   Title  Independent with ongoing strengthening and balance HEP    Status  Achieved      PT LONG TERM GOAL #2   Title  B LE strength grossly 4/5 to 4+/5 or greater for improved stability    Status  Achieved      PT LONG TERM GOAL #3   Title  Improve FGA to >/= 26/30 to decrease  risk for falls    Status  Achieved      PT LONG TERM GOAL #4   Title  Pt will verbalize understanding of fall prevention tips and strategies to reduce risk for falls    Status  Achieved             Plan - 01/15/18 0939    Clinical Impression Statement  Jere has demonstrated excellent progress with PT with gains noted in LE strength, gait stability and balance. All goals met for this episode and pt feels confident with continuing on her own with the [place] fall prevention program and HEP activities as well as remaining active at the [facility]. Will proceed with discharge from PT for this episode.    Rehab Potential  Good    Clinical Impairments Affecting Rehab Potential  memory loss    PT Treatment/Interventions  Patient/family education;Neuromuscular re-education;Balance training;Therapeutic exercise;Therapeutic activities;Functional mobility training;Gait training;Stair training;ADLs/Self Care Home Management    PT Next Visit Plan  Discharge    Consulted and Agree with Plan of Care  Patient       Patient will benefit from skilled therapeutic intervention in order to improve the following deficits and impairments:  Decreased balance, Decreased safety awareness, Difficulty walking, Decreased strength, Decreased activity tolerance  Visit Diagnosis: Unsteadiness on feet  Muscle weakness (generalized)  Difficulty in walking, not elsewhere classified     Problem List Patient Active Problem List   Diagnosis Date Noted  . Memory dysfunction 01/30/2017  . Urinary frequency 01/19/2016  . Loss of weight 04/19/2015  . Dyspnea 04/06/2015  . Edema 12/28/2014  . Onychomycosis 08/31/2014  . Pain in lower limb 08/31/2014  . 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  . Carpal tunnel syndrome, right 08/25/2014  . Bilateral bunions 08/25/2014  . Left foot pain 08/25/2013  . Mild cognitive impairment 04/12/2013  . Fatigue 04/08/2013  . Right carpal tunnel syndrome 07/22/2012  . Brachial neuritis or radiculitis 07/01/2012  . Trigger finger, left 07/01/2012  . Osteopenia 06/01/2012  . Cervical spondylosis 06/01/2012  . Mild intermittent asthma 04/27/2011  . Degenerative  joint disease 04/27/2011  . Bipolar affective disorder ([place]) 04/27/2011  . Chronic bronchitis 04/27/2011  . GERD (gastroesophageal reflux disease) 04/27/2011  . Allergic rhinitis, cause unspecified 04/27/2011  . HTN (hypertension) 04/27/2011  . Chronic cystitis 04/27/2011  . Colon polyps 04/27/2011  . Anemia 04/27/2011  . H/O: hysterectomy 04/27/2011  . TIA (transient ischemic attack) 04/27/2011  . Urinary incontinence 04/27/2011  . 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 Hyperlipidemia 04/27/2011  . Preventative health care 04/27/2011    PHYSICAL THERAPY DISCHARGE SUMMARY  Visits from Start of Care: 7  Current functional level related to goals / functional outcomes:   Refer to above clinical impression.   Remaining deficits:   As above.   Education / Equipment:   HEP, [employer], Fall prevention tips & strategies, Recommendations for continued activity  Plan: Patient agrees to discharge.  Patient goals were met. Patient is being discharged due to meeting the stated rehab goals.  ?????       Percival Spanish, PT, MPT 01/15/2018, 12:53 PM  [facility] [address]  [place], [place], [address] Phone: [phone]   Fax:  [phone]  Name: Madeline Mcdaniel MRN: [id number] Date of Birth: Dec 27, 1940

## 2018-01-30 ENCOUNTER — Other Ambulatory Visit (INDEPENDENT_AMBULATORY_CARE_PROVIDER_SITE_OTHER): Payer: Medicare Other

## 2018-01-30 ENCOUNTER — Encounter: Payer: Self-pay | Admitting: Internal Medicine

## 2018-01-30 ENCOUNTER — Ambulatory Visit (INDEPENDENT_AMBULATORY_CARE_PROVIDER_SITE_OTHER): Payer: Medicare Other | Admitting: Internal Medicine

## 2018-01-30 VITALS — BP 124/76 | HR 54 | Temp 97.6°F | Ht <= 58 in | Wt 93.0 lb

## 2018-01-30 DIAGNOSIS — I10 Essential (primary) hypertension: Secondary | ICD-10-CM

## 2018-01-30 DIAGNOSIS — E785 Hyperlipidemia, unspecified: Secondary | ICD-10-CM

## 2018-01-30 DIAGNOSIS — J4520 Mild intermittent asthma, uncomplicated: Secondary | ICD-10-CM | POA: Diagnosis not present

## 2018-01-30 LAB — CBC WITH DIFFERENTIAL/PLATELET
Basophils Absolute: 0 10*3/uL (ref 0.0–0.1)
Basophils Relative: 0.4 % (ref 0.0–3.0)
EOS ABS: 0.1 10*3/uL (ref 0.0–0.7)
Eosinophils Relative: 1.4 % (ref 0.0–5.0)
HCT: 38.8 % (ref 36.0–46.0)
HEMOGLOBIN: 13 g/dL (ref 12.0–15.0)
LYMPHS PCT: 44.9 % (ref 12.0–46.0)
Lymphs Abs: 3 10*3/uL (ref 0.7–4.0)
MCHC: 33.5 g/dL (ref 30.0–36.0)
MCV: 88.8 fl (ref 78.0–100.0)
MONO ABS: 0.6 10*3/uL (ref 0.1–1.0)
Monocytes Relative: 8.3 % (ref 3.0–12.0)
Neutro Abs: 3 10*3/uL (ref 1.4–7.7)
Neutrophils Relative %: 45 % (ref 43.0–77.0)
Platelets: 149 10*3/uL — ABNORMAL LOW (ref 150.0–400.0)
RBC: 4.37 Mil/uL (ref 3.87–5.11)
RDW: 13 % (ref 11.5–15.5)
WBC: 6.7 10*3/uL (ref 4.0–10.5)

## 2018-01-30 LAB — BASIC METABOLIC PANEL
BUN: 19 mg/dL (ref 6–23)
CALCIUM: 9.9 mg/dL (ref 8.4–10.5)
CO2: 30 mEq/L (ref 19–32)
CREATININE: 0.69 mg/dL (ref 0.40–1.20)
Chloride: 103 mEq/L (ref 96–112)
GFR: 87.41 mL/min (ref >60.00)
GLUCOSE: 79 mg/dL (ref 70–99)
Potassium: 3.7 mEq/L (ref 3.5–5.1)
Sodium: 140 mEq/L (ref 135–145)

## 2018-01-30 LAB — HEPATIC FUNCTION PANEL
ALK PHOS: 27 U/L — AB (ref 39–117)
ALT: 9 U/L (ref 0–35)
AST: 13 U/L (ref 0–37)
Albumin: 4 g/dL (ref 3.5–5.2)
BILIRUBIN DIRECT: 0.2 mg/dL (ref 0.0–0.3)
BILIRUBIN TOTAL: 0.9 mg/dL (ref 0.2–1.2)
Total Protein: 6.8 g/dL (ref 6.0–8.3)

## 2018-01-30 LAB — URINALYSIS, ROUTINE W REFLEX MICROSCOPIC
Bilirubin Urine: NEGATIVE
HGB URINE DIPSTICK: NEGATIVE
KETONES UR: NEGATIVE
LEUKOCYTES UA: NEGATIVE
NITRITE: NEGATIVE
RBC / HPF: NONE SEEN (ref 0–?)
Specific Gravity, Urine: <=1.005 — AB (ref 1.000–1.030)
TOTAL PROTEIN, URINE-UPE24: NEGATIVE
URINE GLUCOSE: NEGATIVE
UROBILINOGEN UA: 0.2 (ref 0.0–1.0)
WBC, UA: NONE SEEN (ref 0–?)
pH: 6 (ref 5.0–8.0)

## 2018-01-30 LAB — LIPID PANEL
Cholesterol: 186 mg/dL (ref 0–200)
HDL: 64.8 mg/dL (ref >39.00)
LDL Cholesterol: 97 mg/dL (ref 0–99)
NONHDL: 120.8
Total CHOL/HDL Ratio: 3
Triglycerides: 117 mg/dL (ref 0.0–149.0)
VLDL: 23.4 mg/dL (ref 0.0–40.0)

## 2018-01-30 LAB — TSH: TSH: 1.79 u[IU]/mL (ref 0.35–4.50)

## 2018-01-30 NOTE — Patient Instructions (Signed)

## 2018-01-30 NOTE — Progress Notes (Signed)
Subjective:    Patient ID: Madeline Mcdaniel, female    DOB: 03-27-1941, 77 y.o.   MRN: [id number]  HPI  Here to f/u; overall doing ok,  Pt denies chest pain, increasing sob or doe, wheezing, orthopnea, PND, increased LE swelling, palpitations, dizziness or syncope.  Pt denies new neurological symptoms such as new headache, or facial or extremity weakness or numbness.  Pt denies polydipsia, polyuria, or low sugar episode.  Pt states overall good compliance with meds, mostly trying to follow appropriate diet, with wt overall stable,  but little exercise however.  S/p PT per neuro for recent off balance and risk of falls.  Lost another 7 lbs, poor apetite, trying to eat more.   Wt Readings from Last 3 Encounters:  01/30/18 93 lb (42.2 kg)  07/31/17 96 lb (43.5 kg)  04/10/17 100 lb (45.4 kg)   Past Medical History:  Diagnosis Date  . Allergic rhinitis, cause unspecified 04/27/2011  . Anemia 04/27/2011  . Asthma 04/27/2011  . Bipolar affective disorder ([place]) 04/27/2011  . Cervical spondylosis 06/01/2012  . Chronic bronchitis 04/27/2011  . Chronic cystitis 04/27/2011  . Colon polyps 04/27/2011  . Degenerative joint disease 04/27/2011  . GERD (gastroesophageal reflux disease) 04/27/2011  . HTN (hypertension) 04/27/2011  . Hyperlipidemia 04/27/2011  . Mild cognitive impairment 04/12/2013  . Osteopenia 06/01/2012  . 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 Right carpal tunnel syndrome 07/22/2012  . TIA (transient ischemic attack) 04/27/2011  . Urinary incontinence 04/27/2011   Past Surgical History:  Procedure Laterality Date  . BLADDER SUSPENSION     A&P REPAIR WITH MESH  . BREAST LUMPECTOMY     SEVERAL TIMES  . BUNIONECTOMY     LEFT FOOT  . CHOLECYSTECTOMY    . COLONOSCOPY  48YRS AGO   IN [place],PT DOES NOT REMEMBER DOCTOR  . Lauro Regulus Bunionectomy Left 09/09/2014   @ [facility]  . NEURECTOMY FOOT Left 09/09/2014   @ [facility]  . ORTHOSCOPIC CARPETOMY     BOTH THUMBS  . POLYPECTOMY  10 YRS AGO   BENIGN  POLYPS X2  . STERIOTACTIC BREAST BIOPSY     RIGHT  . TENDON TRANSFER  12/09/2012   Procedure: TENDON TRANSFER;  Surgeon: Wynonia Sours, MD;  Location: [facility];  Service: Orthopedics;  Laterality: Left;  [place] PIP with FDS Slip Left ring finger, [place]  . TONSILLECTOMY    . TOTAL ABDOMINAL HYSTERECTOMY    . TRIGGER FINGER RELEASE     LEFT RING FINGER    reports that  has never smoked. she has never used smokeless tobacco. She reports that she does not drink alcohol or use drugs. family history includes Cancer (age of onset: 60) in her mother; Colon cancer in her paternal grandfather. Allergies  Allergen Reactions  . Aspirin Anaphylaxis  . Nsaids Anaphylaxis    BECAUSE OF REACTION TO VIOXX BECAUSE OF REACTION TO VIOXX  . Vioxx [Rofecoxib] Anaphylaxis  . Ciprofloxacin Other (See Comments)    HEADACHE HEADACHE   Current Outpatient Medications on File Prior to Visit  Medication Sig Dispense Refill  . acetaminophen (TYLENOL) 500 MG tablet Take 500 mg by mouth as needed.      Marland Kitchen alendronate (FOSAMAX) 70 MG tablet TAKE 1 TABLET(70 MG) BY MOUTH EVERY 7 DAYS WITH A FULL GLASS OF WATER AND ON AN EMPTY STOMACH 12 tablet 0  . CALCIUM PO Take by mouth as directed. Reported on 01/30/2016    . Coenzyme Q10 (CO Q 10  PO) Take 200 mg by mouth daily.      . Cranberry 500 MG CAPS Take by mouth 2 (two) times daily.    . Fluticasone-Salmeterol (ADVAIR DISKUS IN) Inhale 1 Inhaler into the lungs as needed. 100/50    . gabapentin (NEURONTIN) 300 MG capsule TAKE ONE CAPSULE BY MOUTH EVERY MORNING AND TAKE 2 CAPSULES EVERY EVENING 90 capsule 0  . guaiFENesin (MUCINEX) 600 MG 12 hr tablet Take 600 mg by mouth as directed.    . hydrochlorothiazide (HYDRODIURIL) 12.5 MG tablet TAKE 1 TABLET BY MOUTH DAILY 90 tablet 0  . nitrofurantoin (MACRODANTIN) 50 MG capsule TAKE 1 CAPSULE(50 MG) BY MOUTH DAILY 90 capsule 3  . PROAIR HFA 108 (90 BASE) MCG/ACT inhaler INHALE 2 PUFFS INTO THE LUNGS EVERY 6  HOURS AS NEEDED FOR WHEEZING OR SHORTNESS OF BREATH 8.5 g 1  . ranitidine (ZANTAC 75) 75 MG tablet Take 1 tablet (75 mg total) by mouth 2 (two) times daily. 60 tablet 2  . rivastigmine (EXELON) 1.5 MG capsule Take 1 capsule (1.5 mg total) by mouth 2 (two) times daily. 180 capsule 3  . VESICARE 5 MG tablet TAKE 1 TABLET(5 MG) BY MOUTH DAILY 90 tablet 0   No current facility-administered medications on file prior to visit.    Review of Systems  Constitutional: Negative for other unusual diaphoresis or sweats HENT: Negative for ear discharge or swelling Eyes: Negative for other worsening visual disturbances Respiratory: Negative for stridor or other swelling  Gastrointestinal: Negative for worsening distension or other blood Genitourinary: Negative for retention or other urinary change Musculoskeletal: Negative for other MSK pain or swelling Skin: Negative for color change or other new lesions Neurological: Negative for worsening tremors and other numbness  Psychiatric/Behavioral: Negative for worsening agitation or other fatigue All other system neg per pt    Objective:   Physical Exam BP 124/76   Pulse (!) 54   Temp 97.6 F (36.4 C) (Oral)   Ht 4\' 10"  (1.473 m)   Wt 93 lb (42.2 kg)   SpO2 96%   BMI 19.44 kg/m  VS noted,  Constitutional: Pt appears in NAD HENT: Head: NCAT.  Right Ear: External ear normal.  Left Ear: External ear normal.  Eyes: . Pupils are equal, round, and reactive to light. Conjunctivae and EOM are normal Nose: without d/c or deformity Neck: Neck supple. Gross normal ROM Cardiovascular: Normal rate and regular rhythm.   Pulmonary/Chest: Effort normal and breath sounds without rales or wheezing.  Abd:  Soft, NT, ND, + BS, no organomegaly Neurological: Pt is alert. At baseline orientation, motor grossly intact Skin: Skin is warm. No rashes, other new lesions, no LE edema Psychiatric: Pt behavior is normal without agitation  No other exam findings      Assessment & Plan:

## 2018-02-01 ENCOUNTER — Encounter: Payer: Self-pay | Admitting: Internal Medicine

## 2018-02-01 NOTE — Assessment & Plan Note (Signed)
stable overall by history and exam, recent data reviewed with pt, and pt to continue medical treatment as before,  to f/u any worsening symptoms or concerns. Lab Results  Component Value Date   LDLCALC 97 01/30/2018

## 2018-02-01 NOTE — Assessment & Plan Note (Signed)
stable overall by history and exam, recent data reviewed with pt, and pt to continue medical treatment as before,  to f/u any worsening symptoms or concerns BP Readings from Last 3 Encounters:  01/30/18 124/76  07/31/17 106/70  04/10/17 (!) 122/58

## 2018-02-01 NOTE — Assessment & Plan Note (Signed)
stable overall by history and exam, and pt to continue medical treatment as before,  to f/u any worsening symptoms or concerns 

## 2018-02-03 ENCOUNTER — Ambulatory Visit: Payer: Medicare Other | Admitting: Psychology

## 2018-02-03 ENCOUNTER — Encounter: Payer: Self-pay | Admitting: Psychology

## 2018-02-03 ENCOUNTER — Ambulatory Visit (INDEPENDENT_AMBULATORY_CARE_PROVIDER_SITE_OTHER): Payer: Medicare Other | Admitting: Psychology

## 2018-02-03 DIAGNOSIS — F3170 Bipolar disorder, currently in remission, most recent episode unspecified: Secondary | ICD-10-CM

## 2018-02-03 DIAGNOSIS — R413 Other amnesia: Secondary | ICD-10-CM | POA: Diagnosis not present

## 2018-02-03 NOTE — Progress Notes (Signed)
   Neuropsychology Note  Madeline Mcdaniel completed 60 minutes of neuropsychological testing with technician, Madeline Mcdaniel, BS, under the supervision of Dr. Macarthur Critchley, Licensed Psychologist. The patient did not appear overtly distressed by the testing session, per behavioral observation or via self-report to the technician. Rest breaks were offered.   Clinical Decision Making: In considering the patient's current level of functioning, level of presumed impairment, nature of symptoms, emotional and behavioral responses during the interview, level of literacy, and observed level of motivation/effort, a battery of tests was selected and communicated to the psychometrician.  Communication between the psychologist and technician was ongoing throughout the testing session and changes were made as deemed necessary based on patient performance on testing, technician observations and additional pertinent factors such as those listed above.  Madeline Mcdaniel will return within approximately 2 weeks for an interactive feedback session with Dr. Si Raider at which time her test performances, clinical impressions and treatment recommendations will be reviewed in detail. The patient understands she can contact our office should she require our assistance before this time.  35 minutes spent performing neuropsychological evaluation services/clinical decision making (psychologist). [CPT 49179] 60 minutes spent face-to-face with patient administering standardized tests, 30 minutes spent scoring (technician). [CPT Y8200648, 15056]  Full report to follow.
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
## 2018-02-11 ENCOUNTER — Other Ambulatory Visit: Payer: Self-pay | Admitting: Internal Medicine

## 2018-02-23 ENCOUNTER — Other Ambulatory Visit: Payer: Self-pay | Admitting: Internal Medicine
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
## 2018-03-06 ENCOUNTER — Ambulatory Visit (INDEPENDENT_AMBULATORY_CARE_PROVIDER_SITE_OTHER): Payer: Medicare Other | Admitting: Psychology

## 2018-03-06 ENCOUNTER — Encounter: Payer: Self-pay | Admitting: Psychology

## 2018-03-06 DIAGNOSIS — F0390 Unspecified dementia without behavioral disturbance: Secondary | ICD-10-CM

## 2018-03-06 NOTE — Patient Instructions (Addendum)
Mild dementia characterized by predominantly executive dysfunction (difficulties with abstract reasoning, mental flexibility/set-shifting, word retrieval) and variable memory. Mild dementia with this cognitive profile is more common in individuals with a history of bipolar disorder. I don't see clear evidence that this is Alzheimer's disease.  1. Optimal control of vascular risk factors (high blood pressure, high cholesterol) is important for brain health. Safe cardiovascular exercise on a regular basis, in addition to a heart healthy diet, is recommended.  2. Having a regular schedule/routine helps maximize cognitive functioning and engagement in life. 3. Due to the type of cognitive difficulties you are having, it is recommended that you discontinue driving. This is to help ensure your safety and the safety of others. 4. If you are going to be doing any cooking, it is recommended you set timers/reminders. These are also helpful for reminding you to take medications.

## 2018-03-18 ENCOUNTER — Encounter: Payer: Medicare Other | Admitting: Psychology

## 2018-03-29 ENCOUNTER — Other Ambulatory Visit: Payer: Self-pay | Admitting: Internal Medicine

## 2018-04-10 ENCOUNTER — Other Ambulatory Visit: Payer: Self-pay | Admitting: Internal Medicine

## 2018-04-22 ENCOUNTER — Encounter: Payer: Self-pay | Admitting: Neurology

## 2018-04-22 ENCOUNTER — Other Ambulatory Visit: Payer: Self-pay

## 2018-04-22 ENCOUNTER — Ambulatory Visit (INDEPENDENT_AMBULATORY_CARE_PROVIDER_SITE_OTHER): Payer: Medicare Other | Admitting: Neurology

## 2018-04-22 VITALS — BP 118/62 | HR 63 | Ht <= 58 in | Wt 91.0 lb

## 2018-04-22 DIAGNOSIS — F0390 Unspecified dementia without behavioral disturbance: Secondary | ICD-10-CM

## 2018-04-22 MED ORDER — RIVASTIGMINE TARTRATE 1.5 MG PO CAPS
1.5000 mg | ORAL_CAPSULE | Freq: Two times a day (BID) | ORAL | 3 refills | Status: DC
Start: 2018-04-22 — End: 2018-11-19
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
## 2018-04-22 NOTE — Patient Instructions (Signed)
1. Continue Exelon 1.5mg  twice a day 2. Would have your husband start checking behind with the medications and doing the driving 3. Follow-up in 6 months, call for any changes  FALL PRECAUTIONS: Be cautious when walking. Scan the area for obstacles that may increase the risk of trips and falls. When getting up in the mornings, sit up at the edge of the bed for a few minutes before getting out of bed. Consider elevating the bed at the head end to avoid drop of blood pressure when getting up. Walk always in a well-lit room (use night lights in the walls). Avoid area rugs or power cords from appliances in the middle of the walkways. Use a walker or a cane if necessary and consider physical therapy for balance exercise. Get your eyesight checked regularly.  FINANCIAL OVERSIGHT: Supervision, especially oversight when making financial decisions or transactions is also recommended.  HOME SAFETY: Consider the safety of the kitchen when operating appliances like stoves, microwave oven, and blender. Consider having supervision and share cooking responsibilities until no longer able to participate in those. Accidents with firearms and other hazards in the house should be identified and addressed as well.  DRIVING: Regarding driving, in patients with progressive memory problems, driving will be impaired. We advise to have someone else do the driving if trouble finding directions or if minor accidents are reported. Independent driving assessment is available to determine safety of driving.  ABILITY TO BE LEFT ALONE: If patient is unable to contact 911 operator, consider using LifeLine, or when the need is there, arrange for someone to stay with patients. Smoking is a fire hazard, consider supervision or cessation. Risk of wandering should be assessed by caregiver and if detected at any point, supervision and safe proof recommendations should be instituted.  MEDICATION SUPERVISION: Inability to self-administer  medication needs to be constantly addressed. Implement a mechanism to ensure safe administration of the medications.  RECOMMENDATIONS FOR ALL PATIENTS WITH MEMORY PROBLEMS: 1. Continue to exercise (Recommend 30 minutes of walking everyday, or 3 hours every week) 2. Increase social interactions - continue going to [place] and enjoy social gatherings with friends and family 3. Eat healthy, avoid fried foods and eat more fruits and vegetables 4. Maintain adequate blood pressure, blood sugar, and blood cholesterol level. Reducing the risk of stroke and cardiovascular disease also helps promoting better memory. 5. Avoid stressful situations. Live a simple life and avoid aggravations. Organize your time and prepare for the next day in anticipation. 6. Sleep well, avoid any interruptions of sleep and avoid any distractions in the bedroom that may interfere with adequate sleep quality 7. Avoid sugar, avoid sweets as there is a strong link between excessive sugar intake, diabetes, and cognitive impairment We discussed the Mediterranean diet, which has been shown to help patients reduce the risk of progressive memory disorders and reduces cardiovascular risk. This includes eating fish, eat fruits and green leafy vegetables, nuts like almonds and hazelnuts, walnuts, and also use olive oil. Avoid fast foods and fried foods as much as possible. Avoid sweets and sugar as sugar use has been linked to worsening of memory function.  There is always a concern of gradual progression of memory problems. If this is the case, then we may need to adjust level of care according to patient needs. Support, both to the patient and caregiver, should then be put into place.

## 2018-05-07 ENCOUNTER — Other Ambulatory Visit: Payer: Self-pay | Admitting: Internal Medicine

## 2018-05-16 ENCOUNTER — Other Ambulatory Visit: Payer: Self-pay | Admitting: Internal Medicine

## 2018-07-08 ENCOUNTER — Other Ambulatory Visit: Payer: Self-pay | Admitting: Internal Medicine

## 2018-07-16 ENCOUNTER — Other Ambulatory Visit: Payer: Self-pay | Admitting: Internal Medicine

## 2018-07-16 DIAGNOSIS — Z1231 Encounter for screening mammogram for malignant neoplasm of breast: Secondary | ICD-10-CM

## 2018-08-05 ENCOUNTER — Other Ambulatory Visit: Payer: Self-pay | Admitting: Internal Medicine

## 2018-08-06 ENCOUNTER — Other Ambulatory Visit: Payer: Self-pay | Admitting: Internal Medicine

## 2018-08-07 DIAGNOSIS — Z23 Encounter for immunization: Secondary | ICD-10-CM | POA: Diagnosis not present

## 2018-08-28 ENCOUNTER — Ambulatory Visit
Admission: RE | Admit: 2018-08-28 | Discharge: 2018-08-28 | Disposition: A | Discharge status: Home / Self Care | Payer: Medicare Other | Source: Ambulatory Visit | Attending: Internal Medicine | Admitting: Internal Medicine

## 2018-08-28 DIAGNOSIS — Z1231 Encounter for screening mammogram for malignant neoplasm of breast: Secondary | ICD-10-CM | POA: Diagnosis not present

## 2018-10-02 ENCOUNTER — Other Ambulatory Visit: Payer: Self-pay | Admitting: Internal Medicine

## 2018-10-26 ENCOUNTER — Other Ambulatory Visit: Payer: Self-pay | Admitting: Internal Medicine

## 2018-10-29 DIAGNOSIS — N3941 Urge incontinence: Secondary | ICD-10-CM | POA: Diagnosis not present

## 2018-10-29 DIAGNOSIS — Z87440 Personal history of urinary (tract) infections: Secondary | ICD-10-CM | POA: Diagnosis not present

## 2018-11-19 ENCOUNTER — Encounter: Payer: Self-pay | Admitting: Neurology

## 2018-11-19 ENCOUNTER — Ambulatory Visit (INDEPENDENT_AMBULATORY_CARE_PROVIDER_SITE_OTHER): Payer: Medicare Other | Admitting: Neurology

## 2018-11-19 VITALS — BP 104/60 | HR 64 | Ht <= 58 in | Wt 97.0 lb

## 2018-11-19 DIAGNOSIS — F0390 Unspecified dementia without behavioral disturbance: Secondary | ICD-10-CM | POA: Diagnosis not present

## 2018-11-19 DIAGNOSIS — F3170 Bipolar disorder, currently in remission, most recent episode unspecified: Secondary | ICD-10-CM

## 2018-11-19 MED ORDER — RIVASTIGMINE TARTRATE 1.5 MG PO CAPS: 1.5000 mg | ORAL_CAPSULE | Freq: Two times a day (BID) | ORAL | 3 refills | Status: DC

## 2018-11-19 MED ORDER — MEMANTINE HCL 10 MG PO TABS: ORAL_TABLET | ORAL | 3 refills | Status: DC
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
## 2018-11-19 NOTE — Patient Instructions (Addendum)
1. Start Memantine 10mg : Take 1 tablet every night for 2 weeks, then increase to 1 tablet twice a day  2. Continue rivastigmine 1.5mg  twice a day  3. Follow-up in 6 months, call for any changes  FALL PRECAUTIONS: Be cautious when walking. Scan the area for obstacles that may increase the risk of trips and falls. When getting up in the mornings, sit up at the edge of the bed for a few minutes before getting out of bed. Consider elevating the bed at the head end to avoid drop of blood pressure when getting up. Walk always in a well-lit room (use night lights in the walls). Avoid area rugs or power cords from appliances in the middle of the walkways. Use a walker or a cane if necessary and consider physical therapy for balance exercise. Get your eyesight checked regularly.  FINANCIAL OVERSIGHT: Supervision, especially oversight when making financial decisions or transactions is also recommended.  HOME SAFETY: Consider the safety of the kitchen when operating appliances like stoves, microwave oven, and blender. Consider having supervision and share cooking responsibilities until no longer able to participate in those. Accidents with firearms and other hazards in the house should be identified and addressed as well.  ABILITY TO BE LEFT ALONE: If patient is unable to contact 911 operator, consider using LifeLine, or when the need is there, arrange for someone to stay with patients. Smoking is a fire hazard, consider supervision or cessation. Risk of wandering should be assessed by caregiver and if detected at any point, supervision and safe proof recommendations should be instituted.  MEDICATION SUPERVISION: Inability to self-administer medication needs to be constantly addressed. Implement a mechanism to ensure safe administration of the medications.  RECOMMENDATIONS FOR ALL PATIENTS WITH MEMORY PROBLEMS: 1. Continue to exercise (Recommend 30 minutes of walking everyday, or 3 hours every week) 2.  Increase social interactions - continue going to [place] and enjoy social gatherings with friends and family 3. Eat healthy, avoid fried foods and eat more fruits and vegetables 4. Maintain adequate blood pressure, blood sugar, and blood cholesterol level. Reducing the risk of stroke and cardiovascular disease also helps promoting better memory. 5. Avoid stressful situations. Live a simple life and avoid aggravations. Organize your time and prepare for the next day in anticipation. 6. Sleep well, avoid any interruptions of sleep and avoid any distractions in the bedroom that may interfere with adequate sleep quality 7. Avoid sugar, avoid sweets as there is a strong link between excessive sugar intake, diabetes, and cognitive impairment The Mediterranean diet has been shown to help patients reduce the risk of progressive memory disorders and reduces cardiovascular risk. This includes eating fish, eat fruits and green leafy vegetables, nuts like almonds and hazelnuts, walnuts, and also use olive oil. Avoid fast foods and fried foods as much as possible. Avoid sweets and sugar as sugar use has been linked to worsening of memory function.  There is always a concern of gradual progression of memory problems. If this is the case, then we may need to adjust level of care according to patient needs. Support, both to the patient and caregiver, should then be put into place.

## 2018-12-22 ENCOUNTER — Encounter: Payer: Self-pay | Admitting: Family Medicine

## 2018-12-22 ENCOUNTER — Ambulatory Visit (INDEPENDENT_AMBULATORY_CARE_PROVIDER_SITE_OTHER): Payer: Medicare Other | Admitting: Family Medicine

## 2018-12-22 ENCOUNTER — Ambulatory Visit (INDEPENDENT_AMBULATORY_CARE_PROVIDER_SITE_OTHER)
Admission: RE | Admit: 2018-12-22 | Discharge: 2018-12-22 | Disposition: A | Discharge status: Home / Self Care | Payer: Medicare Other | Source: Ambulatory Visit | Attending: Family Medicine | Admitting: Family Medicine

## 2018-12-22 VITALS — BP 140/84 | HR 59 | Ht <= 58 in | Wt 97.0 lb

## 2018-12-22 DIAGNOSIS — G8929 Other chronic pain: Secondary | ICD-10-CM

## 2018-12-22 DIAGNOSIS — M533 Sacrococcygeal disorders, not elsewhere classified: Secondary | ICD-10-CM

## 2018-12-22 DIAGNOSIS — M79605 Pain in left leg: Secondary | ICD-10-CM | POA: Diagnosis not present

## 2018-12-22 DIAGNOSIS — M25552 Pain in left hip: Secondary | ICD-10-CM

## 2018-12-22 DIAGNOSIS — M545 Low back pain: Secondary | ICD-10-CM | POA: Diagnosis not present

## 2018-12-22 MED ORDER — VITAMIN D (ERGOCALCIFEROL) 1.25 MG (50000 UNIT) PO CAPS: 50000.0000 [IU] | ORAL_CAPSULE | ORAL | 0 refills | Status: DC

## 2018-12-22 NOTE — Assessment & Plan Note (Signed)
Possible radiculopathy.  Could be contributing to some of the aches and pains.  Discussed icing regimen and home exercises.  Follow-up again in 4 to 8 weeks

## 2018-12-22 NOTE — Progress Notes (Signed)
[facility] [address] [place], [place] [address] Phone: [phone] Subjective:    I Madeline Mcdaniel am serving as a Education administrator for Dr. Hulan Mcdaniel.   I'm seeing this patient by the request  of:  Madeline Borg, MD   CC: Left hip pain  NTZ:[id number]  Madeline Mcdaniel is a 78 y.o. female coming in with complaint of left hip pain. States that she fell twice. Every step is painful. Pain radiates down the left leg. Would like print out prescriptions.    Location - SI joint pain  Character- Sharp Aggravating factors- Walking Reliving factors- Ice, heat, tylenol  Severity-9 out of 10 symptoms are more concerned because it is affecting daily activities     Past Medical History:  Diagnosis Date  . Allergic rhinitis, cause unspecified 04/27/2011  . Anemia 04/27/2011  . Asthma 04/27/2011  . Bipolar affective disorder ([place]) 04/27/2011  . Cervical spondylosis 06/01/2012  . Chronic bronchitis 04/27/2011  . Chronic cystitis 04/27/2011  . Colon polyps 04/27/2011  . Degenerative joint disease 04/27/2011  . GERD (gastroesophageal reflux disease) 04/27/2011  . HTN (hypertension) 04/27/2011  . Hyperlipidemia 04/27/2011  . Mild cognitive impairment 04/12/2013  . Osteopenia 06/01/2012  . 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 Right carpal tunnel syndrome 07/22/2012  . TIA (transient ischemic attack) 04/27/2011  . Urinary incontinence 04/27/2011   Past Surgical History:  Procedure Laterality Date  . BLADDER SUSPENSION     A&P REPAIR WITH MESH  . BREAST LUMPECTOMY     SEVERAL TIMES  . BUNIONECTOMY     LEFT FOOT  . CHOLECYSTECTOMY    . COLONOSCOPY  26YRS AGO   IN [place],PT DOES NOT REMEMBER DOCTOR  . Madeline Mcdaniel Bunionectomy Left 09/09/2014   @ [facility]  . NEURECTOMY FOOT Left 09/09/2014   @ [place]  . ORTHOSCOPIC CARPETOMY     BOTH THUMBS  . POLYPECTOMY  10 YRS AGO   BENIGN POLYPS X2  . STERIOTACTIC BREAST BIOPSY     RIGHT  . TENDON TRANSFER  12/09/2012   Procedure:  TENDON TRANSFER;  Surgeon: Madeline Sours, MD;  Location: [facility];  Service: Orthopedics;  Laterality: Left;  Tenodesis PIP with FDS Slip Left ring finger, [place]  . TONSILLECTOMY    . TOTAL ABDOMINAL HYSTERECTOMY    . TRIGGER FINGER RELEASE     LEFT RING FINGER   Social History   Socioeconomic History  . Marital status: Married    Spouse name: Not on file  . Number of children: 2  . Years of education: Not on file  . Highest education level: Not on file  Occupational History  . Not on file  Social Needs  . Financial resource strain: Not on file  . Food insecurity:    Worry: Not on file    Inability: Not on file  . Transportation needs:    Medical: Not on file    Non-medical: Not on file  Tobacco Use  . Smoking status: Never Smoker  . Smokeless tobacco: Never Used  Substance and Sexual Activity  . Alcohol use: No  . Drug use: No  . Sexual activity: Not Currently  Lifestyle  . Physical activity:    Days per week: Not on file    Minutes per session: Not on file  . Stress: Not on file  Relationships  . Social connections:    Talks on phone: Not on file    Gets together: Not on file  Attends religious service: Not on file    Active member of club or organization: Not on file    Attends meetings of clubs or organizations: Not on file    Relationship status: Not on file  Other Topics Concern  . Not on file  Social History Narrative  . Not on file   Allergies  Allergen Reactions  . Aspirin Anaphylaxis  . Nsaids Anaphylaxis    BECAUSE OF REACTION TO VIOXX BECAUSE OF REACTION TO VIOXX  . Vioxx [Rofecoxib] Anaphylaxis  . Ciprofloxacin Other (See Comments)    HEADACHE HEADACHE   Family History  Problem Relation Age of Onset  . Cancer Mother 83       RENAL CELL CARCINOMA  . Colon cancer Paternal Grandfather     Current Outpatient Medications (Endocrine & Metabolic):  .  alendronate (FOSAMAX) 70 MG tablet, TAKE 1 TABLET(70 MG) BY MOUTH  EVERY 7 DAYS WITH A FULL GLASS OF WATER AND ON AN EMPTY STOMACH  Current Outpatient Medications (Cardiovascular):  .  hydrochlorothiazide (HYDRODIURIL) 12.5 MG tablet, TAKE 1 TABLET BY MOUTH DAILY  Current Outpatient Medications (Respiratory):  Marland Kitchen  Fluticasone-Salmeterol (ADVAIR DISKUS IN), Inhale 1 Inhaler into the lungs as needed. 100/50  Current Outpatient Medications (Analgesics):  .  acetaminophen (TYLENOL) 500 MG tablet, Take 500 mg by mouth as needed.     Current Outpatient Medications (Other):  Marland Kitchen  CALCIUM PO, Take by mouth as directed. Reported on 01/30/2016 .  Coenzyme Q10 (CO Q 10 PO), Take 200 mg by mouth daily.   .  Cranberry 500 MG CAPS, Take by mouth 2 (two) times daily. Marland Kitchen  gabapentin (NEURONTIN) 300 MG capsule, TAKE ONE CAPSULE BY MOUTH EVERY MORNING AND TAKE 2 CAPSULES EVERY EVENING .  memantine (NAMENDA) 10 MG tablet, Take 1 tablet every night for 2 weeks, then increase to 1 tablet twice a day .  nitrofurantoin (MACRODANTIN) 50 MG capsule, TAKE 1 CAPSULE(50 MG) BY MOUTH DAILY .  rivastigmine (EXELON) 1.5 MG capsule, Take 1 capsule (1.5 mg total) by mouth 2 (two) times daily. .  VESICARE 5 MG tablet, TAKE 1 TABLET(5 MG) BY MOUTH DAILY .  Vitamin D, Ergocalciferol, (DRISDOL) 1.25 MG (50000 UT) CAPS capsule, Take 1 capsule (50,000 Units total) by mouth every 7 (seven) days.    Past medical history, social, surgical and family history all reviewed in electronic medical record.  No pertanent information unless stated regarding to the chief complaint.   Review of Systems:  No headache, visual changes, nausea, vomiting, diarrhea, constipation, dizziness, abdominal pain, skin rash, fevers, chills, night sweats, weight loss, swollen lymph nodes, body aches, joint swelling,  chest pain, shortness of breath, mood changes.  Positive muscle aches  Objective  Blood pressure 140/84, pulse (!) 59, height 4\' 9"  (1.448 m), weight 97 lb (44 kg), SpO2 94 %.    General: No apparent  distress alert and oriented x3 mood and affect normal, dressed appropriately.  Patient is some mild flat affect HEENT: Pupils equal, extraocular movements intact  Respiratory: Patient's speak in full sentences and does not appear short of breath  Cardiovascular: Trace lower extremity edema, non tender, no erythema  Skin: Warm dry intact with no signs of infection or rash on extremities or on axial skeleton.  Abdomen: Soft nontender  Neuro: Cranial nerves II through XII are intact, neurovascularly intact in all extremities with 2+ DTRs and 2+ pulses.  Lymph: No lymphadenopathy of posterior or anterior cervical chain or axillae bilaterally.  Gait antalgic MSK:  tender with limited range of motion and stability and strength and tone of shoulders, elbows, wrist,  knee and ankles bilaterally.   Low back exam has loss of lordosis.  Severe tenderness over the sacroiliac.  Positive Corky Sox.  Significant tightness.  Left side mild straight leg test.  Difficult with Corky Sox secondary to pain and some mild weakness Positive Trendelenburg  Hip: Left ROM IR: 25 Deg, ER: 35 Deg, Flexion: 120 Deg, Extension: 100 Deg, Abduction: 25 Deg, Adduction: 15 Deg Strength IR: 4/5, ER: 5/5, Flexion: 5/5, Extension: 4/5, Abduction: 5/5, Adduction: 3/5 Pelvic alignment unremarkable to inspection and palpation. Standing hip rotation and gait without trendelenburg sign / unsteadiness. Tenderness to palpation of the lateral aspect of the hip.. Severe tenderness over the left sacroiliac joint    97110; 15 additional minutes spent for Therapeutic exercises as stated in above notes.  This included exercises focusing on stretching, strengthening, with significant focus on eccentric aspects.   Long term goals include an improvement in range of motion, strength, endurance as well as avoiding reinjury. Patient's frequency would include in 1-2 times a day, 3-5 times a week for a duration of 6-12 weeks. Low back exercises that  included:  Pelvic tilt/bracing instruction to focus on control of the pelvic girdle and lower abdominal muscles  Glute strengthening exercises, focusing on proper firing of the glutes without engaging the low back muscles Proper stretching techniques for maximum relief for the hamstrings, hip flexors, low back and some rotation where tolerated    Proper technique shown and discussed handout in great detail with ATC.  All questions were discussed and answered.   Impression and Recommendations:     This case required medical decision making of moderate complexity. The above documentation has been reviewed and is accurate and complete Lyndal Pulley, DO       Note: This dictation was prepared with Dragon dictation along with smaller phrase technology. Any transcriptional errors that result from this process are unintentional.

## 2018-12-22 NOTE — Assessment & Plan Note (Signed)
Left hip more than the sacroiliac joint.  Patient does have pain.  Could be an occult fracture.  X-rays of the hip and back ordered today.  Discussed icing regimen patient will do vitamin D supplementation, range of motion exercises given.  Worsening pain we will need advanced imaging which would either be a CT or MRI and will discuss with patient further in the next 2 weeks

## 2018-12-22 NOTE — Patient Instructions (Addendum)
Good to see you  Ice is your friend Mild range of motion exercises for the hip  Only do the bike or seated elliptical for now  Once weekly vitamin D at your pharmacy  pennsaid pinkie amount topically 2 times daily as needed.  See me again in 2 weeks and if not better we will discuss MRI or CT scan

## 2019-01-05 NOTE — Progress Notes (Signed)
[facility] [address] [place], [place] [address] Phone: [phone] Subjective:    I'm seeing this patient by the request  of:    CC: Back pain follow-up  [id number]   12/22/2018:  Possible radiculopathy.  Could be contributing to some of the aches and pains.  Left hip more than the sacroiliac joint.    Update 01/06/2019:  Madeline Mcdaniel is a 78 y.o. female coming in with complaint of back pain. Patient states that she has sharp pain with walking or moving her legs. If lying or sitting her pain decreases. Pain radiates down the left leg to the lateral malleolus.  Patient states if anything seems to be worsening.  When she said she seems to do well but any type of weightbearing causes more pain.  Has even woken her up at night.   Patient did have x-rays taken on January 20 of 2020.  These were completely visualized by me.  Found to have some mild degenerative changes but does have osseous demineralization.  This is of the lumbar spine x-rays of the hip were unremarkable.  Past Medical History:  Diagnosis Date  . Allergic rhinitis, cause unspecified 04/27/2011  . Anemia 04/27/2011  . Asthma 04/27/2011  . Bipolar affective disorder ([place]) 04/27/2011  . Cervical spondylosis 06/01/2012  . Chronic bronchitis 04/27/2011  . Chronic cystitis 04/27/2011  . Colon polyps 04/27/2011  . Degenerative joint disease 04/27/2011  . GERD (gastroesophageal reflux disease) 04/27/2011  . HTN (hypertension) 04/27/2011  . Hyperlipidemia 04/27/2011  . Mild cognitive impairment 04/12/2013  . Osteopenia 06/01/2012  . 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 Right carpal tunnel syndrome 07/22/2012  . TIA (transient ischemic attack) 04/27/2011  . Urinary incontinence 04/27/2011   Past Surgical History:  Procedure Laterality Date  . BLADDER SUSPENSION     A&P REPAIR WITH MESH  . BREAST LUMPECTOMY     SEVERAL TIMES  . BUNIONECTOMY     LEFT FOOT  . CHOLECYSTECTOMY    . COLONOSCOPY  55YRS AGO   IN  [place],PT DOES NOT REMEMBER DOCTOR  . Lauro Regulus Bunionectomy Left 09/09/2014   @ [facility]  . NEURECTOMY FOOT Left 09/09/2014   @ [facility]  . ORTHOSCOPIC CARPETOMY     BOTH THUMBS  . POLYPECTOMY  10 YRS AGO   BENIGN POLYPS X2  . STERIOTACTIC BREAST BIOPSY     RIGHT  . TENDON TRANSFER  12/09/2012   Procedure: TENDON TRANSFER;  Surgeon: Wynonia Sours, MD;  Location: [facility];  Service: Orthopedics;  Laterality: Left;  Tenodesis PIP with FDS Slip Left ring finger, [place]  . TONSILLECTOMY    . TOTAL ABDOMINAL HYSTERECTOMY    . TRIGGER FINGER RELEASE     LEFT RING FINGER   Social History   Socioeconomic History  . Marital status: Married    Spouse name: Not on file  . Number of children: 2  . Years of education: Not on file  . Highest education level: Not on file  Occupational History  . Not on file  Social Needs  . Financial resource strain: Not on file  . Food insecurity:    Worry: Not on file    Inability: Not on file  . Transportation needs:    Medical: Not on file    Non-medical: Not on file  Tobacco Use  . Smoking status: Never Smoker  . Smokeless tobacco: Never Used  Substance and Sexual Activity  . Alcohol use: No  .  Drug use: No  . Sexual activity: Not Currently  Lifestyle  . Physical activity:    Days per week: Not on file    Minutes per session: Not on file  . Stress: Not on file  Relationships  . Social connections:    Talks on phone: Not on file    Gets together: Not on file    Attends religious service: Not on file    Active member of club or organization: Not on file    Attends meetings of clubs or organizations: Not on file    Relationship status: Not on file  Other Topics Concern  . Not on file  Social History Narrative  . Not on file   Allergies  Allergen Reactions  . Aspirin Anaphylaxis  . Nsaids Anaphylaxis    BECAUSE OF REACTION TO VIOXX BECAUSE OF REACTION TO VIOXX  . Vioxx [Rofecoxib] Anaphylaxis  .  Ciprofloxacin Other (See Comments)    HEADACHE HEADACHE   Family History  Problem Relation Age of Onset  . Cancer Mother 41       RENAL CELL CARCINOMA  . Colon cancer Paternal Grandfather     Current Outpatient Medications (Endocrine & Metabolic):  .  alendronate (FOSAMAX) 70 MG tablet, TAKE 1 TABLET(70 MG) BY MOUTH EVERY 7 DAYS WITH A FULL GLASS OF WATER AND ON AN EMPTY STOMACH  Current Outpatient Medications (Cardiovascular):  .  hydrochlorothiazide (HYDRODIURIL) 12.5 MG tablet, TAKE 1 TABLET BY MOUTH DAILY  Current Outpatient Medications (Respiratory):  Marland Kitchen  Fluticasone-Salmeterol (ADVAIR DISKUS IN), Inhale 1 Inhaler into the lungs as needed. 100/50  Current Outpatient Medications (Analgesics):  .  acetaminophen (TYLENOL) 500 MG tablet, Take 500 mg by mouth as needed.     Current Outpatient Medications (Other):  Marland Kitchen  CALCIUM PO, Take by mouth as directed. Reported on 01/30/2016 .  Coenzyme Q10 (CO Q 10 PO), Take 200 mg by mouth daily.   .  Cranberry 500 MG CAPS, Take by mouth 2 (two) times daily. Marland Kitchen  gabapentin (NEURONTIN) 300 MG capsule, TAKE ONE CAPSULE BY MOUTH EVERY MORNING AND TAKE 2 CAPSULES EVERY EVENING .  memantine (NAMENDA) 10 MG tablet, Take 1 tablet every night for 2 weeks, then increase to 1 tablet twice a day .  nitrofurantoin (MACRODANTIN) 50 MG capsule, TAKE 1 CAPSULE(50 MG) BY MOUTH DAILY .  rivastigmine (EXELON) 1.5 MG capsule, Take 1 capsule (1.5 mg total) by mouth 2 (two) times daily. .  VESICARE 5 MG tablet, TAKE 1 TABLET(5 MG) BY MOUTH DAILY .  Vitamin D, Ergocalciferol, (DRISDOL) 1.25 MG (50000 UT) CAPS capsule, Take 1 capsule (50,000 Units total) by mouth every 7 (seven) days.    Past medical history, social, surgical and family history all reviewed in electronic medical record.  No pertanent information unless stated regarding to the chief complaint.   Review of Systems:  No headache, visual changes, nausea, vomiting, diarrhea, constipation, dizziness,  abdominal pain, skin rash, fevers, chills, night sweats, weight loss, swollen lymph nodes, body aches, joint swelling,  chest pain, shortness of breath, mood changes.  Positive muscle aches  Objective  Blood pressure 124/72, pulse 78, height 4\' 9"  (1.448 m), weight 98 lb (44.5 kg), SpO2 96 %.    General: No apparent distress alert and mild confusion at baseline HEENT: Pupils equal, extraocular movements intact  Respiratory: Patient's speak in full sentences and does not appear short of breath  Cardiovascular: Trace lower extremity edema, non tender, no erythema  Skin: Warm dry intact with no  signs of infection or rash on extremities or on axial skeleton.  Abdomen: Soft nontender  Neuro: Cranial nerves II through XII are intact, neurovascularly intact in all extremities with 2+ DTRs and 2+ pulses.  Lymph: No lymphadenopathy of posterior or anterior cervical chain or axillae bilaterally.  Gait antalgic MSK: Mild tender with limited range of motion and good stability and symmetric strength and tone of shoulders, elbows, wrist, hip, knee and ankles bilaterally.  Patient back exam also has severe tenderness to palpation to the paraspinal musculature left greater than right.  Positive straight leg test in the L5 radicular symptoms with mild weakness with plantarflexion of the foot noted.  Patient has a mild positive Corky Sox.    Impression and Recommendations:     This case required medical decision making of moderate complexity. The above documentation has been reviewed and is accurate and complete Lyndal Pulley, DO       Note: This dictation was prepared with Dragon dictation along with smaller phrase technology. Any transcriptional errors that result from this process are unintentional.

## 2019-01-06 ENCOUNTER — Encounter: Payer: Self-pay | Admitting: Family Medicine

## 2019-01-06 ENCOUNTER — Ambulatory Visit: Payer: Medicare Other | Admitting: Family Medicine

## 2019-01-06 ENCOUNTER — Ambulatory Visit (INDEPENDENT_AMBULATORY_CARE_PROVIDER_SITE_OTHER): Payer: Medicare Other | Admitting: Family Medicine

## 2019-01-06 VITALS — BP 124/72 | HR 78 | Ht <= 58 in | Wt 98.0 lb

## 2019-01-06 DIAGNOSIS — M5416 Radiculopathy, lumbar region: Secondary | ICD-10-CM

## 2019-01-06 NOTE — Assessment & Plan Note (Signed)
Worsening pain overall.  Radicular symptoms down.  Patient has some mild weakness even with plantarflexion.  Concern for an L5 left-sided nerve root impingement.  Could be a herniated disc for potential occult fracture secondary to patient's poor bone density.  At this time I do feel advanced imaging including an MRI would be beneficial.  Patient as well as husband agree with the plan.  Could be candidate for epidural steroid injections for patient would of course want to avoid any surgical intervention.  Continue the gabapentin for pain relief follow-up with me after the imaging.  Spent  25 minutes with patient face-to-face and had greater than 50% of counseling including as described above in assessment and plan.

## 2019-01-06 NOTE — Patient Instructions (Signed)
Good to see you  I am sorry not better  We will get MRI to further evaluate the pain  Call [phone] to schedule the MRI  I will write you with results  Then discuss next steps

## 2019-01-12 ENCOUNTER — Encounter: Payer: Self-pay | Admitting: Internal Medicine

## 2019-01-12 ENCOUNTER — Other Ambulatory Visit (INDEPENDENT_AMBULATORY_CARE_PROVIDER_SITE_OTHER): Payer: Medicare Other

## 2019-01-12 ENCOUNTER — Ambulatory Visit (INDEPENDENT_AMBULATORY_CARE_PROVIDER_SITE_OTHER): Payer: Medicare Other | Admitting: Internal Medicine

## 2019-01-12 VITALS — BP 126/82 | HR 56 | Temp 98.0°F | Ht <= 58 in | Wt 97.0 lb

## 2019-01-12 DIAGNOSIS — D649 Anemia, unspecified: Secondary | ICD-10-CM | POA: Diagnosis not present

## 2019-01-12 DIAGNOSIS — I10 Essential (primary) hypertension: Secondary | ICD-10-CM | POA: Diagnosis not present

## 2019-01-12 DIAGNOSIS — E785 Hyperlipidemia, unspecified: Secondary | ICD-10-CM | POA: Diagnosis not present

## 2019-01-12 DIAGNOSIS — E559 Vitamin D deficiency, unspecified: Secondary | ICD-10-CM | POA: Diagnosis not present

## 2019-01-12 DIAGNOSIS — E538 Deficiency of other specified B group vitamins: Secondary | ICD-10-CM | POA: Diagnosis not present

## 2019-01-12 DIAGNOSIS — F0390 Unspecified dementia without behavioral disturbance: Secondary | ICD-10-CM | POA: Insufficient documentation

## 2019-01-12 LAB — BASIC METABOLIC PANEL
BUN: 17 mg/dL (ref 6–23)
CO2: 29 mEq/L (ref 19–32)
Calcium: 9.7 mg/dL (ref 8.4–10.5)
Chloride: 106 mEq/L (ref 96–112)
Creatinine, Ser: 0.85 mg/dL (ref 0.40–1.20)
GFR: 64.49 mL/min (ref >60.00)
Glucose, Bld: 91 mg/dL (ref 70–99)
Potassium: 3.5 mEq/L (ref 3.5–5.1)
Sodium: 142 mEq/L (ref 135–145)

## 2019-01-12 LAB — CBC WITH DIFFERENTIAL/PLATELET
Basophils Absolute: 0 10*3/uL (ref 0.0–0.1)
Basophils Relative: 0.6 % (ref 0.0–3.0)
Eosinophils Absolute: 0.1 10*3/uL (ref 0.0–0.7)
Eosinophils Relative: 1.5 % (ref 0.0–5.0)
HCT: 39.8 % (ref 36.0–46.0)
Hemoglobin: 13.3 g/dL (ref 12.0–15.0)
Lymphocytes Relative: 41.8 % (ref 12.0–46.0)
Lymphs Abs: 3 10*3/uL (ref 0.7–4.0)
MCHC: 33.3 g/dL (ref 30.0–36.0)
MCV: 87.8 fl (ref 78.0–100.0)
MONOS PCT: 8.3 % (ref 3.0–12.0)
Monocytes Absolute: 0.6 10*3/uL (ref 0.1–1.0)
Neutro Abs: 3.4 10*3/uL (ref 1.4–7.7)
Neutrophils Relative %: 47.8 % (ref 43.0–77.0)
Platelets: 141 10*3/uL — ABNORMAL LOW (ref 150.0–400.0)
RBC: 4.53 Mil/uL (ref 3.87–5.11)
RDW: 13.1 % (ref 11.5–15.5)
WBC: 7.2 10*3/uL (ref 4.0–10.5)

## 2019-01-12 LAB — HEPATIC FUNCTION PANEL
ALBUMIN: 4.3 g/dL (ref 3.5–5.2)
ALT: 11 U/L (ref 0–35)
AST: 16 U/L (ref 0–37)
Alkaline Phosphatase: 38 U/L — ABNORMAL LOW (ref 39–117)
Bilirubin, Direct: 0.2 mg/dL (ref 0.0–0.3)
Total Bilirubin: 1 mg/dL (ref 0.2–1.2)
Total Protein: 7 g/dL (ref 6.0–8.3)

## 2019-01-12 LAB — TSH: TSH: 1.27 u[IU]/mL (ref 0.35–4.50)

## 2019-01-12 LAB — URINALYSIS, ROUTINE W REFLEX MICROSCOPIC
Bilirubin Urine: NEGATIVE
Ketones, ur: NEGATIVE
Leukocytes,Ua: NEGATIVE
Nitrite: NEGATIVE
Specific Gravity, Urine: 1.01 (ref 1.000–1.030)
Total Protein, Urine: NEGATIVE
Urine Glucose: NEGATIVE
Urobilinogen, UA: 0.2 (ref 0.0–1.0)
pH: 5.5 (ref 5.0–8.0)

## 2019-01-12 LAB — LIPID PANEL
Cholesterol: 185 mg/dL (ref 0–200)
HDL: 61.1 mg/dL (ref >39.00)
LDL Cholesterol: 97 mg/dL (ref 0–99)
NonHDL: 124.06
Total CHOL/HDL Ratio: 3
Triglycerides: 135 mg/dL (ref 0.0–149.0)
VLDL: 27 mg/dL (ref 0.0–40.0)

## 2019-01-12 LAB — VITAMIN B12: Vitamin B-12: 451 pg/mL (ref 211–911)

## 2019-01-12 MED ORDER — SOLIFENACIN SUCCINATE 5 MG PO TABS: ORAL_TABLET | ORAL | 3 refills | Status: DC

## 2019-01-12 MED ORDER — HYDROCHLOROTHIAZIDE 12.5 MG PO TABS: 12.5000 mg | ORAL_TABLET | Freq: Every day | ORAL | 3 refills | Status: DC

## 2019-01-12 MED ORDER — MEMANTINE HCL 10 MG PO TABS: ORAL_TABLET | ORAL | 3 refills | Status: DC

## 2019-01-12 MED ORDER — ALENDRONATE SODIUM 70 MG PO TABS: ORAL_TABLET | ORAL | 2 refills | Status: DC

## 2019-01-12 MED ORDER — GABAPENTIN 300 MG PO CAPS: ORAL_CAPSULE | ORAL | 3 refills | Status: DC

## 2019-01-12 MED ORDER — RIVASTIGMINE TARTRATE 1.5 MG PO CAPS: 1.5000 mg | ORAL_CAPSULE | Freq: Two times a day (BID) | ORAL | 3 refills | Status: DC

## 2019-01-12 NOTE — Patient Instructions (Signed)
Please continue all other medications as before, and refills have been done if requested.  Please have the pharmacy call with any other refills you may need.  Please continue your efforts at being more active, low cholesterol diet, and weight control.  You are otherwise up to date with prevention measures today.  Please keep your appointments with your specialists as you may have planned - the MRI, and Dr Tamala Julian  Please go to the LAB in the Basement (turn left off the elevator) for the tests to be done today  You will be contacted by phone if any changes need to be made immediately.  Otherwise, you will receive a letter about your results with an explanation, but please check with MyChart first.  Please remember to sign up for MyChart if you have not done so, as this will be important to you in the future with finding out test results, communicating by private email, and scheduling acute appointments online when needed.  Please return in 1 year for your yearly visit, or sooner if needed

## 2019-01-12 NOTE — Assessment & Plan Note (Signed)
stable overall by history and exam, recent data reviewed with pt, and pt to continue medical treatment as before,  to f/u any worsening symptoms or concerns  

## 2019-01-12 NOTE — Assessment & Plan Note (Signed)
No overt bleeding, for f/u lab

## 2019-01-12 NOTE — Progress Notes (Signed)
Subjective:    Patient ID: Madeline Mcdaniel, female    DOB: 09/24/1941, 78 y.o.   MRN: [id number]  HPI  Here for wellness and f/u;  Overall doing ok;  Pt denies Chest pain, worsening SOB, DOE, wheezing, orthopnea, PND, worsening LE edema, palpitations, dizziness or syncope.  Pt denies neurological change such as new headache, facial or extremity weakness.  Pt denies polydipsia, polyuria, or low sugar symptoms. Pt states overall good compliance with treatment and medications, good tolerability, and has been trying to follow appropriate diet.  Pt denies worsening depressive symptoms, suicidal ideation or panic. No fever, night sweats, wt loss, loss of appetite, or other constitutional symptoms.  Pt states good ability with ADL's, has low fall risk, home safety reviewed and adequate, no other significant changes in hearing or vision, and only occasionally active with exercise.  For MRI LS spine per sport med soon.  Dementia overall stable symptomatically with gradual worsening at best, and not assoc with behavioral changes such as hallucinations, paranoia, or agitation. Past Medical History:  Diagnosis Date  . Allergic rhinitis, cause unspecified 04/27/2011  . Anemia 04/27/2011  . Asthma 04/27/2011  . Bipolar affective disorder ([place]) 04/27/2011  . Cervical spondylosis 06/01/2012  . Chronic bronchitis 04/27/2011  . Chronic cystitis 04/27/2011  . Colon polyps 04/27/2011  . Degenerative joint disease 04/27/2011  . GERD (gastroesophageal reflux disease) 04/27/2011  . HTN (hypertension) 04/27/2011  . Hyperlipidemia 04/27/2011  . Mild cognitive impairment 04/12/2013  . Osteopenia 06/01/2012  . 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 Right carpal tunnel syndrome 07/22/2012  . TIA (transient ischemic attack) 04/27/2011  . Urinary incontinence 04/27/2011   Past Surgical History:  Procedure Laterality Date  . BLADDER SUSPENSION     A&P REPAIR WITH MESH  . BREAST LUMPECTOMY     SEVERAL TIMES  . BUNIONECTOMY     LEFT FOOT  .  CHOLECYSTECTOMY    . COLONOSCOPY  68YRS AGO   IN [place],PT DOES NOT REMEMBER DOCTOR  . Lauro Regulus Bunionectomy Left 09/09/2014   @ [facility]  . NEURECTOMY FOOT Left 09/09/2014   @ [place]  . ORTHOSCOPIC CARPETOMY     BOTH THUMBS  . POLYPECTOMY  10 YRS AGO   BENIGN POLYPS X2  . STERIOTACTIC BREAST BIOPSY     RIGHT  . TENDON TRANSFER  12/09/2012   Procedure: TENDON TRANSFER;  Surgeon: Wynonia Sours, MD;  Location: [facility];  Service: Orthopedics;  Laterality: Left;  Tenodesis PIP with FDS Slip Left ring finger, [place]  . TONSILLECTOMY    . TOTAL ABDOMINAL HYSTERECTOMY    . TRIGGER FINGER RELEASE     LEFT RING FINGER    reports that she has never smoked. She has never used smokeless tobacco. She reports that she does not drink alcohol or use drugs. family history includes Cancer (age of onset: 40) in her mother; Colon cancer in her paternal grandfather. Allergies  Allergen Reactions  . Aspirin Anaphylaxis  . Nsaids Anaphylaxis    BECAUSE OF REACTION TO VIOXX BECAUSE OF REACTION TO VIOXX  . Vioxx [Rofecoxib] Anaphylaxis  . Ciprofloxacin Other (See Comments)    HEADACHE HEADACHE   Current Outpatient Medications on File Prior to Visit  Medication Sig Dispense Refill  . acetaminophen (TYLENOL) 500 MG tablet Take 500 mg by mouth as needed.      Marland Kitchen CALCIUM PO Take by mouth as directed. Reported on 01/30/2016    . Coenzyme Q10 (CO Q 10 PO) Take 200 mg  by mouth daily.      . Cranberry 500 MG CAPS Take by mouth 2 (two) times daily.    . Fluticasone-Salmeterol (ADVAIR DISKUS IN) Inhale 1 Inhaler into the lungs as needed. 100/50    . nitrofurantoin (MACRODANTIN) 50 MG capsule TAKE 1 CAPSULE(50 MG) BY MOUTH DAILY 90 capsule 1  . Vitamin D, Ergocalciferol, (DRISDOL) 1.25 MG (50000 UT) CAPS capsule Take 1 capsule (50,000 Units total) by mouth every 7 (seven) days. 12 capsule 0   No current facility-administered medications on file prior to visit.    Review of  Systems Constitutional: Negative for other unusual diaphoresis, sweats, appetite or weight changes HENT: Negative for other worsening hearing loss, ear pain, facial swelling, mouth sores or neck stiffness.   Eyes: Negative for other worsening pain, redness or other visual disturbance.  Respiratory: Negative for other stridor or swelling Cardiovascular: Negative for other palpitations or other chest pain  Gastrointestinal: Negative for worsening diarrhea or loose stools, blood in stool, distention or other pain Genitourinary: Negative for hematuria, flank pain or other change in urine volume.  Musculoskeletal: Negative for myalgias or other joint swelling.  Skin: Negative for other color change, or other wound or worsening drainage.  Neurological: Negative for other syncope or numbness. Hematological: Negative for other adenopathy or swelling Psychiatric/Behavioral: Negative for hallucinations, other worsening agitation, SI, self-injury, or new decreased concentration All other system neg per pt    Objective:   Physical Exam BP 126/82   Pulse (!) 56   Temp 98 F (36.7 C) (Oral)   Ht 4\' 9"  (1.448 m)   Wt 97 lb (44 kg)   SpO2 97%   BMI 20.99 kg/m  VS noted,  Constitutional: Pt is oriented to person, place, and time. Appears well-developed and well-nourished, in no significant distress and comfortable Head: Normocephalic and atraumatic  Eyes: Conjunctivae and EOM are normal. Pupils are equal, round, and reactive to light Right Ear: External ear normal without discharge Left Ear: External ear normal without discharge Nose: Nose without discharge or deformity Mouth/Throat: Oropharynx is without other ulcerations and moist  Neck: Normal range of motion. Neck supple. No JVD present. No tracheal deviation present or significant neck LA or mass Cardiovascular: Normal rate, regular rhythm, normal heart sounds and intact distal pulses.   Pulmonary/Chest: WOB normal and breath sounds without  rales or wheezing  Abdominal: Soft. Bowel sounds are normal. NT. No HSM  Musculoskeletal: Normal range of motion. Exhibits no edema Lymphadenopathy: Has no other cervical adenopathy.  Neurological: Pt is alert and oriented to person, place, and time. Pt has normal reflexes. No cranial nerve deficit. Motor grossly intact, Gait intact Skin: Skin is warm and dry. No rash noted or new ulcerations Psychiatric:  Has normal mood and affect. Behavior is normal without agitation No other exam findings Lab Results  Component Value Date   WBC 6.7 01/30/2018   HGB 13.0 01/30/2018   HCT 38.8 01/30/2018   PLT 149.0 (L) 01/30/2018   GLUCOSE 79 01/30/2018   CHOL 186 01/30/2018   TRIG 117.0 01/30/2018   HDL 64.80 01/30/2018   LDLDIRECT 113.9 04/08/2013   LDLCALC 97 01/30/2018   ALT 9 01/30/2018   AST 13 01/30/2018   NA 140 01/30/2018   K 3.7 01/30/2018   CL 103 01/30/2018   CREATININE 0.69 01/30/2018   BUN 19 01/30/2018   CO2 30 01/30/2018   TSH 1.79 01/30/2018       Assessment & Plan:

## 2019-01-13 ENCOUNTER — Ambulatory Visit
Admission: RE | Admit: 2019-01-13 | Discharge: 2019-01-13 | Disposition: A | Discharge status: Home / Self Care | Payer: Medicare Other | Source: Ambulatory Visit | Attending: Family Medicine | Admitting: Family Medicine

## 2019-01-13 DIAGNOSIS — M5126 Other intervertebral disc displacement, lumbar region: Secondary | ICD-10-CM | POA: Diagnosis not present

## 2019-01-13 DIAGNOSIS — M47816 Spondylosis without myelopathy or radiculopathy, lumbar region: Secondary | ICD-10-CM | POA: Diagnosis not present

## 2019-01-13 DIAGNOSIS — M48061 Spinal stenosis, lumbar region without neurogenic claudication: Secondary | ICD-10-CM | POA: Diagnosis not present

## 2019-01-13 DIAGNOSIS — M5127 Other intervertebral disc displacement, lumbosacral region: Secondary | ICD-10-CM | POA: Diagnosis not present

## 2019-01-13 DIAGNOSIS — M5416 Radiculopathy, lumbar region: Secondary | ICD-10-CM

## 2019-01-18 ENCOUNTER — Other Ambulatory Visit: Payer: Self-pay | Admitting: Internal Medicine

## 2019-01-19 NOTE — Progress Notes (Signed)
[name] [address] [place] [place], [place] [address] Phone: [phone] Subjective:    I Madeline Mcdaniel am serving as a Education administrator for Dr. Hulan Saas.    CC: Back pain follow-up  ONG:[id number]  Madeline Mcdaniel is a 78 y.o. female coming in with complaint of back pain. Here for hip pain on the left side. Wants to talk about MRI results.   Was having worsening back pain overall.  Was sent for an MRI.  MRI was independently visualized by me showing severe L4-L5 spinal stenosis and significant other degenerative changes.  This would be consistent with patient's radicular symptoms and patient having pain with increasing activity.  Patient states that the pain has not responded at all to any of the other treatment at the moment.     Past Medical History:  Diagnosis Date  . Allergic rhinitis, cause unspecified 04/27/2011  . Anemia 04/27/2011  . Asthma 04/27/2011  . Bipolar affective disorder ([place]) 04/27/2011  . Cervical spondylosis 06/01/2012  . Chronic bronchitis 04/27/2011  . Chronic cystitis 04/27/2011  . Colon polyps 04/27/2011  . Degenerative joint disease 04/27/2011  . GERD (gastroesophageal reflux disease) 04/27/2011  . HTN (hypertension) 04/27/2011  . Hyperlipidemia 04/27/2011  . Mild cognitive impairment 04/12/2013  . Osteopenia 06/01/2012  . 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 Right carpal tunnel syndrome 07/22/2012  . TIA (transient ischemic attack) 04/27/2011  . Urinary incontinence 04/27/2011   Past Surgical History:  Procedure Laterality Date  . BLADDER SUSPENSION     A&P REPAIR WITH MESH  . BREAST LUMPECTOMY     SEVERAL TIMES  . BUNIONECTOMY     LEFT FOOT  . CHOLECYSTECTOMY    . COLONOSCOPY  31YRS AGO   IN [place],PT DOES NOT REMEMBER DOCTOR  . Lauro Regulus Bunionectomy Left 09/09/2014   @ [facility]  . NEURECTOMY FOOT Left 09/09/2014   @ [place]  . ORTHOSCOPIC CARPETOMY     BOTH THUMBS  . POLYPECTOMY  10 YRS AGO   BENIGN POLYPS X2  . STERIOTACTIC  BREAST BIOPSY     RIGHT  . TENDON TRANSFER  12/09/2012   Procedure: TENDON TRANSFER;  Surgeon: Wynonia Sours, MD;  Location: [facility];  Service: Orthopedics;  Laterality: Left;  Tenodesis PIP with FDS Slip Left ring finger, [place]  . TONSILLECTOMY    . TOTAL ABDOMINAL HYSTERECTOMY    . TRIGGER FINGER RELEASE     LEFT RING FINGER   Social History   Socioeconomic History  . Marital status: Married    Spouse name: Not on file  . Number of children: 2  . Years of education: Not on file  . Highest education level: Not on file  Occupational History  . Not on file  Social Needs  . Financial resource strain: Not on file  . Food insecurity:    Worry: Not on file    Inability: Not on file  . Transportation needs:    Medical: Not on file    Non-medical: Not on file  Tobacco Use  . Smoking status: Never Smoker  . Smokeless tobacco: Never Used  Substance and Sexual Activity  . Alcohol use: No  . Drug use: No  . Sexual activity: Not Currently  Lifestyle  . Physical activity:    Days per week: Not on file    Minutes per session: Not on file  . Stress: Not on file  Relationships  . Social connections:    Talks on phone: Not  on file    Gets together: Not on file    Attends religious service: Not on file    Active member of club or organization: Not on file    Attends meetings of clubs or organizations: Not on file    Relationship status: Not on file  Other Topics Concern  . Not on file  Social History Narrative  . Not on file   Allergies  Allergen Reactions  . Aspirin Anaphylaxis  . Nsaids Anaphylaxis    BECAUSE OF REACTION TO VIOXX BECAUSE OF REACTION TO VIOXX  . Vioxx [Rofecoxib] Anaphylaxis  . Ciprofloxacin Other (See Comments)    HEADACHE HEADACHE   Family History  Problem Relation Age of Onset  . Cancer Mother 33       RENAL CELL CARCINOMA  . Colon cancer Paternal Grandfather     Current Outpatient Medications (Endocrine & Metabolic):  .   alendronate (FOSAMAX) 70 MG tablet, TAKE 1 TABLET(70 MG) BY MOUTH EVERY 7 DAYS WITH A FULL GLASS OF WATER AND ON AN EMPTY STOMACH  Current Outpatient Medications (Cardiovascular):  .  hydrochlorothiazide (HYDRODIURIL) 12.5 MG tablet, Take 1 tablet (12.5 mg total) by mouth daily.  Current Outpatient Medications (Respiratory):  Marland Kitchen  Fluticasone-Salmeterol (ADVAIR DISKUS IN), Inhale 1 Inhaler into the lungs as needed. 100/50  Current Outpatient Medications (Analgesics):  .  acetaminophen (TYLENOL) 500 MG tablet, Take 500 mg by mouth as needed.     Current Outpatient Medications (Other):  Marland Kitchen  CALCIUM PO, Take by mouth as directed. Reported on 01/30/2016 .  Coenzyme Q10 (CO Q 10 PO), Take 200 mg by mouth daily.   .  Cranberry 500 MG CAPS, Take by mouth 2 (two) times daily. Marland Kitchen  gabapentin (NEURONTIN) 300 MG capsule, TAKE ONE CAPSULE BY MOUTH EVERY MORNING AND TAKE 2 CAPSULES EVERY EVENING .  memantine (NAMENDA) 10 MG tablet, Take 1 tablet every night for 2 weeks, then increase to 1 tablet twice a day .  nitrofurantoin (MACRODANTIN) 50 MG capsule, TAKE 1 CAPSULE(50 MG) BY MOUTH DAILY .  rivastigmine (EXELON) 1.5 MG capsule, Take 1 capsule (1.5 mg total) by mouth 2 (two) times daily. .  solifenacin (VESICARE) 5 MG tablet, TAKE 1 TABLET(5 MG) BY MOUTH DAILY .  Vitamin D, Ergocalciferol, (DRISDOL) 1.25 MG (50000 UT) CAPS capsule, Take 1 capsule (50,000 Units total) by mouth every 7 (seven) days.    Past medical history, social, surgical and family history all reviewed in electronic medical record.  No pertanent information unless stated regarding to the chief complaint.   Review of Systems:  No headache, visual changes, nausea, vomiting, diarrhea, constipation, dizziness, abdominal pain, skin rash, fevers, chills, night sweats, weight loss, swollen lymph nodes,, chest pain, shortness of breath, mood changes.  Muscle aches, body aches  Objective  Blood pressure 130/76, pulse 67, height 4\' 9"  (1.448  m), weight 95 lb (43.1 kg), SpO2 96 %.    General: No apparent distress alert she is somewhat confused at her baseline HEENT: Pupils equal, extraocular movements intact  Respiratory: Patient's speak in full sentences and does not appear short of breath  Cardiovascular: No lower extremity edema, non tender, no erythema  Skin: Warm dry intact with no signs of infection or rash on extremities or on axial skeleton.  Abdomen: Soft nontender  Neuro: Cranial nerves II through XII are intact, neurovascularly intact in all extremities with 2+ DTRs and 2+ pulses.  Lymph: No lymphadenopathy of posterior or anterior cervical chain or axillae bilaterally.  Gait  Dolgic MSK: Changes of multiple joints.   Neck exam does show loss of lordosis with degenerative scoliosis.  Significant tightness with straight leg test bilaterally.  5 degrees of extension with worsening back pain.  Patient is severely tender to palpation in the paraspinal musculature.  4-5 strength of the lower extremities.  Deep tendon reflexes though do appear intact.   Impression and Recommendations:     This case required medical decision making of moderate complexity. The above documentation has been reviewed and is accurate and complete Lyndal Pulley, DO       Note: This dictation was prepared with Dragon dictation along with smaller phrase technology. Any transcriptional errors that result from this process are unintentional.

## 2019-01-20 ENCOUNTER — Ambulatory Visit (INDEPENDENT_AMBULATORY_CARE_PROVIDER_SITE_OTHER): Payer: Medicare Other | Admitting: Family Medicine

## 2019-01-20 ENCOUNTER — Encounter: Payer: Self-pay | Admitting: Family Medicine

## 2019-01-20 VITALS — BP 130/76 | HR 67 | Ht <= 58 in | Wt 95.0 lb

## 2019-01-20 DIAGNOSIS — M545 Low back pain, unspecified: Secondary | ICD-10-CM

## 2019-01-20 DIAGNOSIS — M48062 Spinal stenosis, lumbar region with neurogenic claudication: Secondary | ICD-10-CM | POA: Diagnosis not present

## 2019-01-20 DIAGNOSIS — M549 Dorsalgia, unspecified: Secondary | ICD-10-CM | POA: Diagnosis not present

## 2019-01-20 DIAGNOSIS — M2550 Pain in unspecified joint: Secondary | ICD-10-CM

## 2019-01-20 DIAGNOSIS — G8929 Other chronic pain: Secondary | ICD-10-CM

## 2019-01-20 NOTE — Patient Instructions (Signed)
Good to see you  You should do great with an epidural  We will get it schedule but call [phone] to get it scheduled faster Once you know when the injection is going to be then call us and schedule an office visit 2-3 weeks AFTER the injection  Continue all other medicines for now.

## 2019-01-20 NOTE — Assessment & Plan Note (Signed)
Discussed with patient at great length.  We discussed which activities to do which wants to avoid. We went over patient's MRI in great detail we discussed the pathophysiology of spinal stenosis.  We discussed why patient is having the radicular symptoms.  We discussed different treatment options and patient has a chosen to try an L4-L5 epidural.  This was put in.  We discussed that this can take some time to work on a regular basis.  Patient is not on a blood thinner in the moment.  Should do well with the injection.  Follow-up with me again 2 to 3 weeks after this.  Continue all other medications.  Spent  25 minutes with patient face-to-face and had greater than 50% of counseling including as described above in assessment and plan.

## 2019-01-21 ENCOUNTER — Ambulatory Visit
Admission: RE | Admit: 2019-01-21 | Discharge: 2019-01-21 | Disposition: A | Discharge status: Home / Self Care | Payer: Medicare Other | Source: Ambulatory Visit | Attending: Family Medicine | Admitting: Family Medicine

## 2019-01-21 ENCOUNTER — Other Ambulatory Visit: Payer: Self-pay | Admitting: Family Medicine

## 2019-01-21 DIAGNOSIS — M545 Low back pain, unspecified: Secondary | ICD-10-CM

## 2019-01-21 DIAGNOSIS — M48062 Spinal stenosis, lumbar region with neurogenic claudication: Secondary | ICD-10-CM | POA: Diagnosis not present

## 2019-01-21 DIAGNOSIS — G8929 Other chronic pain: Secondary | ICD-10-CM

## 2019-01-21 MED ORDER — IOPAMIDOL (ISOVUE-M 200) INJECTION 41%
1.0000 mL | Freq: Once | INTRAMUSCULAR | Status: AC
  Administered 2019-01-21: 1 mL via EPIDURAL

## 2019-01-21 MED ORDER — METHYLPREDNISOLONE ACETATE 40 MG/ML INJ SUSP (RADIOLOG
120.0000 mg | Freq: Once | INTRAMUSCULAR | Status: AC
  Administered 2019-01-21: 120 mg via EPIDURAL

## 2019-01-21 NOTE — Discharge Instructions (Signed)

## 2019-02-08 NOTE — Progress Notes (Signed)
[name] [place], [place] [address] Phone: [phone] Subjective:    I Kandace Blitz am serving as a Education administrator for Dr. Hulan Saas.   CC: Back and hip pain  NGE:[id number]  Madeline Mcdaniel is a 78 y.o. female coming in with complaint of back and hip pain. States that she is feeling wonderful. Has no more pain.      Patient's lumbar spine did have an MRI.  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  Underwent an epidural on January 21, 2019 once again patient states that she is 100% better.  No leg pain or hip pain at all.  Past Medical History:  Diagnosis Date  . Allergic rhinitis, cause unspecified 04/27/2011  . Anemia 04/27/2011  . Asthma 04/27/2011  . Bipolar affective disorder ([place]) 04/27/2011  . Cervical spondylosis 06/01/2012  . Chronic bronchitis 04/27/2011  . Chronic cystitis 04/27/2011  . Colon polyps 04/27/2011  . Degenerative joint disease 04/27/2011  . GERD (gastroesophageal reflux disease) 04/27/2011  . HTN (hypertension) 04/27/2011  . Hyperlipidemia 04/27/2011  . Mild cognitive impairment 04/12/2013  . Osteopenia 06/01/2012  . 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 Right carpal tunnel syndrome 07/22/2012  . TIA (transient ischemic attack) 04/27/2011  . Urinary incontinence 04/27/2011   Past Surgical History:  Procedure Laterality Date  . BLADDER SUSPENSION     A&P REPAIR WITH MESH  . BREAST LUMPECTOMY     SEVERAL TIMES  . BUNIONECTOMY     LEFT FOOT  . CHOLECYSTECTOMY    . COLONOSCOPY  22YRS AGO   IN [place],PT DOES NOT REMEMBER DOCTOR  . Lauro Regulus Bunionectomy Left 09/09/2014   @ [facility]  . NEURECTOMY FOOT Left 09/09/2014   @ [place]  . ORTHOSCOPIC CARPETOMY     BOTH THUMBS  . POLYPECTOMY  10 YRS AGO   BENIGN POLYPS X2  . STERIOTACTIC BREAST BIOPSY     RIGHT  . TENDON TRANSFER  12/09/2012   Procedure: TENDON TRANSFER;  Surgeon: Wynonia Sours, MD;  Location:  [facility];  Service: Orthopedics;  Laterality: Left;  Tenodesis PIP with FDS Slip Left ring finger, [place]  . TONSILLECTOMY    . TOTAL ABDOMINAL HYSTERECTOMY    . TRIGGER FINGER RELEASE     LEFT RING FINGER   Social History   Socioeconomic History  . Marital status: Married    Spouse name: Not on file  . Number of children: 2  . Years of education: Not on file  . Highest education level: Not on file  Occupational History  . Not on file  Social Needs  . Financial resource strain: Not on file  . Food insecurity:    Worry: Not on file    Inability: Not on file  . Transportation needs:    Medical: Not on file    Non-medical: Not on file  Tobacco Use  . Smoking status: Never Smoker  . Smokeless tobacco: Never Used  Substance and Sexual Activity  . Alcohol use: No  . Drug use: No  . Sexual activity: Not Currently  Lifestyle  . Physical activity:    Days per week: Not on file    Minutes per session: Not on file  . Stress: Not on file  Relationships  . Social connections:    Talks on phone: Not on file    Gets together: Not on file    Attends religious service: Not on  file    Active member of club or organization: Not on file    Attends meetings of clubs or organizations: Not on file    Relationship status: Not on file  Other Topics Concern  . Not on file  Social History Narrative  . Not on file   Allergies  Allergen Reactions  . Aspirin Anaphylaxis  . Nsaids Anaphylaxis    BECAUSE OF REACTION TO VIOXX BECAUSE OF REACTION TO VIOXX  . Vioxx [Rofecoxib] Anaphylaxis  . Ciprofloxacin Other (See Comments)    HEADACHE HEADACHE   Family History  Problem Relation Age of Onset  . Cancer Mother 61       RENAL CELL CARCINOMA  . Colon cancer Paternal Grandfather     Current Outpatient Medications (Endocrine & Metabolic):  .  alendronate (FOSAMAX) 70 MG tablet, TAKE 1 TABLET(70 MG) BY MOUTH EVERY 7 DAYS WITH A FULL GLASS OF WATER AND ON AN EMPTY  STOMACH  Current Outpatient Medications (Cardiovascular):  .  hydrochlorothiazide (HYDRODIURIL) 12.5 MG tablet, Take 1 tablet (12.5 mg total) by mouth daily.  Current Outpatient Medications (Respiratory):  Marland Kitchen  Fluticasone-Salmeterol (ADVAIR DISKUS IN), Inhale 1 Inhaler into the lungs as needed. 100/50  Current Outpatient Medications (Analgesics):  .  acetaminophen (TYLENOL) 500 MG tablet, Take 500 mg by mouth as needed.     Current Outpatient Medications (Other):  Marland Kitchen  CALCIUM PO, Take by mouth as directed. Reported on 01/30/2016 .  Coenzyme Q10 (CO Q 10 PO), Take 200 mg by mouth daily.   .  Cranberry 500 MG CAPS, Take by mouth 2 (two) times daily. Marland Kitchen  gabapentin (NEURONTIN) 300 MG capsule, TAKE ONE CAPSULE BY MOUTH EVERY MORNING AND TAKE 2 CAPSULES EVERY EVENING .  memantine (NAMENDA) 10 MG tablet, Take 1 tablet every night for 2 weeks, then increase to 1 tablet twice a day .  nitrofurantoin (MACRODANTIN) 50 MG capsule, TAKE 1 CAPSULE(50 MG) BY MOUTH DAILY .  rivastigmine (EXELON) 1.5 MG capsule, Take 1 capsule (1.5 mg total) by mouth 2 (two) times daily. .  solifenacin (VESICARE) 5 MG tablet, TAKE 1 TABLET(5 MG) BY MOUTH DAILY .  Vitamin D, Ergocalciferol, (DRISDOL) 1.25 MG (50000 UT) CAPS capsule, Take 1 capsule (50,000 Units total) by mouth every 7 (seven) days.    Past medical history, social, surgical and family history all reviewed in electronic medical record.  No pertanent information unless stated regarding to the chief complaint.   Review of Systems:  No headache, visual changes, nausea, vomiting, diarrhea, constipation, dizziness, abdominal pain, skin rash, fevers, chills, night sweats, weight loss, swollen lymph nodes, body aches, joint swelling, , chest pain, shortness of breath, mood changes.  Positive muscle aches  Objective  Blood pressure 136/70, pulse 65, height 4\' 9"  (1.448 m), weight 94 lb (42.6 kg), SpO2 95 %.   General: No apparent distress alert but not  oriented HEENT: Pupils equal, extraocular movements intact  Respiratory: Patient's speak in full sentences and does not appear short of breath  Cardiovascular: No lower extremity edema, non tender, no erythema  Skin: Warm dry intact with no signs of infection or rash on extremities or on axial skeleton.  Abdomen: Soft nontender  Neuro: Cranial nerves II through XII are intact, neurovascularly intact in all extremities with 2+ DTRs and 2+ pulses.  Lymph: No lymphadenopathy of posterior or anterior cervical chain or axillae bilaterally.  Gait normal with good balance and coordination.  MSK:  Non tender with full range of motion and good stability  and symmetric strength and tone of shoulders, elbows, wrist, hip, knee and ankles bilaterally.  Back Exam:  Inspection: Mild loss of lordosis with mild scoliosis Motion: Flexion 35 deg, Extension 25 deg, Side Bending to 25 deg bilaterally,  Rotation to 45 deg bilaterally  SLR laying: Negative  XSLR laying: Negative  Palpable tenderness: Tender palpation of paraspinal musculature lumbar spine right greater than left. FABER: Tightness with Corky Sox. Sensory change: Gross sensation intact to all lumbar and sacral dermatomes.  Reflexes: 2+ at both patellar tendons, 2+ at achilles tendons, Babinski's downgoing.  Strength at foot  Plantar-flexion: 5/5 Dorsi-flexion: 5/5 Eversion: 5/5 Inversion: 5/5  Leg strength  Quad: 5/5 Hamstring: 5/5 Hip flexor: 5/5 Hip abductors: 5/5  Gait unremarkable.   Impression and Recommendations:     . The above documentation has been reviewed and is accurate and complete Lyndal Pulley, DO       Note: This dictation was prepared with Dragon dictation along with smaller phrase technology. Any transcriptional errors that result from this process are unintentional.

## 2019-02-09 ENCOUNTER — Ambulatory Visit (INDEPENDENT_AMBULATORY_CARE_PROVIDER_SITE_OTHER): Payer: Medicare Other | Admitting: Family Medicine

## 2019-02-09 ENCOUNTER — Encounter: Payer: Self-pay | Admitting: Family Medicine

## 2019-02-09 DIAGNOSIS — M48062 Spinal stenosis, lumbar region with neurogenic claudication: Secondary | ICD-10-CM | POA: Diagnosis not present

## 2019-02-09 NOTE — Assessment & Plan Note (Signed)
Severe overall but did respond very well to epidural injection.  Patient is 100% pain-free at the moment.  We discussed continuing the exercises intermittently over at least 1-2 times a week.  We discussed worsening symptoms patient is able to do more activity since the injection.  As long as patient is well they will can follow-up as needed.

## 2019-02-09 NOTE — Patient Instructions (Addendum)
Good to see you  Alvera Singh is your friend when pain worsens Try to do our exercises 2-3 times a week  Stay active If worsening pain or you think you need an injection again call [phone] and we will order the epidural . Then when you have the injection see me again in 2-3 weeks AFTER the injection  As long as you do well see me when you need me!

## 2019-04-08 DIAGNOSIS — Z961 Presence of intraocular lens: Secondary | ICD-10-CM | POA: Diagnosis not present

## 2019-04-08 DIAGNOSIS — H25812 Combined forms of age-related cataract, left eye: Secondary | ICD-10-CM | POA: Diagnosis not present

## 2019-06-08 ENCOUNTER — Other Ambulatory Visit: Payer: Self-pay

## 2019-06-08 DIAGNOSIS — M5416 Radiculopathy, lumbar region: Secondary | ICD-10-CM

## 2019-06-23 ENCOUNTER — Other Ambulatory Visit: Payer: Self-pay

## 2019-06-23 ENCOUNTER — Ambulatory Visit
Admission: RE | Admit: 2019-06-23 | Discharge: 2019-06-23 | Disposition: A | Discharge status: Home / Self Care | Payer: Medicare Other | Source: Ambulatory Visit | Attending: Family Medicine | Admitting: Family Medicine

## 2019-06-23 DIAGNOSIS — M5416 Radiculopathy, lumbar region: Secondary | ICD-10-CM | POA: Diagnosis not present

## 2019-06-23 MED ORDER — METHYLPREDNISOLONE ACETATE 40 MG/ML INJ SUSP (RADIOLOG
120.0000 mg | Freq: Once | INTRAMUSCULAR | Status: AC
  Administered 2019-06-23: 120 mg via EPIDURAL

## 2019-06-23 MED ORDER — IOPAMIDOL (ISOVUE-M 200) INJECTION 41%
1.0000 mL | Freq: Once | INTRAMUSCULAR | Status: AC
  Administered 2019-06-23: 1 mL via EPIDURAL

## 2019-06-23 NOTE — Discharge Instructions (Signed)

## 2019-06-26 ENCOUNTER — Telehealth: Payer: Medicare Other | Admitting: Neurology

## 2019-07-08 ENCOUNTER — Encounter: Payer: Self-pay | Admitting: Family Medicine

## 2019-07-08 ENCOUNTER — Ambulatory Visit (INDEPENDENT_AMBULATORY_CARE_PROVIDER_SITE_OTHER): Payer: Medicare Other | Admitting: Family Medicine

## 2019-07-08 DIAGNOSIS — M48062 Spinal stenosis, lumbar region with neurogenic claudication: Secondary | ICD-10-CM | POA: Diagnosis not present

## 2019-07-08 NOTE — Progress Notes (Signed)
Virtual Visit via Video Note  I connected with Madeline Mcdaniel on 07/08/19 at 11:45 AM EDT by a video enabled telemedicine application and verified that I am speaking with the correct person using two identifiers.  Location: Patient: at home   Provider: in office    I discussed the limitations of evaluation and management by telemedicine and the availability of in person appointments. The patient expressed understanding and agreed to proceed.  History of Present Illness: 78 year old female with a past medical history significant for some spinal stenosis that is severe at L4-L5 and has responded well to epidurals in the past.  Repeat epidural was done on June 23, 2019.  Patient states she is feeling 100% better at this time.  Patient states she is having no pain at all.  Feels like the first injection she had.  Denies any numbness or tingling.  Denies any weakness of the lower extremities.    Observations/Objective: Alert .  Patient appears comfortable when video.  Patient does have baseline dementia level as well.   Assessment and Plan: Patient does have lumbar spinal stenosis responded very well to epidural.  As long as patient is well can follow-up as needed.  Patient's other comorbidities would like to avoid significant number of medications.  Patient would not be a surgical candidate likely either.  Hopefully patient will do well with conservative therapy and encouraged her to continue the exercises.   Follow Up Instructions: PRn     I discussed the assessment and treatment plan with the patient. The patient was provided an opportunity to ask questions and all were answered. The patient agreed with the plan and demonstrated an understanding of the instructions.   The patient was advised to call back or seek an in-person evaluation if the symptoms worsen or if the condition fails to improve as anticipated.  I provided 16 minutes of face-to-face time during this encounter.   Lyndal Pulley, DO

## 2019-07-31 ENCOUNTER — Other Ambulatory Visit: Payer: Self-pay | Admitting: Internal Medicine

## 2019-07-31 DIAGNOSIS — Z1231 Encounter for screening mammogram for malignant neoplasm of breast: Secondary | ICD-10-CM

## 2019-08-07 ENCOUNTER — Other Ambulatory Visit: Payer: Self-pay

## 2019-08-07 ENCOUNTER — Ambulatory Visit (INDEPENDENT_AMBULATORY_CARE_PROVIDER_SITE_OTHER): Payer: Medicare Other

## 2019-08-07 DIAGNOSIS — Z23 Encounter for immunization: Secondary | ICD-10-CM

## 2019-09-11 ENCOUNTER — Other Ambulatory Visit: Payer: Self-pay

## 2019-09-11 ENCOUNTER — Ambulatory Visit
Admission: RE | Admit: 2019-09-11 | Discharge: 2019-09-11 | Disposition: A | Discharge status: Home / Self Care | Payer: Medicare Other | Source: Ambulatory Visit | Attending: Internal Medicine | Admitting: Internal Medicine

## 2019-09-11 DIAGNOSIS — Z1231 Encounter for screening mammogram for malignant neoplasm of breast: Secondary | ICD-10-CM | POA: Diagnosis not present

## 2019-10-14 ENCOUNTER — Other Ambulatory Visit: Payer: Self-pay | Admitting: Internal Medicine

## 2019-11-04 DIAGNOSIS — N390 Urinary tract infection, site not specified: Secondary | ICD-10-CM | POA: Diagnosis not present

## 2019-11-04 DIAGNOSIS — N3941 Urge incontinence: Secondary | ICD-10-CM | POA: Diagnosis not present

## 2019-11-04 DIAGNOSIS — Z87440 Personal history of urinary (tract) infections: Secondary | ICD-10-CM | POA: Diagnosis not present

## 2019-12-15 ENCOUNTER — Ambulatory Visit: Payer: Medicare Other | Attending: Internal Medicine

## 2019-12-15 DIAGNOSIS — Z23 Encounter for immunization: Secondary | ICD-10-CM | POA: Insufficient documentation

## 2019-12-15 NOTE — Progress Notes (Signed)
   Covid-19 Vaccination Clinic  Name:  KOREE WAYSON    MRN: [id number] DOB: 06/12/1941  12/15/2019  Ms. Markov was observed post Covid-19 immunization for 15 minutes without incidence. She was provided with Vaccine Information Sheet and instruction to access the V-Safe system.   Ms. Ancelet was instructed to call 911 with any severe reactions post vaccine: Marland Kitchen Difficulty breathing  . Swelling of your face and throat  . A fast heartbeat  . A bad rash all over your body  . Dizziness and weakness    Immunizations Administered    Name Date Dose VIS Date Route   Pfizer COVID-19 Vaccine 12/15/2019 10:20 AM 0.3 mL 11/13/2019 Intramuscular   Manufacturer: [employer], [employer]   Lot: [id number]   [place]: [id number]

## 2020-01-04 ENCOUNTER — Ambulatory Visit: Payer: Medicare Other | Attending: Internal Medicine

## 2020-01-04 DIAGNOSIS — Z23 Encounter for immunization: Secondary | ICD-10-CM | POA: Insufficient documentation

## 2020-01-04 NOTE — Progress Notes (Signed)
   Covid-19 Vaccination Clinic  Name:  Madeline Mcdaniel    MRN: [id number] DOB: 1941-02-03  01/04/2020  Madeline Mcdaniel was observed post Covid-19 immunization for 15 minutes without incidence. She was provided with Vaccine Information Sheet and instruction to access the V-Safe system.   Madeline Mcdaniel was instructed to call 911 with any severe reactions post vaccine: Marland Kitchen Difficulty breathing  . Swelling of your face and throat  . A fast heartbeat  . A bad rash all over your body  . Dizziness and weakness    Immunizations Administered    Name Date Dose VIS Date Route   Pfizer COVID-19 Vaccine 01/04/2020  9:37 AM 0.3 mL 11/13/2019 Intramuscular   Manufacturer: [place]   Lot: [id number]   [place]: [id number]

## 2020-01-14 ENCOUNTER — Ambulatory Visit (INDEPENDENT_AMBULATORY_CARE_PROVIDER_SITE_OTHER): Payer: Medicare Other | Admitting: Internal Medicine

## 2020-01-14 ENCOUNTER — Encounter: Payer: Self-pay | Admitting: Internal Medicine

## 2020-01-14 ENCOUNTER — Other Ambulatory Visit: Payer: Self-pay

## 2020-01-14 VITALS — BP 140/76 | HR 54 | Temp 97.2°F | Ht <= 58 in | Wt 97.2 lb

## 2020-01-14 DIAGNOSIS — E785 Hyperlipidemia, unspecified: Secondary | ICD-10-CM | POA: Diagnosis not present

## 2020-01-14 DIAGNOSIS — I10 Essential (primary) hypertension: Secondary | ICD-10-CM

## 2020-01-14 DIAGNOSIS — E559 Vitamin D deficiency, unspecified: Secondary | ICD-10-CM

## 2020-01-14 DIAGNOSIS — D649 Anemia, unspecified: Secondary | ICD-10-CM | POA: Diagnosis not present

## 2020-01-14 DIAGNOSIS — M8580 Other specified disorders of bone density and structure, unspecified site: Secondary | ICD-10-CM | POA: Diagnosis not present

## 2020-01-14 DIAGNOSIS — F0390 Unspecified dementia without behavioral disturbance: Secondary | ICD-10-CM | POA: Diagnosis not present

## 2020-01-14 DIAGNOSIS — E611 Iron deficiency: Secondary | ICD-10-CM | POA: Diagnosis not present

## 2020-01-14 DIAGNOSIS — E538 Deficiency of other specified B group vitamins: Secondary | ICD-10-CM | POA: Diagnosis not present

## 2020-01-14 LAB — CBC WITH DIFFERENTIAL/PLATELET
Basophils Absolute: 0 10*3/uL (ref 0.0–0.1)
Basophils Relative: 0.2 % (ref 0.0–3.0)
Eosinophils Absolute: 0.1 10*3/uL (ref 0.0–0.7)
Eosinophils Relative: 0.8 % (ref 0.0–5.0)
HCT: 40.6 % (ref 36.0–46.0)
Hemoglobin: 13.3 g/dL (ref 12.0–15.0)
Lymphocytes Relative: 38.9 % (ref 12.0–46.0)
Lymphs Abs: 2.6 10*3/uL (ref 0.7–4.0)
MCHC: 32.8 g/dL (ref 30.0–36.0)
MCV: 87.9 fl (ref 78.0–100.0)
Monocytes Absolute: 0.5 10*3/uL (ref 0.1–1.0)
Monocytes Relative: 6.9 % (ref 3.0–12.0)
Neutro Abs: 3.6 10*3/uL (ref 1.4–7.7)
Neutrophils Relative %: 53.2 % (ref 43.0–77.0)
Platelets: 140 10*3/uL — ABNORMAL LOW (ref 150.0–400.0)
RBC: 4.62 Mil/uL (ref 3.87–5.11)
RDW: 13.2 % (ref 11.5–15.5)
WBC: 6.8 10*3/uL (ref 4.0–10.5)

## 2020-01-14 LAB — BASIC METABOLIC PANEL
BUN: 15 mg/dL (ref 6–23)
CO2: 32 mEq/L (ref 19–32)
Calcium: 9.8 mg/dL (ref 8.4–10.5)
Chloride: 105 mEq/L (ref 96–112)
Creatinine, Ser: 0.68 mg/dL (ref 0.40–1.20)
GFR: 83.22 mL/min (ref >60.00)
Glucose, Bld: 88 mg/dL (ref 70–99)
Potassium: 3.1 mEq/L — ABNORMAL LOW (ref 3.5–5.1)
Sodium: 142 mEq/L (ref 135–145)

## 2020-01-14 LAB — HEPATIC FUNCTION PANEL
ALT: 8 U/L (ref 0–35)
AST: 14 U/L (ref 0–37)
Albumin: 4.2 g/dL (ref 3.5–5.2)
Alkaline Phosphatase: 34 U/L — ABNORMAL LOW (ref 39–117)
Bilirubin, Direct: 0.2 mg/dL (ref 0.0–0.3)
Total Bilirubin: 1.2 mg/dL (ref 0.2–1.2)
Total Protein: 6.9 g/dL (ref 6.0–8.3)

## 2020-01-14 LAB — TSH: TSH: 1.59 u[IU]/mL (ref 0.35–4.50)

## 2020-01-14 LAB — LIPID PANEL
Cholesterol: 203 mg/dL — ABNORMAL HIGH (ref 0–200)
HDL: 62.4 mg/dL (ref >39.00)
LDL Cholesterol: 116 mg/dL — ABNORMAL HIGH (ref 0–99)
NonHDL: 141.06
Total CHOL/HDL Ratio: 3
Triglycerides: 127 mg/dL (ref 0.0–149.0)
VLDL: 25.4 mg/dL (ref 0.0–40.0)

## 2020-01-14 LAB — VITAMIN B12: Vitamin B-12: 364 pg/mL (ref 211–911)

## 2020-01-14 LAB — VITAMIN D 25 HYDROXY (VIT D DEFICIENCY, FRACTURES): VITD: 22.94 ng/mL — ABNORMAL LOW (ref 30.00–100.00)

## 2020-01-14 MED ORDER — NITROFURANTOIN MACROCRYSTAL 50 MG PO CAPS: ORAL_CAPSULE | ORAL | 1 refills | Status: DC

## 2020-01-14 MED ORDER — RIVASTIGMINE TARTRATE 1.5 MG PO CAPS: 1.5000 mg | ORAL_CAPSULE | Freq: Two times a day (BID) | ORAL | 3 refills | Status: DC

## 2020-01-14 MED ORDER — GABAPENTIN 300 MG PO CAPS: ORAL_CAPSULE | ORAL | 5 refills | Status: DC

## 2020-01-14 MED ORDER — SOLIFENACIN SUCCINATE 5 MG PO TABS: ORAL_TABLET | ORAL | 3 refills | Status: DC

## 2020-01-14 MED ORDER — MEMANTINE HCL 10 MG PO TABS: ORAL_TABLET | ORAL | 3 refills | Status: DC

## 2020-01-14 MED ORDER — HYDROCHLOROTHIAZIDE 12.5 MG PO TABS: 12.5000 mg | ORAL_TABLET | Freq: Every day | ORAL | 3 refills | Status: DC

## 2020-01-14 NOTE — Patient Instructions (Signed)
Ok to stop the fosamax  Please continue all other medications as before, and refills have been done if requested.  Please have the pharmacy call with any other refills you may need.  Please continue your efforts at being more active, low cholesterol diet, and weight control.  You are otherwise up to date with prevention measures today.  Please keep your appointments with your specialists as you may have planned  You had blood drawn today  You will be contacted by phone if any changes need to be made immediately.  Otherwise, you will receive a letter about your results with an explanation, but please check with MyChart first.  Please remember to sign up for MyChart if you have not done so, as this will be important to you in the future with finding out test results, communicating by private email, and scheduling acute appointments online when needed.  Please make an Appointment to return for your 1 year visit, or sooner if needed

## 2020-01-14 NOTE — Progress Notes (Signed)
Subjective:    Patient ID: Madeline Mcdaniel, female    DOB: 28-Jul-1941, 79 y.o.   MRN: [id number]  HPI  Here for yearly f/u;  Overall doing ok;  Pt denies Chest pain, worsening SOB, DOE, wheezing, orthopnea, PND, worsening LE edema, palpitations, dizziness or syncope.  Pt denies neurological change such as new headache, facial or extremity weakness.  Pt denies polydipsia, polyuria, or low sugar symptoms. Pt states overall good compliance with treatment and medications, good tolerability, and has been trying to follow appropriate diet.  Pt denies worsening depressive symptoms, suicidal ideation or panic. No fever, night sweats, wt loss, loss of appetite, or other constitutional symptoms.  Pt states good ability with ADL's, has low fall risk, home safety reviewed and adequate, no other significant changes in hearing or vision, and only occasionally active with exercise. S/p COVID shot x 2.  No new complints.  Has been on fosamax approx 5 yrs  Dementia overall stable symptomatically, and not assoc with behavioral changes such as hallucinations, paranoia, or agitation. Past Medical History:  Diagnosis Date  . Allergic rhinitis, cause unspecified 04/27/2011  . Anemia 04/27/2011  . Asthma 04/27/2011  . Bipolar affective disorder ([place]) 04/27/2011  . Cervical spondylosis 06/01/2012  . Chronic bronchitis 04/27/2011  . Chronic cystitis 04/27/2011  . Colon polyps 04/27/2011  . Degenerative joint disease 04/27/2011  . GERD (gastroesophageal reflux disease) 04/27/2011  . HTN (hypertension) 04/27/2011  . Hyperlipidemia 04/27/2011  . Mild cognitive impairment 04/12/2013  . Osteopenia 06/01/2012  . 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 Right carpal tunnel syndrome 07/22/2012  . TIA (transient ischemic attack) 04/27/2011  . Urinary incontinence 04/27/2011   Past Surgical History:  Procedure Laterality Date  . BLADDER SUSPENSION     A&P REPAIR WITH MESH  . BREAST BIOPSY    . BREAST LUMPECTOMY     SEVERAL TIMES  . BUNIONECTOMY     LEFT FOOT  . CHOLECYSTECTOMY    . COLONOSCOPY  39YRS AGO   IN [place],PT DOES NOT REMEMBER DOCTOR  . Lauro Regulus Bunionectomy Left 09/09/2014   @ [facility]  . NEURECTOMY FOOT Left 09/09/2014   @ [place]  . ORTHOSCOPIC CARPETOMY     BOTH THUMBS  . POLYPECTOMY  10 YRS AGO   BENIGN POLYPS X2  . STERIOTACTIC BREAST BIOPSY     RIGHT  . TENDON TRANSFER  12/09/2012   Procedure: TENDON TRANSFER;  Surgeon: Wynonia Sours, MD;  Location: [facility];  Service: Orthopedics;  Laterality: Left;  [place] PIP with FDS Slip Left ring finger, [place]  . TONSILLECTOMY    . TOTAL ABDOMINAL HYSTERECTOMY    . TRIGGER FINGER RELEASE     LEFT RING FINGER    reports that she has never smoked. She has never used smokeless tobacco. She reports that she does not drink alcohol or use drugs. family history includes Cancer (age of onset: 91) in her mother; Colon cancer in her paternal grandfather. Allergies  Allergen Reactions  . Aspirin Anaphylaxis  . Nsaids Anaphylaxis    BECAUSE OF REACTION TO VIOXX BECAUSE OF REACTION TO VIOXX  . Vioxx [Rofecoxib] Anaphylaxis  . Ciprofloxacin Other (See Comments)    HEADACHE HEADACHE   Current Outpatient Medications on File Prior to Visit  Medication Sig Dispense Refill  . acetaminophen (TYLENOL) 500 MG tablet Take 500 mg by mouth as needed.      Marland Kitchen CALCIUM PO Take by mouth as directed. Reported on 01/30/2016    . Coenzyme Q10 (  CO Q 10 PO) Take 200 mg by mouth daily.      . Cranberry 500 MG CAPS Take by mouth 2 (two) times daily.    . Fluticasone-Salmeterol (ADVAIR DISKUS IN) Inhale 1 Inhaler into the lungs as needed. 100/50    . Vitamin D, Ergocalciferol, (DRISDOL) 1.25 MG (50000 UT) CAPS capsule Take 1 capsule (50,000 Units total) by mouth every 7 (seven) days. (Patient not taking: Reported on 01/14/2020) 12 capsule 0   No current facility-administered medications on file prior to visit.   Review of Systems All otherwise neg per pt      Objective:   Physical Exam BP 140/76   Pulse (!) 54   Temp (!) 97.2 F (36.2 C)   Ht 4\' 9"  (1.448 m)   Wt 97 lb 3.2 oz (44.1 kg)   SpO2 98%   BMI 21.03 kg/m  VS noted,  Constitutional: Pt appears in NAD HENT: Head: NCAT.  Right Ear: External ear normal.  Left Ear: External ear normal.  Eyes: . Pupils are equal, round, and reactive to light. Conjunctivae and EOM are normal Nose: without d/c or deformity Neck: Neck supple. Gross normal ROM Cardiovascular: Normal rate and regular rhythm.   Pulmonary/Chest: Effort normal and breath sounds without rales or wheezing.  Abd:  Soft, NT, ND, + BS, no organomegaly Neurological: Pt is alert. At baseline orientation, motor grossly intact Skin: Skin is warm. No rashes, other new lesions, no LE edema Psychiatric: Pt behavior is normal without agitation  All otherwise neg per pt  Lab Results  Component Value Date   WBC 6.8 01/14/2020   HGB 13.3 01/14/2020   HCT 40.6 01/14/2020   PLT 140.0 (L) 01/14/2020   GLUCOSE 88 01/14/2020   CHOL 203 (H) 01/14/2020   TRIG 127.0 01/14/2020   HDL 62.40 01/14/2020   LDLDIRECT 113.9 04/08/2013   LDLCALC 116 (H) 01/14/2020   ALT 8 01/14/2020   AST 14 01/14/2020   NA 142 01/14/2020   K 3.1 (L) 01/14/2020   CL 105 01/14/2020   CREATININE 0.68 01/14/2020   BUN 15 01/14/2020   CO2 32 01/14/2020   TSH 1.59 01/14/2020        Assessment & Plan:

## 2020-01-14 NOTE — Assessment & Plan Note (Signed)
stable overall by history and exam, recent data reviewed with pt, and pt to continue medical treatment as before,  to f/u any worsening symptoms or concerns  

## 2020-01-14 NOTE — Assessment & Plan Note (Signed)
stable overall by history and exam, recent data reviewed with pt, and pt to continue medical treatment as before,  to f/u any worsening symptoms or concerns, for iron level

## 2020-01-14 NOTE — Assessment & Plan Note (Signed)
Ok to stop the fosamax

## 2020-01-14 NOTE — Assessment & Plan Note (Addendum)
stable overall by history and exam, recent data reviewed with pt, and pt to continue medical treatment as before,  to f/u any worsening symptoms or concerns  I spent 32 minutes preparing to see the patient by review of recent labs, imaging and procedures, obtaining and reviewing separately obtained history, communicating with the patient and family or caregiver, ordering medications, tests or procedures, and documenting clinical information in the EHR including the differential Dx, treatment, and any further evaluation and other management of HLD, dementia, HTN, aneami, , osteopenia

## 2020-01-15 LAB — URINALYSIS, ROUTINE W REFLEX MICROSCOPIC
Bilirubin Urine: NEGATIVE
Hgb urine dipstick: NEGATIVE
Ketones, ur: NEGATIVE
Leukocytes,Ua: NEGATIVE
Nitrite: NEGATIVE
Specific Gravity, Urine: 1.025 (ref 1.000–1.030)
Total Protein, Urine: NEGATIVE
Urine Glucose: NEGATIVE
Urobilinogen, UA: 0.2 (ref 0.0–1.0)
pH: 5.5 (ref 5.0–8.0)

## 2020-01-16 ENCOUNTER — Other Ambulatory Visit: Payer: Self-pay | Admitting: Internal Medicine

## 2020-01-16 MED ORDER — VITAMIN D (ERGOCALCIFEROL) 1.25 MG (50000 UNIT) PO CAPS: 50000.0000 [IU] | ORAL_CAPSULE | ORAL | 0 refills | Status: DC

## 2020-01-16 MED ORDER — POTASSIUM CHLORIDE ER 10 MEQ PO TBCR: 10.0000 meq | EXTENDED_RELEASE_TABLET | Freq: Every day | ORAL | 0 refills | Status: DC

## 2020-01-18 MED ORDER — VITAMIN D (ERGOCALCIFEROL) 1.25 MG (50000 UNIT) PO CAPS: 50000.0000 [IU] | ORAL_CAPSULE | ORAL | 0 refills | Status: DC

## 2020-01-18 MED ORDER — POTASSIUM CHLORIDE ER 10 MEQ PO TBCR: 10.0000 meq | EXTENDED_RELEASE_TABLET | Freq: Every day | ORAL | 0 refills | Status: DC

## 2020-01-18 NOTE — Addendum Note (Signed)
Addended by: Breck Coons on: 01/18/2020 09:57 AM   Modules accepted: Orders

## 2020-01-18 NOTE — Addendum Note (Signed)
Addended by: Breck Coons on: 01/18/2020 09:47 AM   Modules accepted: Orders

## 2020-08-22 DIAGNOSIS — Z23 Encounter for immunization: Secondary | ICD-10-CM | POA: Diagnosis not present

## 2020-08-27 ENCOUNTER — Other Ambulatory Visit: Payer: Self-pay | Admitting: Internal Medicine

## 2020-08-31 DIAGNOSIS — Z23 Encounter for immunization: Secondary | ICD-10-CM | POA: Diagnosis not present

## 2020-09-07 ENCOUNTER — Other Ambulatory Visit: Payer: Self-pay | Admitting: Internal Medicine

## 2020-09-07 DIAGNOSIS — Z1231 Encounter for screening mammogram for malignant neoplasm of breast: Secondary | ICD-10-CM

## 2020-10-12 ENCOUNTER — Ambulatory Visit
Admission: RE | Admit: 2020-10-12 | Discharge: 2020-10-12 | Disposition: A | Discharge status: Home / Self Care | Payer: Medicare Other | Source: Ambulatory Visit | Attending: Internal Medicine | Admitting: Internal Medicine

## 2020-10-12 ENCOUNTER — Other Ambulatory Visit: Payer: Self-pay

## 2020-10-12 DIAGNOSIS — Z1231 Encounter for screening mammogram for malignant neoplasm of breast: Secondary | ICD-10-CM

## 2020-11-09 DIAGNOSIS — N3941 Urge incontinence: Secondary | ICD-10-CM | POA: Diagnosis not present

## 2020-11-09 DIAGNOSIS — Z87440 Personal history of urinary (tract) infections: Secondary | ICD-10-CM | POA: Diagnosis not present

## 2020-12-03 DIAGNOSIS — F319 Bipolar disorder, unspecified: Secondary | ICD-10-CM | POA: Insufficient documentation

## 2020-12-03 DIAGNOSIS — F32A Depression, unspecified: Secondary | ICD-10-CM | POA: Insufficient documentation

## 2020-12-03 DIAGNOSIS — I10 Essential (primary) hypertension: Secondary | ICD-10-CM | POA: Insufficient documentation

## 2020-12-03 DIAGNOSIS — K219 Gastro-esophageal reflux disease without esophagitis: Secondary | ICD-10-CM | POA: Insufficient documentation

## 2020-12-23 ENCOUNTER — Telehealth (INDEPENDENT_AMBULATORY_CARE_PROVIDER_SITE_OTHER): Payer: Medicare Other | Admitting: Neurology

## 2020-12-23 ENCOUNTER — Encounter: Payer: Self-pay | Admitting: Neurology

## 2020-12-23 ENCOUNTER — Other Ambulatory Visit: Payer: Self-pay

## 2020-12-23 ENCOUNTER — Telehealth: Payer: Medicare Other | Admitting: Neurology

## 2020-12-23 VITALS — Ht <= 58 in | Wt 94.0 lb

## 2020-12-23 DIAGNOSIS — F0150 Vascular dementia without behavioral disturbance: Secondary | ICD-10-CM

## 2020-12-23 MED ORDER — RIVASTIGMINE TARTRATE 3 MG PO CAPS: 3.0000 mg | ORAL_CAPSULE | Freq: Two times a day (BID) | ORAL | 11 refills | Status: DC

## 2020-12-23 MED ORDER — MEMANTINE HCL 10 MG PO TABS
ORAL_TABLET | ORAL | 3 refills | Status: DC
Start: 2020-12-23 — End: 2021-10-21
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
## 2021-01-16 ENCOUNTER — Other Ambulatory Visit: Payer: Self-pay

## 2021-01-17 ENCOUNTER — Ambulatory Visit (INDEPENDENT_AMBULATORY_CARE_PROVIDER_SITE_OTHER): Payer: Medicare Other | Admitting: Internal Medicine

## 2021-01-17 ENCOUNTER — Encounter: Payer: Self-pay | Admitting: Internal Medicine

## 2021-01-17 VITALS — BP 124/74 | HR 53 | Temp 98.2°F | Ht <= 58 in | Wt 94.0 lb

## 2021-01-17 DIAGNOSIS — M25511 Pain in right shoulder: Secondary | ICD-10-CM | POA: Insufficient documentation

## 2021-01-17 DIAGNOSIS — N390 Urinary tract infection, site not specified: Secondary | ICD-10-CM

## 2021-01-17 DIAGNOSIS — E785 Hyperlipidemia, unspecified: Secondary | ICD-10-CM | POA: Diagnosis not present

## 2021-01-17 DIAGNOSIS — R296 Repeated falls: Secondary | ICD-10-CM | POA: Diagnosis not present

## 2021-01-17 DIAGNOSIS — E559 Vitamin D deficiency, unspecified: Secondary | ICD-10-CM | POA: Insufficient documentation

## 2021-01-17 DIAGNOSIS — F0390 Unspecified dementia without behavioral disturbance: Secondary | ICD-10-CM | POA: Diagnosis not present

## 2021-01-17 DIAGNOSIS — E538 Deficiency of other specified B group vitamins: Secondary | ICD-10-CM | POA: Diagnosis not present

## 2021-01-17 DIAGNOSIS — R739 Hyperglycemia, unspecified: Secondary | ICD-10-CM | POA: Diagnosis not present

## 2021-01-17 DIAGNOSIS — I10 Essential (primary) hypertension: Secondary | ICD-10-CM

## 2021-01-17 MED ORDER — THERA-D 2000 50 MCG (2000 UT) PO TABS: ORAL_TABLET | ORAL | 99 refills | Status: DC

## 2021-01-17 NOTE — Assessment & Plan Note (Signed)
New onest post fall yesterday; for tylneol prn, also refer sport medicine for r/o rot cuff tearing

## 2021-01-17 NOTE — Assessment & Plan Note (Signed)
Pt has stopped her namenda and rivastigmine; I encouraged her to restart and try again, and f/u with neurology for any further concernes

## 2021-01-17 NOTE — Assessment & Plan Note (Signed)
Last vitamin D Lab Results  Component Value Date   VD25OH 22.94 (L) 01/14/2020   low, to start oral replacement D3 at 2000 u qd

## 2021-01-17 NOTE — Assessment & Plan Note (Addendum)
Also for labs as ordered today, and refer for outpatient PT near [place] per husband

## 2021-01-17 NOTE — Assessment & Plan Note (Signed)
For fu urine culture with lab r/o silent uti

## 2021-01-17 NOTE — Patient Instructions (Addendum)
Please take all medication as prescribed  Please take OTC Vitamin D3 at 2000 units per day, indefinitely.  Please have the pharmacy call with any other refills you may need.  Please continue your efforts at being more active, low cholesterol diet  You are otherwise up to date with prevention measures today.  Please keep your appointments with your specialists as you may have planned  You will be contacted regarding the referral for: outpatient Physical Therapy, and Sports Medicine  Please go to the LAB at the blood drawing area for the tests to be done - at the [facility] LAB tomorrow  You will be contacted by phone if any changes need to be made immediately.  Otherwise, you will receive a letter about your results with an explanation, but please check with MyChart first.  Please remember to sign up for MyChart if you have not done so, as this will be important to you in the future with finding out test results, communicating by private email, and scheduling acute appointments online when needed.  Please make an Appointment to return in 6 months, or sooner if needed

## 2021-01-17 NOTE — Progress Notes (Signed)
Patient ID: Madeline Mcdaniel, female   DOB: Nov 27, 1941, 80 y.o.   MRN: [id number]         Chief Complaint:: yearly exam       HPI:  Madeline Mcdaniel is a 80 y.o. female here with husband who states over the past yr, he has seen he become more frail, generalized weakness, and with several falls without injury, until yesterday with getting off balances and fell to right side, initially did not have any complaint, but later had right shoulder soreness, and today cannot abduct more then about 80 degrees without increased pain; denies head trauma, neck pain or arm pain/weak/numbness.  Denies recent fever, ST, HA, cough, chills, CP, sob, dysuria or other GU symptoms.  Dementia overall stable symptomatically, and not assoc with behavioral changes such as paranoia, or agitation, though has had hallucinations such as seeing spiders on the walls and woke the husband up at 2am recenlty for him to check this.   The rivastigmine was increased recenlty per neurology from 1.5 to 3 mg and had n/v episode with this, so for some reason stopped ALL of her meds until comes in today  Wt Readings from Last 3 Encounters:  01/17/21 94 lb (42.6 kg)  12/23/20 94 lb (42.6 kg)  01/14/20 97 lb 3.2 oz (44.1 kg)   BP Readings from Last 3 Encounters:  01/17/21 124/74  01/14/20 140/76  06/23/19 (!) 160/69   Immunization History  Administered Date(s) Administered  . DTaP 09/18/2009  . Fluad Quad(high Dose 65+) 08/07/2019  . Influenza Split 08/30/2011, 09/16/2012  . Influenza, High Dose Seasonal PF 07/31/2017  . Influenza,inj,Quad PF,6+ Mos 08/25/2013, 08/25/2014, 08/16/2015, 07/31/2016  . PFIZER(Purple Top)SARS-COV-2 Vaccination 12/15/2019, 01/04/2020  . Pneumococcal Conjugate-13 12/03/2006, 10/01/2013  . Pneumococcal Polysaccharide-23 12/03/2009  . Tetanus 04/08/2013  . Zoster 12/03/2009   There are no preventive care reminders to display for this patient.   Past Medical History:  Diagnosis Date  . Allergic rhinitis, cause  unspecified 04/27/2011  . Anemia 04/27/2011  . Asthma 04/27/2011  . Bipolar affective disorder ([place]) 04/27/2011  . Cervical spondylosis 06/01/2012  . Chronic bronchitis 04/27/2011  . Chronic cystitis 04/27/2011  . Colon polyps 04/27/2011  . Degenerative joint disease 04/27/2011  . GERD (gastroesophageal reflux disease) 04/27/2011  . HTN (hypertension) 04/27/2011  . Hyperlipidemia 04/27/2011  . Mild cognitive impairment 04/12/2013  . Osteopenia 06/01/2012  . 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 Right carpal tunnel syndrome 07/22/2012  . TIA (transient ischemic attack) 04/27/2011  . Urinary incontinence 04/27/2011   Past Surgical History:  Procedure Laterality Date  . BLADDER SUSPENSION     A&P REPAIR WITH MESH  . BREAST BIOPSY    . BREAST LUMPECTOMY     SEVERAL TIMES  . BUNIONECTOMY     LEFT FOOT  . CHOLECYSTECTOMY    . COLONOSCOPY  19YRS AGO   IN [place],PT DOES NOT REMEMBER DOCTOR  . Lauro Regulus Bunionectomy Left 09/09/2014   @ [facility]  . NEURECTOMY FOOT Left 09/09/2014   @ [facility]  . ORTHOSCOPIC CARPETOMY     BOTH THUMBS  . POLYPECTOMY  10 YRS AGO   BENIGN POLYPS X2  . STERIOTACTIC BREAST BIOPSY     RIGHT  . TENDON TRANSFER  12/09/2012   Procedure: TENDON TRANSFER;  Surgeon: Wynonia Sours, MD;  Location: [facility];  Service: Orthopedics;  Laterality: Left;  [place] PIP with FDS Slip Left ring finger, [place]  . TONSILLECTOMY    . TOTAL ABDOMINAL HYSTERECTOMY    .  TRIGGER FINGER RELEASE     LEFT RING FINGER    reports that she has never smoked. She has never used smokeless tobacco. She reports that she does not drink alcohol and does not use drugs. family history includes Cancer (age of onset: 76) in her mother; Colon cancer in her paternal grandfather. Allergies  Allergen Reactions  . Aspirin Anaphylaxis  . Nsaids Anaphylaxis    BECAUSE OF REACTION TO VIOXX BECAUSE OF REACTION TO VIOXX  . Vioxx [Rofecoxib] Anaphylaxis  . Ciprofloxacin Other (See Comments)     HEADACHE HEADACHE   Current Outpatient Medications on File Prior to Visit  Medication Sig Dispense Refill  . acetaminophen (TYLENOL) 500 MG tablet Take 500 mg by mouth as needed.    . Coenzyme Q10 (CO Q 10 PO) Take 200 mg by mouth daily.    . Fluticasone-Salmeterol (ADVAIR DISKUS IN) Inhale 1 Inhaler into the lungs as needed. 100/50    . gabapentin (NEURONTIN) 300 MG capsule TAKE 1 CAPSULE BY MOUTH EVERY MORNING AND TAKE 2 CAPSULES BY MOUTH EVERY EVENING 90 capsule 5  . hydrochlorothiazide (HYDRODIURIL) 12.5 MG tablet Take 1 tablet (12.5 mg total) by mouth daily. 90 tablet 3  . memantine (NAMENDA) 10 MG tablet Take 1 tablet twice a day 180 tablet 3  . nitrofurantoin (MACRODANTIN) 50 MG capsule 1 by mouth twice per day as needed 90 capsule 1  . rivastigmine (EXELON) 3 MG capsule Take 1 capsule (3 mg total) by mouth 2 (two) times daily. 60 capsule 11  . solifenacin (VESICARE) 5 MG tablet TAKE 1 TABLET(5 MG) BY MOUTH DAILY 90 tablet 3  . CALCIUM PO Take by mouth as directed. Reported on 01/30/2016 (Patient not taking: Reported on 01/17/2021)    . Cranberry 500 MG CAPS Take by mouth 2 (two) times daily. (Patient not taking: Reported on 01/17/2021)    . potassium chloride (KLOR-CON 10) 10 MEQ tablet Take 1 tablet (10 mEq total) by mouth daily for 5 days. 5 tablet 0   No current facility-administered medications on file prior to visit.        ROS:  All others reviewed and negative.  Objective        PE:  BP 124/74   Pulse (!) 53   Temp 98.2 F (36.8 C) (Oral)   Ht 4\' 9"  (1.448 m)   Wt 94 lb (42.6 kg)   SpO2 99%   BMI 20.34 kg/m                 Constitutional: Pt appears in NAD               HENT: Head: NCAT.                Right Ear: External ear normal.                 Left Ear: External ear normal.                Eyes: . Pupils are equal, round, and reactive to light. Conjunctivae and EOM are normal               Nose: without d/c or deformity               Neck: Neck supple. Gross  normal ROM               Cardiovascular: Normal rate and regular rhythm.  Pulmonary/Chest: Effort normal and breath sounds without rales or wheezing. ; right shouder with pain on abduction to 80 degrees only               Abd:  Soft, NT, ND, + BS, no organomegaly               Neurological: Pt is alert. At baseline orientation, motor grossly intact               Skin: Skin is warm. No rashes, no other new lesions, LE edema - none               Psychiatric: Pt behavior is normal without agitation   Micro: none  Cardiac tracings I have personally interpreted today:  none  Pertinent Radiological findings (summarize): none   Lab Results  Component Value Date   WBC 6.8 01/14/2020   HGB 13.3 01/14/2020   HCT 40.6 01/14/2020   PLT 140.0 (L) 01/14/2020   GLUCOSE 88 01/14/2020   CHOL 203 (H) 01/14/2020   TRIG 127.0 01/14/2020   HDL 62.40 01/14/2020   LDLDIRECT 113.9 04/08/2013   LDLCALC 116 (H) 01/14/2020   ALT 8 01/14/2020   AST 14 01/14/2020   NA 142 01/14/2020   K 3.1 (L) 01/14/2020   CL 105 01/14/2020   CREATININE 0.68 01/14/2020   BUN 15 01/14/2020   CO2 32 01/14/2020   TSH 1.59 01/14/2020   Assessment/Plan:  SELETHA ZIMMERMANN is a 80 y.o. White or Caucasian [1] female with  has a past medical history of Allergic rhinitis, cause unspecified (04/27/2011), Anemia (04/27/2011), Asthma (04/27/2011), Bipolar affective disorder ([place]) (04/27/2011), Cervical spondylosis (06/01/2012), Chronic bronchitis (04/27/2011), Chronic cystitis (04/27/2011), Colon polyps (04/27/2011), Degenerative joint disease (04/27/2011), GERD (gastroesophageal reflux disease) (04/27/2011), HTN (hypertension) (04/27/2011), Hyperlipidemia (04/27/2011), Mild cognitive impairment (04/12/2013), Osteopenia (06/01/2012), Recurrent UTI (04/27/2011), Right carpal tunnel syndrome (07/22/2012), TIA (transient ischemic attack) (04/27/2011), and Urinary incontinence (04/27/2011).  Dementia without behavioral disturbance (HCC) Pt has  stopped her namenda and rivastigmine; I encouraged her to restart and try again, and f/u with neurology for any further concernes  HTN (hypertension) BP Readings from Last 3 Encounters:  01/17/21 124/74  01/14/20 140/76  06/23/19 (!) 160/69   Stable, pt to continue medical treatment and restart the hct 12.5 mg daily   Current Outpatient Medications (Cardiovascular):  .  hydrochlorothiazide (HYDRODIURIL) 12.5 MG tablet, Take 1 tablet (12.5 mg total) by mouth daily.  Current Outpatient Medications (Respiratory):  Marland Kitchen  Fluticasone-Salmeterol (ADVAIR DISKUS IN), Inhale 1 Inhaler into the lungs as needed. 100/50  Current Outpatient Medications (Analgesics):  .  acetaminophen (TYLENOL) 500 MG tablet, Take 500 mg by mouth as needed.   Current Outpatient Medications (Other):  Marland Kitchen  Cholecalciferol (THERA-D 2000) 50 MCG (2000 UT) TABS, 1 tab by mouth once daily .  Coenzyme Q10 (CO Q 10 PO), Take 200 mg by mouth daily. Marland Kitchen  gabapentin (NEURONTIN) 300 MG capsule, TAKE 1 CAPSULE BY MOUTH EVERY MORNING AND TAKE 2 CAPSULES BY MOUTH EVERY EVENING .  memantine (NAMENDA) 10 MG tablet, Take 1 tablet twice a day .  nitrofurantoin (MACRODANTIN) 50 MG capsule, 1 by mouth twice per day as needed .  rivastigmine (EXELON) 3 MG capsule, Take 1 capsule (3 mg total) by mouth 2 (two) times daily. .  solifenacin (VESICARE) 5 MG tablet, TAKE 1 TABLET(5 MG) BY MOUTH DAILY .  CALCIUM PO, Take by mouth as directed. Reported on 01/30/2016 (Patient not taking: Reported on 01/17/2021) .  Cranberry  500 MG CAPS, Take by mouth 2 (two) times daily. (Patient not taking: Reported on 01/17/2021) .  potassium chloride (KLOR-CON 10) 10 MEQ tablet, Take 1 tablet (10 mEq total) by mouth daily for 5 days.   Hyperlipidemia Lab Results  Component Value Date   LDLCALC 116 (H) 01/14/2020   Stable, pt to continue current lo chol diet  Recurrent falls Also for labs as ordered today, and [place] per [place]  Recurrent UTI For fu urine culture with lab r/o silent uti  Right shoulder pain New onest post fall yesterday; for tylneol prn, also refer sport medicine for r/o rot cuff tearing  Vitamin D deficiency Last vitamin D Lab Results  Component Value Date   VD25OH 22.94 (L) 01/14/2020   low, to start oral replacement D3 at 2000 u qd  Followup: Return in about 6 months (around 07/17/2021).  Cathlean Cower, MD 01/17/2021 10:14 PM [facility]

## 2021-01-17 NOTE — Assessment & Plan Note (Signed)
Lab Results  Component Value Date   LDLCALC 116 (H) 01/14/2020   Stable, pt to continue current lo chol diet

## 2021-01-17 NOTE — Assessment & Plan Note (Signed)
BP Readings from Last 3 Encounters:  01/17/21 124/74  01/14/20 140/76  06/23/19 (!) 160/69   Stable, pt to continue medical treatment and restart the hct 12.5 mg daily   Current Outpatient Medications (Cardiovascular):  .  hydrochlorothiazide (HYDRODIURIL) 12.5 MG tablet, Take 1 tablet (12.5 mg total) by mouth daily.  Current Outpatient Medications (Respiratory):  Marland Kitchen  Fluticasone-Salmeterol (ADVAIR DISKUS IN), Inhale 1 Inhaler into the lungs as needed. 100/50  Current Outpatient Medications (Analgesics):  .  acetaminophen (TYLENOL) 500 MG tablet, Take 500 mg by mouth as needed.   Current Outpatient Medications (Other):  Marland Kitchen  Cholecalciferol (THERA-D 2000) 50 MCG (2000 UT) TABS, 1 tab by mouth once daily .  Coenzyme Q10 (CO Q 10 PO), Take 200 mg by mouth daily. Marland Kitchen  gabapentin (NEURONTIN) 300 MG capsule, TAKE 1 CAPSULE BY MOUTH EVERY MORNING AND TAKE 2 CAPSULES BY MOUTH EVERY EVENING .  memantine (NAMENDA) 10 MG tablet, Take 1 tablet twice a day .  nitrofurantoin (MACRODANTIN) 50 MG capsule, 1 by mouth twice per day as needed .  rivastigmine (EXELON) 3 MG capsule, Take 1 capsule (3 mg total) by mouth 2 (two) times daily. .  solifenacin (VESICARE) 5 MG tablet, TAKE 1 TABLET(5 MG) BY MOUTH DAILY .  CALCIUM PO, Take by mouth as directed. Reported on 01/30/2016 (Patient not taking: Reported on 01/17/2021) .  Cranberry 500 MG CAPS, Take by mouth 2 (two) times daily. (Patient not taking: Reported on 01/17/2021) .  potassium chloride (KLOR-CON 10) 10 MEQ tablet, Take 1 tablet (10 mEq total) by mouth daily for 5 days.

## 2021-01-18 ENCOUNTER — Other Ambulatory Visit (INDEPENDENT_AMBULATORY_CARE_PROVIDER_SITE_OTHER): Payer: Medicare Other

## 2021-01-18 DIAGNOSIS — E559 Vitamin D deficiency, unspecified: Secondary | ICD-10-CM | POA: Diagnosis not present

## 2021-01-18 DIAGNOSIS — N390 Urinary tract infection, site not specified: Secondary | ICD-10-CM | POA: Diagnosis not present

## 2021-01-18 DIAGNOSIS — E538 Deficiency of other specified B group vitamins: Secondary | ICD-10-CM

## 2021-01-18 DIAGNOSIS — E785 Hyperlipidemia, unspecified: Secondary | ICD-10-CM

## 2021-01-18 DIAGNOSIS — R739 Hyperglycemia, unspecified: Secondary | ICD-10-CM

## 2021-01-18 LAB — BASIC METABOLIC PANEL
BUN: 14 mg/dL (ref 6–23)
CO2: 29 mEq/L (ref 19–32)
Calcium: 10 mg/dL (ref 8.4–10.5)
Chloride: 108 mEq/L (ref 96–112)
Creatinine, Ser: 0.81 mg/dL (ref 0.40–1.20)
GFR: 68.2 mL/min (ref >60.00)
Glucose, Bld: 102 mg/dL — ABNORMAL HIGH (ref 70–99)
Potassium: 3.2 mEq/L — ABNORMAL LOW (ref 3.5–5.1)
Sodium: 143 mEq/L (ref 135–145)

## 2021-01-18 LAB — CBC WITH DIFFERENTIAL/PLATELET
Basophils Absolute: 0 10*3/uL (ref 0.0–0.1)
Basophils Relative: 0.3 % (ref 0.0–3.0)
Eosinophils Absolute: 0.1 10*3/uL (ref 0.0–0.7)
Eosinophils Relative: 0.8 % (ref 0.0–5.0)
HCT: 39.2 % (ref 36.0–46.0)
Hemoglobin: 13.3 g/dL (ref 12.0–15.0)
Lymphocytes Relative: 40 % (ref 12.0–46.0)
Lymphs Abs: 2.7 10*3/uL (ref 0.7–4.0)
MCHC: 33.8 g/dL (ref 30.0–36.0)
MCV: 86.1 fl (ref 78.0–100.0)
Monocytes Absolute: 0.6 10*3/uL (ref 0.1–1.0)
Monocytes Relative: 8.7 % (ref 3.0–12.0)
Neutro Abs: 3.4 10*3/uL (ref 1.4–7.7)
Neutrophils Relative %: 50.2 % (ref 43.0–77.0)
Platelets: 135 10*3/uL — ABNORMAL LOW (ref 150.0–400.0)
RBC: 4.56 Mil/uL (ref 3.87–5.11)
RDW: 13.1 % (ref 11.5–15.5)
WBC: 6.9 10*3/uL (ref 4.0–10.5)

## 2021-01-18 LAB — HEPATIC FUNCTION PANEL
ALT: 7 U/L (ref 0–35)
AST: 12 U/L (ref 0–37)
Albumin: 4 g/dL (ref 3.5–5.2)
Alkaline Phosphatase: 36 U/L — ABNORMAL LOW (ref 39–117)
Bilirubin, Direct: 0.2 mg/dL (ref 0.0–0.3)
Total Bilirubin: 1.4 mg/dL — ABNORMAL HIGH (ref 0.2–1.2)
Total Protein: 6.6 g/dL (ref 6.0–8.3)

## 2021-01-18 LAB — LIPID PANEL
Cholesterol: 181 mg/dL (ref 0–200)
HDL: 58.8 mg/dL (ref >39.00)
LDL Cholesterol: 97 mg/dL (ref 0–99)
NonHDL: 122.51
Total CHOL/HDL Ratio: 3
Triglycerides: 128 mg/dL (ref 0.0–149.0)
VLDL: 25.6 mg/dL (ref 0.0–40.0)

## 2021-01-18 LAB — VITAMIN D 25 HYDROXY (VIT D DEFICIENCY, FRACTURES): VITD: 40.53 ng/mL (ref 30.00–100.00)

## 2021-01-18 LAB — TSH: TSH: 0.64 u[IU]/mL (ref 0.35–4.50)

## 2021-01-18 LAB — VITAMIN B12: Vitamin B-12: 299 pg/mL (ref 211–911)

## 2021-01-18 LAB — HEMOGLOBIN A1C: Hgb A1c MFr Bld: 5.7 % (ref 4.6–6.5)

## 2021-01-19 ENCOUNTER — Encounter: Payer: Self-pay | Admitting: Internal Medicine

## 2021-01-19 ENCOUNTER — Other Ambulatory Visit: Payer: Self-pay | Admitting: Internal Medicine

## 2021-01-19 LAB — URINALYSIS, ROUTINE W REFLEX MICROSCOPIC
Bilirubin Urine: NEGATIVE
Hgb urine dipstick: NEGATIVE
Ketones, ur: NEGATIVE
Leukocytes,Ua: NEGATIVE
Nitrite: NEGATIVE
RBC / HPF: NONE SEEN (ref 0–?)
Specific Gravity, Urine: <=1.005 — AB (ref 1.000–1.030)
Total Protein, Urine: NEGATIVE
Urine Glucose: NEGATIVE
Urobilinogen, UA: 0.2 (ref 0.0–1.0)
pH: 6 (ref 5.0–8.0)

## 2021-01-19 MED ORDER — POTASSIUM CHLORIDE ER 10 MEQ PO TBCR: 10.0000 meq | EXTENDED_RELEASE_TABLET | Freq: Every day | ORAL | 3 refills | Status: DC

## 2021-01-20 LAB — URINE CULTURE
MICRO NUMBER:: 11546969
Result:: NO GROWTH
SPECIMEN QUALITY:: ADEQUATE

## 2021-01-20 NOTE — Progress Notes (Signed)
Subjective:   I, Madeline Mcdaniel, LAT, ATC acting as a scribe for Madeline Leader, MD.  I'm seeing this patient as a consultation for Dr. Cathlean Cower. Note will be routed back to referring provider/PCP.  CC: Right shoulder pain  HPI: Pt is a 80 y/o female c/o R shoulder pain that she injured 01/16/21 when becoming off-balance and falling onto R side. Pt unable to ABD R shoulder above 90 w/out increased pain. Pt locates R shoulder pain to anterior aspect of shoulder, but is a bit unsure and unable to pinpoint location of pain.  Radiates: no Mechanical symptoms: yes UE numbness/tingling: no Aggravates: sleeping, ABD over 90 Treatments tried: Tylenol  Past medical history, Surgical history, Family history, Social history, Allergies, and medications have been entered into the medical record, reviewed. Significant for dementia.  Review of Systems: No new headache, visual changes, nausea, vomiting, diarrhea, constipation, dizziness, abdominal pain, skin rash, fevers, chills, night sweats, weight loss, swollen lymph nodes, body aches, joint swelling, muscle aches, chest pain, shortness of breath, mood changes, visual or auditory hallucinations.   Objective:    Vitals:   01/23/21 1415  BP: 110/76  Pulse: (!) 55  SpO2: 99%   General: Well Developed, well nourished, and in no acute distress.  Neuro/Psych: Alert and oriented x3, extra-ocular muscles intact, able to move all 4 extremities, sensation grossly intact. Skin: Warm and dry, no rashes noted.  Respiratory: Not using accessory muscles, speaking in full sentences, trachea midline.  Cardiovascular: Pulses palpable, no extremity edema. Abdomen: Does not appear distended. MSK: Right shoulder normal-appearing Nontender. Range of motion abduction 140 degrees.  External rotation full internal rotation lumbar spine. Strength 4/5 abduction 5/5 internal and external rotation. Positive Hawkins and Neer's test.  Positive empty can test. Negative  Yergason's and speeds test.  Pulses capillary fill and sensation are intact distally.  Lab and Radiology Results  X-ray images right SHOULDER obtained today personally and independently interpreted  Moderate AC DJD.  Mild glenohumeral DJD.  No acute fractures. Await formal radiology review  Diagnostic Limited MSK Ultrasound of: Right shoulder Biceps tendon intact. Subscapularis tendon is intact. Supraspinatus tendon is intact. Mild subacromial bursitis present. Infraspinatus tendon is intact. AC joint degenerative with mild effusion Impression: Subacromial bursitis.  AC DJD.   Impression and Recommendations:    Assessment and Plan: 80 y.o. female with right shoulder pain after fall.  Subacromial bursitis main cause of pain.  Plan for physical therapy.  Will use home health PT given transportation difficulties secondary to dementia.  Recommend also Voltaren gel.  Check back in 6 weeks.  If not better consider injection.Marland Kitchen  PDMP not reviewed this encounter. Orders Placed This Encounter  Procedures  . [place] LIMITED JOINT SPACE STRUCTURES UP RIGHT(NO LINKED CHARGES)    Standing Status:   Future    Number of Occurrences:   1    Standing Expiration Date:   07/23/2021    Order Specific Question:   Reason for Exam (SYMPTOM  OR DIAGNOSIS REQUIRED)    Answer:   right shoulder pain    Order Specific Question:   Preferred imaging location?    Answer:   [place]  . DG Shoulder Right    Standing Status:   Future    Number of Occurrences:   1    Standing Expiration Date:   01/23/2022    Order Specific Question:   Reason for Exam (SYMPTOM  OR DIAGNOSIS REQUIRED)    Answer:   right shoulder  pain    Order Specific Question:   Preferred imaging location?    Answer:   [facility]  . Ambulatory referral to Physical Therapy    Referral Priority:   Routine    Referral Type:   Physical Medicine    Referral Reason:   Specialty Services Required    Requested  Specialty:   Physical Therapy   No orders of the defined types were placed in this encounter.   Discussed warning signs or symptoms. Please see discharge instructions. Patient expresses understanding.   The above documentation has been reviewed and is accurate and complete Madeline Mcdaniel, M.D.

## 2021-01-23 ENCOUNTER — Ambulatory Visit (INDEPENDENT_AMBULATORY_CARE_PROVIDER_SITE_OTHER): Payer: Medicare Other

## 2021-01-23 ENCOUNTER — Other Ambulatory Visit: Payer: Self-pay

## 2021-01-23 ENCOUNTER — Ambulatory Visit (INDEPENDENT_AMBULATORY_CARE_PROVIDER_SITE_OTHER): Payer: Medicare Other | Admitting: Family Medicine

## 2021-01-23 ENCOUNTER — Ambulatory Visit: Payer: Self-pay

## 2021-01-23 VITALS — BP 110/76 | HR 55 | Ht <= 58 in | Wt 95.0 lb

## 2021-01-23 DIAGNOSIS — M25511 Pain in right shoulder: Secondary | ICD-10-CM

## 2021-01-23 NOTE — Patient Instructions (Addendum)
Thank you for coming in today.  I've referred you to Physical Therapy.  Let us know if you don't hear from them in one week.  Please get an Xray today before you leave  Recheck in about 6 weeks.   Please use voltaren gel up to 4x daily for pain as needed.

## 2021-01-24 ENCOUNTER — Encounter: Payer: Self-pay | Admitting: Internal Medicine

## 2021-01-24 NOTE — Progress Notes (Signed)
Right shoulder x-ray is normal appearing to radiology

## 2021-01-31 ENCOUNTER — Encounter: Payer: Self-pay | Admitting: Physical Therapy

## 2021-01-31 ENCOUNTER — Other Ambulatory Visit: Payer: Self-pay

## 2021-01-31 ENCOUNTER — Ambulatory Visit: Payer: Medicare Other | Attending: Family Medicine | Admitting: Physical Therapy

## 2021-01-31 DIAGNOSIS — R42 Dizziness and giddiness: Secondary | ICD-10-CM | POA: Diagnosis not present

## 2021-01-31 DIAGNOSIS — R2689 Other abnormalities of gait and mobility: Secondary | ICD-10-CM | POA: Diagnosis not present

## 2021-01-31 DIAGNOSIS — R2681 Unsteadiness on feet: Secondary | ICD-10-CM | POA: Diagnosis not present

## 2021-01-31 DIAGNOSIS — M6281 Muscle weakness (generalized): Secondary | ICD-10-CM | POA: Diagnosis not present

## 2021-01-31 DIAGNOSIS — M25511 Pain in right shoulder: Secondary | ICD-10-CM | POA: Insufficient documentation

## 2021-01-31 DIAGNOSIS — R296 Repeated falls: Secondary | ICD-10-CM | POA: Insufficient documentation

## 2021-01-31 NOTE — Therapy (Signed)
[facility] [address]  [place], [place], [address] Phone: [phone]   Fax:  [phone]  Physical Therapy Evaluation  Patient Details  Name: Madeline Mcdaniel MRN: [id number] Date of Birth: 20-Sep-1941 Referring Provider (PT): Lynne Leader, MD & Cathlean Cower, MD   Encounter Date: 01/31/2021   PT End of Session - 01/31/21 1208    Visit Number 1    Number of Visits 13    Date for PT Re-Evaluation 03/14/21    Authorization Type Medicare & Dory Horn    PT Start Time 7616    PT Stop Time 0933    PT Time Calculation (min) 42 min    Activity Tolerance Patient tolerated treatment well;Patient limited by pain    Behavior During Therapy [facility] for tasks assessed/performed           Past Medical History:  Diagnosis Date  . Allergic rhinitis, cause unspecified 04/27/2011  . Anemia 04/27/2011  . Asthma 04/27/2011  . Bipolar affective disorder ([place]) 04/27/2011  . Cervical spondylosis 06/01/2012  . Chronic bronchitis 04/27/2011  . Chronic cystitis 04/27/2011  . Colon polyps 04/27/2011  . Degenerative joint disease 04/27/2011  . GERD (gastroesophageal reflux disease) 04/27/2011  . HTN (hypertension) 04/27/2011  . Hyperlipidemia 04/27/2011  . Mild cognitive impairment 04/12/2013  . Osteopenia 06/01/2012  . 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 Right carpal tunnel syndrome 07/22/2012  . TIA (transient ischemic attack) 04/27/2011  . Urinary incontinence 04/27/2011    Past Surgical History:  Procedure Laterality Date  . BLADDER SUSPENSION     A&P REPAIR WITH MESH  . BREAST BIOPSY    . BREAST LUMPECTOMY     SEVERAL TIMES  . BUNIONECTOMY     LEFT FOOT  . CHOLECYSTECTOMY    . COLONOSCOPY  67YRS AGO   IN [place],PT DOES NOT REMEMBER DOCTOR  . Lauro Regulus Bunionectomy Left 09/09/2014   @ [facility]  . NEURECTOMY FOOT Left 09/09/2014   @ [place]  . ORTHOSCOPIC CARPETOMY     BOTH THUMBS  . POLYPECTOMY  10 YRS AGO   BENIGN POLYPS X2  .  STERIOTACTIC BREAST BIOPSY     RIGHT  . TENDON TRANSFER  12/09/2012   Procedure: TENDON TRANSFER;  Surgeon: Wynonia Sours, MD;  Location: [facility];  Service: Orthopedics;  Laterality: Left;  Tenodesis PIP with FDS Slip Left ring finger, Juggerknot  . TONSILLECTOMY    . TOTAL ABDOMINAL HYSTERECTOMY    . TRIGGER FINGER RELEASE     LEFT RING FINGER    There were no vitals filed for this visit.    Subjective Assessment - 01/31/21 0852    Subjective Patient present with husband for subjective report. Patient reports that she fell and hurt her R shoulder. Husband reports that she fell in the gym from catching her foot on the floor. Had another fall in the bedroom and got her arm caught behind the frame of the bed. Shoulder pain occurs along the upper arm. Denies N/T. Husband reports that pain occurs when rising into abduction and R sidelying. Reports no AD use. Patient unable to describe what makes her unsteady, but notes dizziness when standing up from a chair and getting out of bed. Episodes last minutes. Describes it as woozy. Dizziness has been present for months    Patient is accompained by: Family member   husband   Pertinent History TIA 2012, R CTS, osteopenia, mild cognitive impairment, HLD, HTN, GERD, bipolar affective  disorder, asthma, anemia, breast lumpectomy, L bunionectomy & neurectomy    Limitations Walking;Standing;Lifting;Sitting;House hold activities    Diagnostic tests 01/23/21 R shoulder xray: Negative.    Patient Stated Goals decrease pain and improve balance    Currently in Pain? Yes    Pain Score 5     Pain Location Arm    Pain Orientation Right    Pain Descriptors / Indicators Aching    Pain Type Acute pain              [facility] PT Assessment - 01/31/21 0900      Assessment   Medical Diagnosis Acute pain of R shoulder, recurrent falls    Referring Provider (PT) Lynne Leader, MD & Cathlean Cower, MD    Onset Date/Surgical Date 01/16/21    Hand Dominance  Right    Next MD Visit 03/06/21    Prior Therapy yes      Precautions   Precautions Fall   hx dementia     Balance Screen   Has the patient fallen in the past 6 months Yes    How many times? 4    Has the patient had a decrease in activity level because of a fear of falling?  No    Is the patient reluctant to leave their home because of a fear of falling?  No      Home Social worker Private residence    Living Arrangements Spouse/significant other    Available Help at Discharge Family    Type of [place] to enter    Entrance Stairs-Number of Steps 1    Entrance Stairs-Rails None    Home Layout Two level;Able to live on main level with bedroom/bathroom    Alternate Level Stairs-Number of Steps 16    Alternate Level Stairs-Rails Right    Home Equipment Wheelchair - manual;Shower seat      Prior Function   Level of Independence Independent with basic ADLs;Independent with household mobility without device    Vocation Retired    Leisure none      Cognition   Overall Cognitive Status History of cognitive impairments - at baseline      Sensation   Light Touch Appears Intact      Coordination   Gross Motor Movements are Fluid and Coordinated Yes      Posture/Postural Control   Posture/Postural Control Postural limitations    Postural Limitations Rounded Shoulders;Forward head;Increased thoracic kyphosis      ROM / Strength   AROM / PROM / Strength AROM;Strength      AROM   AROM Assessment Site Shoulder    Right/Left Shoulder Left;Right    Right Shoulder Flexion 129 Degrees    Right Shoulder ABduction 136 Degrees    Right Shoulder Internal Rotation --   FIR T11   Right Shoulder External Rotation --   FER T3; mild pain   Left Shoulder Flexion 144 Degrees    Left Shoulder ABduction 171 Degrees    Left Shoulder Internal Rotation --   FIR T10   Left Shoulder External Rotation --   FER T3     Strength   Strength Assessment Site  Shoulder    Right/Left Shoulder Right;Left    Right Shoulder Flexion 4+/5    Right Shoulder ABduction 4/5    Right Shoulder Internal Rotation 4/5    Right Shoulder External Rotation 4/5    Left Shoulder Flexion 4+/5    Left  Shoulder ABduction 4+/5    Left Shoulder Internal Rotation 4/5    Left Shoulder External Rotation 4/5      Palpation   Palpation comment TTP and soft tissue restriction over R infraspinatus/teres group, posterior deltoid, and proximal biceps tendon      Ambulation/Gait   Assistive device None    Gait Pattern Step-through pattern;Step-to pattern;Decreased step length - right;Decreased step length - left;Trunk flexed;Decreased hip/knee flexion - left;Decreased hip/knee flexion - right    Ambulation Surface Level;Indoor    Gait velocity decreased                      Objective measurements completed on examination: See above findings.               PT Education - 01/31/21 1208    Education Details prognosis, POC, HEP; edu on benefits of vestibular rehab for dizziness    Person(s) Educated Patient;Spouse    Methods Explanation;Demonstration;Tactile cues;Verbal cues;Handout    Comprehension Verbalized understanding;Returned demonstration            PT Short Term Goals - 01/31/21 1214      PT SHORT TERM GOAL #1   Title Patient to be independent with initial HEP.    Time 3    Period Weeks    Status New    Target Date 02/21/21             PT Long Term Goals - 01/31/21 1214      PT LONG TERM GOAL #1   Title Patient to be independent with advanced HEP.    Time 6    Period Weeks    Status New    Target Date 03/14/21      PT LONG TERM GOAL #2   Title Patient to demonstrate R shoulder AROM WFL and without pain limiting.    Time 6    Period Weeks    Status New    Target Date 03/14/21      PT LONG TERM GOAL #3   Title Patient to demonstrate R shoulder strength >/=4+/5.    Time 6    Period Weeks    Status New    Target  Date 03/14/21      PT LONG TERM GOAL #4   Title Patient to score atleast 47/56 on berg in order to decrease risk of falls.    Time 6    Period Weeks    Status New    Target Date 03/14/21      PT LONG TERM GOAL #5   Title Patient to report no dizziness with transfers or bed mobility.    Time 6    Period Weeks    Status New    Target Date 03/14/21                  Plan - 01/31/21 1209    Clinical Impression Statement Patient is an 80y/o F presenting to [facility] with husband with c/o R shoulder pain for the past 2 weeks after a fall occurring on 01/16/21. Pain occurs over the R upper arm and worse with abduction and R sidelying. Patient does not ambulate with an AD and unable to specify what makes her unsteady, but does report dizziness with STS and supine>sit transfers. Husband reports that the patient has had 4 falls in the past 6 months. Patient today presenting with rounded and kyphotic posture, limited R shoulder AROM, decreased B shoulder strength, R>L, TTP and soft tissue  restriction over R infraspinatus/teres group, posterior deltoid, and proximal biceps tendon, and gait deviations. Patient reported dizziness upon laying in L sidelying during assessment lasting ~20 seconds, with indescribable nystagmus present. Plan for further balance and dizziness assessment next session. Patient was educated on gentle ROM and postural correction HEP- patient reported understanding. Would benefit from skilled PT services 2x/week for 6 weeks to address aforementioned impairments.    Personal Factors and Comorbidities Age;Comorbidity 3+;Fitness;Past/Current Experience;Time since onset of injury/illness/exacerbation;Transportation    Comorbidities TIA 2012, R CTS, osteopenia, mild cognitive impairment, HLD, HTN, GERD, bipolar affective disorder, asthma, anemia, breast lumpectomy, L bunionectomy & neurectomy    Examination-Activity Limitations Bathing;Sleep;Squat;Bed  Mobility;Bend;Stairs;Stand;Carry;Transfers;Dressing;Hygiene/Grooming;Lift;Locomotion Level;Reach Overhead;Self Feeding;Sit    Examination-Participation Restrictions Church;Cleaning;School;Shop;Meal Prep    Stability/Clinical Decision Making Stable/Uncomplicated    Clinical Decision Making Low    Rehab Potential Good    PT Frequency 2x / week    PT Duration 6 weeks    PT Treatment/Interventions ADLs/Self Care Home Management;Canalith Repostioning;Cryotherapy;Electrical Stimulation;Iontophoresis 4mg /ml Dexamethasone;Moist Heat;Balance training;Therapeutic exercise;Therapeutic activities;Functional mobility training;Stair training;Gait training;DME Instruction;Ultrasound;Neuromuscular re-education;Cognitive remediation;Patient/family education;Manual techniques;Vestibular;Vasopneumatic Device;Taping;Energy conservation;Dry needling;Passive range of motion    PT Next Visit Plan reassess HEP; vestibular assessment; berg    Consulted and Agree with Plan of Care Patient;Family member/caregiver    Family Member Consulted husband           Patient will benefit from skilled therapeutic intervention in order to improve the following deficits and impairments:  Abnormal gait,Decreased activity tolerance,Decreased strength,Increased fascial restricitons,Impaired UE functional use,Pain,Decreased balance,Difficulty walking,Increased muscle spasms,Improper body mechanics,Dizziness,Decreased range of motion,Decreased safety awareness,Postural dysfunction,Impaired flexibility  Visit Diagnosis: Acute pain of right shoulder  Unsteadiness on feet  Dizziness and giddiness  Repeated falls  Other abnormalities of gait and mobility  Muscle weakness (generalized)     Problem List Patient Active Problem List   Diagnosis Date Noted  . Recurrent falls 01/17/2021  . Right shoulder pain 01/17/2021  . Vitamin D deficiency 01/17/2021  . 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  .  Dementia without behavioral disturbance ([place]) 01/12/2019  . Lumbar radiculopathy 01/06/2019  . Left hip pain 12/22/2018  . Memory dysfunction 01/30/2017  . Urinary frequency 01/19/2016  . Loss of weight 04/19/2015  . Dyspnea 04/06/2015  . Edema 12/28/2014  . Onychomycosis 08/31/2014  . Pain in lower limb 08/31/2014  . 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  . Carpal tunnel syndrome, right 08/25/2014  . Bilateral bunions 08/25/2014  . Left foot pain 08/25/2013  . Fatigue 04/08/2013  . Right carpal tunnel syndrome 07/22/2012  . Brachial neuritis or radiculitis 07/01/2012  . Trigger finger, left 07/01/2012  . Osteopenia 06/01/2012  . Cervical spondylosis 06/01/2012  . Mild intermittent asthma 04/27/2011  . Degenerative joint disease 04/27/2011  . Bipolar affective disorder ([place]) 04/27/2011  . Chronic bronchitis 04/27/2011  . GERD (gastroesophageal reflux disease) 04/27/2011  . Allergic rhinitis, cause unspecified 04/27/2011  . HTN (hypertension) 04/27/2011  . Chronic cystitis 04/27/2011  . Colon polyps 04/27/2011  . Anemia 04/27/2011  . H/O: hysterectomy 04/27/2011  . TIA (transient ischemic attack) 04/27/2011  . Urinary incontinence 04/27/2011  . 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 Hyperlipidemia 04/27/2011  . Preventative health care 04/27/2011     Janene Harvey, PT, DPT 01/31/21 12:18 PM   [facility] [address]  [place], [place], [address] Phone: [phone]   Fax:  [phone]  Name: Madeline Mcdaniel MRN: [id number] Date of Birth: December 21, 1940

## 2021-02-03 ENCOUNTER — Ambulatory Visit: Payer: Medicare Other

## 2021-02-03 ENCOUNTER — Other Ambulatory Visit: Payer: Self-pay

## 2021-02-03 DIAGNOSIS — R42 Dizziness and giddiness: Secondary | ICD-10-CM | POA: Diagnosis not present

## 2021-02-03 DIAGNOSIS — M25511 Pain in right shoulder: Secondary | ICD-10-CM

## 2021-02-03 DIAGNOSIS — R2681 Unsteadiness on feet: Secondary | ICD-10-CM

## 2021-02-03 DIAGNOSIS — R296 Repeated falls: Secondary | ICD-10-CM

## 2021-02-03 DIAGNOSIS — M6281 Muscle weakness (generalized): Secondary | ICD-10-CM | POA: Diagnosis not present

## 2021-02-03 DIAGNOSIS — R2689 Other abnormalities of gait and mobility: Secondary | ICD-10-CM | POA: Diagnosis not present

## 2021-02-03 NOTE — Therapy (Signed)
[facility] [address]  [place], [place], [address] Phone: [phone]   Fax:  [phone]  Physical Therapy Treatment  Patient Details  Name: Madeline Mcdaniel MRN: [id number] Date of Birth: 27-Nov-1941 Referring Provider (PT): Lynne Leader, MD & Cathlean Cower, MD   Encounter Date: 02/03/2021   PT End of Session - 02/03/21 1030    Visit Number 2    Number of Visits 13    Date for PT Re-Evaluation 03/14/21    Authorization Type Medicare & Dory Horn    PT Start Time 0932    PT Stop Time 1012    PT Time Calculation (min) 40 min    Activity Tolerance Patient tolerated treatment well    Behavior During Therapy [facility] for tasks assessed/performed           Past Medical History:  Diagnosis Date  . Allergic rhinitis, cause unspecified 04/27/2011  . Anemia 04/27/2011  . Asthma 04/27/2011  . Bipolar affective disorder ([place]) 04/27/2011  . Cervical spondylosis 06/01/2012  . Chronic bronchitis 04/27/2011  . Chronic cystitis 04/27/2011  . Colon polyps 04/27/2011  . Degenerative joint disease 04/27/2011  . GERD (gastroesophageal reflux disease) 04/27/2011  . HTN (hypertension) 04/27/2011  . Hyperlipidemia 04/27/2011  . Mild cognitive impairment 04/12/2013  . Osteopenia 06/01/2012  . 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 Right carpal tunnel syndrome 07/22/2012  . TIA (transient ischemic attack) 04/27/2011  . Urinary incontinence 04/27/2011    Past Surgical History:  Procedure Laterality Date  . BLADDER SUSPENSION     A&P REPAIR WITH MESH  . BREAST BIOPSY    . BREAST LUMPECTOMY     SEVERAL TIMES  . BUNIONECTOMY     LEFT FOOT  . CHOLECYSTECTOMY    . COLONOSCOPY  28YRS AGO   IN [place],PT DOES NOT REMEMBER DOCTOR  . Lauro Regulus Bunionectomy Left 09/09/2014   @ [facility]  . NEURECTOMY FOOT Left 09/09/2014   @ [place]  . ORTHOSCOPIC CARPETOMY     BOTH THUMBS  . POLYPECTOMY  10 YRS AGO   BENIGN POLYPS X2  . STERIOTACTIC BREAST BIOPSY      RIGHT  . TENDON TRANSFER  12/09/2012   Procedure: TENDON TRANSFER;  Surgeon: Wynonia Sours, MD;  Location: [facility];  Service: Orthopedics;  Laterality: Left;  Tenodesis PIP with FDS Slip Left ring finger, Juggerknot  . TONSILLECTOMY    . TOTAL ABDOMINAL HYSTERECTOMY    . TRIGGER FINGER RELEASE     LEFT RING FINGER    There were no vitals filed for this visit.   Subjective Assessment - 02/03/21 0935    Subjective Pt reports she is having trouble reaching out to the side,    Patient is accompained by: Family member    Pertinent History TIA 2012, R CTS, osteopenia, mild cognitive impairment, HLD, HTN, GERD, bipolar affective disorder, asthma, anemia, breast lumpectomy, L bunionectomy & neurectomy    Diagnostic tests 01/23/21 R shoulder xray: Negative.    Patient Stated Goals decrease pain and improve balance    Currently in Pain? Yes    Pain Score 5     Pain Location Arm    Pain Orientation Right    Pain Descriptors / Indicators Aching    Pain Type Acute pain                             [facility] Adult PT Treatment/Exercise -  02/03/21 0001      Ambulation/Gait   Ambulation/Gait Yes    Ambulation Distance (Feet) 90 Feet    Gait Pattern Step-to pattern;Decreased step length - right;Decreased step length - left      High Level Balance   High Level Balance Activities Head turns   trunk rotations, marches from airex, 10 reps each walking with head turns 30 ft 2 bouts     Exercises   Exercises Shoulder      Shoulder Exercises: Supine   Flexion AAROM;Both;10 reps    Flexion Limitations cane    ABduction AAROM;Both;10 reps    ABduction Limitations cane      Shoulder Exercises: Seated   Flexion AAROM;Both;15 reps    Flexion Limitations L hand assist                    PT Short Term Goals - 01/31/21 1214      PT SHORT TERM GOAL #1   Title Patient to be independent with initial HEP.    Time 3    Period Weeks    Status New    Target  Date 02/21/21             PT Long Term Goals - 01/31/21 1214      PT LONG TERM GOAL #1   Title Patient to be independent with advanced HEP.    Time 6    Period Weeks    Status New    Target Date 03/14/21      PT LONG TERM GOAL #2   Title Patient to demonstrate R shoulder AROM WFL and without pain limiting.    Time 6    Period Weeks    Status New    Target Date 03/14/21      PT LONG TERM GOAL #3   Title Patient to demonstrate R shoulder strength >/=4+/5.    Time 6    Period Weeks    Status New    Target Date 03/14/21      PT LONG TERM GOAL #4   Title Patient to score atleast 47/56 on berg in order to decrease risk of falls.    Time 6    Period Weeks    Status New    Target Date 03/14/21      PT LONG TERM GOAL #5   Title Patient to report no dizziness with transfers or bed mobility.    Time 6    Period Weeks    Status New    Target Date 03/14/21                 Plan - 02/03/21 1035    Clinical Impression Statement Pt responded well to treatment, updated HEP to dowel exercises in supine to help with shoulder ROM. Did some balance exercises with her, she was unsteady when turning her head and rotating her trunk. Also did gait training with her, she did have B decreased step length and decreased hip/knee flexion, cues to keep upright posture during gait and to increase step length.    Personal Factors and Comorbidities Age;Comorbidity 3+;Fitness;Past/Current Experience;Time since onset of injury/illness/exacerbation;Transportation    Comorbidities TIA 2012, R CTS, osteopenia, mild cognitive impairment, HLD, HTN, GERD, bipolar affective disorder, asthma, anemia, breast lumpectomy, L bunionectomy & neurectomy    PT Frequency 2x / week    PT Duration 6 weeks    PT Treatment/Interventions ADLs/Self Care Home Management;Canalith Repostioning;Cryotherapy;Electrical Stimulation;Iontophoresis 4mg /ml Dexamethasone;Moist Heat;Balance training;Therapeutic  exercise;Therapeutic activities;Functional mobility training;Stair  training;Gait training;DME Instruction;Ultrasound;Neuromuscular re-education;Cognitive remediation;Patient/family education;Manual techniques;Vestibular;Vasopneumatic Device;Taping;Energy conservation;Dry needling;Passive range of motion    PT Next Visit Plan vestibular assessment; berg; balance training    Consulted and Agree with Plan of Care Patient;Family member/caregiver    Family Member Consulted husband           Patient will benefit from skilled therapeutic intervention in order to improve the following deficits and impairments:  Abnormal gait,Decreased activity tolerance,Decreased strength,Increased fascial restricitons,Impaired UE functional use,Pain,Decreased balance,Difficulty walking,Increased muscle spasms,Improper body mechanics,Dizziness,Decreased range of motion,Decreased safety awareness,Postural dysfunction,Impaired flexibility  Visit Diagnosis: Acute pain of right shoulder  Unsteadiness on feet  Dizziness and giddiness  Repeated falls     Problem List Patient Active Problem List   Diagnosis Date Noted  . Recurrent falls 01/17/2021  . Right shoulder pain 01/17/2021  . Vitamin D deficiency 01/17/2021  . 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  . Dementia without behavioral disturbance ([place]) 01/12/2019  . Lumbar radiculopathy 01/06/2019  . Left hip pain 12/22/2018  . Memory dysfunction 01/30/2017  . Urinary frequency 01/19/2016  . Loss of weight 04/19/2015  . Dyspnea 04/06/2015  . Edema 12/28/2014  . Onychomycosis 08/31/2014  . Pain in lower limb 08/31/2014  . 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  . Carpal tunnel syndrome, right 08/25/2014  . Bilateral bunions 08/25/2014  . Left foot pain 08/25/2013  . Fatigue 04/08/2013  . Right carpal tunnel syndrome 07/22/2012  . Brachial neuritis or radiculitis 07/01/2012  . Trigger finger, left 07/01/2012  . Osteopenia  06/01/2012  . Cervical spondylosis 06/01/2012  . Mild intermittent asthma 04/27/2011  . Degenerative joint disease 04/27/2011  . Bipolar affective disorder ([place]) 04/27/2011  . Chronic bronchitis 04/27/2011  . GERD (gastroesophageal reflux disease) 04/27/2011  . Allergic rhinitis, cause unspecified 04/27/2011  . HTN (hypertension) 04/27/2011  . Chronic cystitis 04/27/2011  . Colon polyps 04/27/2011  . Anemia 04/27/2011  . H/O: hysterectomy 04/27/2011  . TIA (transient ischemic attack) 04/27/2011  . Urinary incontinence 04/27/2011  . 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 Hyperlipidemia 04/27/2011  . Preventative health care 04/27/2011    Artist Pais, PTA 02/03/2021, 11:02 AM  [facility] [address]  [place], [place], [address] Phone: [phone]   Fax:  [phone]  Name: VILMA WILL MRN: [id number] Date of Birth: 12/24/1940

## 2021-02-03 NOTE — Patient Instructions (Signed)
Access Code: [email or web address] URL: [email or web address] Date: 02/03/2021 Prepared by: Clarene Essex  Exercises Supine Shoulder Flexion Extension AAROM with Dowel - 1 x daily - 7 x weekly - 2 sets - 10 reps Supine Shoulder Abduction AAROM with Dowel - 1 x daily - 7 x weekly - 2 sets - 10 reps

## 2021-02-06 ENCOUNTER — Other Ambulatory Visit: Payer: Self-pay | Admitting: Internal Medicine

## 2021-02-07 ENCOUNTER — Ambulatory Visit: Payer: Medicare Other | Admitting: Physical Therapy

## 2021-02-07 ENCOUNTER — Other Ambulatory Visit: Payer: Self-pay

## 2021-02-07 ENCOUNTER — Encounter: Payer: Self-pay | Admitting: Physical Therapy

## 2021-02-07 DIAGNOSIS — R2689 Other abnormalities of gait and mobility: Secondary | ICD-10-CM

## 2021-02-07 DIAGNOSIS — R2681 Unsteadiness on feet: Secondary | ICD-10-CM | POA: Diagnosis not present

## 2021-02-07 DIAGNOSIS — R296 Repeated falls: Secondary | ICD-10-CM | POA: Diagnosis not present

## 2021-02-07 DIAGNOSIS — R42 Dizziness and giddiness: Secondary | ICD-10-CM | POA: Diagnosis not present

## 2021-02-07 DIAGNOSIS — M6281 Muscle weakness (generalized): Secondary | ICD-10-CM

## 2021-02-07 DIAGNOSIS — M25511 Pain in right shoulder: Secondary | ICD-10-CM

## 2021-02-07 NOTE — Therapy (Signed)
[facility] [address]  [place], [place], [address] Phone: [phone]   Fax:  [phone]  Physical Therapy Treatment  Patient Details  Name: Madeline Mcdaniel MRN: [id number] Date of Birth: 23-Aug-1941 Referring Provider (PT): Lynne Leader, MD & Cathlean Cower, MD   Encounter Date: 02/07/2021   PT End of Session - 02/07/21 1627    Visit Number 3    Number of Visits 13    Date for PT Re-Evaluation 03/14/21    Authorization Type Medicare & Dory Horn    PT Start Time 1401    PT Stop Time 1450    PT Time Calculation (min) 49 min    Equipment Utilized During Treatment Gait belt    Activity Tolerance Patient tolerated treatment well    Behavior During Therapy [facility] for tasks assessed/performed           Past Medical History:  Diagnosis Date  . Allergic rhinitis, cause unspecified 04/27/2011  . Anemia 04/27/2011  . Asthma 04/27/2011  . Bipolar affective disorder ([place]) 04/27/2011  . Cervical spondylosis 06/01/2012  . Chronic bronchitis 04/27/2011  . Chronic cystitis 04/27/2011  . Colon polyps 04/27/2011  . Degenerative joint disease 04/27/2011  . GERD (gastroesophageal reflux disease) 04/27/2011  . HTN (hypertension) 04/27/2011  . Hyperlipidemia 04/27/2011  . Mild cognitive impairment 04/12/2013  . Osteopenia 06/01/2012  . 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 Right carpal tunnel syndrome 07/22/2012  . TIA (transient ischemic attack) 04/27/2011  . Urinary incontinence 04/27/2011    Past Surgical History:  Procedure Laterality Date  . BLADDER SUSPENSION     A&P REPAIR WITH MESH  . BREAST BIOPSY    . BREAST LUMPECTOMY     SEVERAL TIMES  . BUNIONECTOMY     LEFT FOOT  . CHOLECYSTECTOMY    . COLONOSCOPY  31YRS AGO   IN [place],PT DOES NOT REMEMBER DOCTOR  . Lauro Regulus Bunionectomy Left 09/09/2014   @ [facility]  . NEURECTOMY FOOT Left 09/09/2014   @ [facility]  . ORTHOSCOPIC CARPETOMY     BOTH THUMBS  . POLYPECTOMY  10 YRS AGO   BENIGN  POLYPS X2  . STERIOTACTIC BREAST BIOPSY     RIGHT  . TENDON TRANSFER  12/09/2012   Procedure: TENDON TRANSFER;  Surgeon: Wynonia Sours, MD;  Location: [facility];  Service: Orthopedics;  Laterality: Left;  Tenodesis PIP with FDS Slip Left ring finger, Juggerknot  . TONSILLECTOMY    . TOTAL ABDOMINAL HYSTERECTOMY    . TRIGGER FINGER RELEASE     LEFT RING FINGER    There were no vitals filed for this visit.   Subjective Assessment - 02/07/21 1404    Subjective Has been working on her HEP with her husband and they are going okay.    Patient is accompained by: Family member   husband   Pertinent History TIA 2012, R CTS, osteopenia, mild cognitive impairment, HLD, HTN, GERD, bipolar affective disorder, asthma, anemia, breast lumpectomy, L bunionectomy & neurectomy    Diagnostic tests 01/23/21 R shoulder xray: Negative.    Patient Stated Goals decrease pain and improve balance    Currently in Pain? No/denies              [facility] PT Assessment - 02/07/21 0001      Standardized Balance Assessment   Standardized Balance Assessment Berg Balance Test      Berg Balance Test   Sit to Stand Able to stand without using  hands and stabilize independently    Standing Unsupported Able to stand safely 2 minutes    Sitting with Back Unsupported but Feet Supported on Floor or Stool Able to sit safely and securely 2 minutes    Stand to Sit Sits safely with minimal use of hands    Transfers Able to transfer safely, minor use of hands    Standing Unsupported with Eyes Closed Able to stand 10 seconds safely    Standing Unsupported with Feet Together Able to place feet together independently and stand 1 minute safely    From Standing, Reach Forward with Outstretched Arm Can reach forward >12 cm safely (5")    From Standing Position, Pick up Object from Floor Able to pick up shoe safely and easily    From Standing Position, Turn to Look Behind Over each Shoulder Turn sideways only but  maintains balance    Turn 360 Degrees Able to turn 360 degrees safely but slowly    Standing Unsupported, Alternately Place Feet on Step/Stool Able to stand independently and complete 8 steps >20 seconds    Standing Unsupported, One Foot in [employer] balance while stepping or standing    Standing on One Leg Able to lift leg independently and hold equal to or more than 3 seconds    Total Score 44               Vestibular Assessment - 02/07/21 0001      Symptom Behavior   Type of Dizziness  Spinning    Frequency of Dizziness --   daily when sitting up out of bed   Aggravating Factors Supine to sit    Relieving Factors Head stationary      Positional Testing   Dix-Hallpike Dix-Hallpike Left    Sidelying Test Sidelying Right;Sidelying Left      Dix-Hallpike Left   Dix-Hallpike Left Duration 5-10 sec    Dix-Hallpike Left Symptoms Upbeat, left rotatory nystagmus      Sidelying Right   Sidelying Right Symptoms No nystagmus   c/o mild dizziness     Sidelying Left   Sidelying Left Duration 5 sec    Sidelying Left Symptoms Upbeat, left rotatory nystagmus                    [facility] Adult PT Treatment/Exercise - 02/07/21 0001      Ambulation/Gait   Ambulation Distance (Feet) 90 Feet    Assistive device Straight cane    Gait Pattern Step-to pattern;Decreased step length - right;Decreased step length - left    Ambulation Surface Level;Indoor    Gait velocity decreased    Gait Comments education, cueing, and demonstration for proper SPC sequencing; patient demonstrating generalized unsteadiness/trunk ataxia throughout and requiring frequent cueing to correct gait pattern      Shoulder Exercises: ROM/Strengthening   Nustep L3 x 6 min (UEs/LEs)           Vestibular Treatment/Exercise - 02/07/21 0001      Vestibular Treatment/Exercise   Vestibular Treatment Provided Canalith Repositioning    Canalith Repositioning Epley Manuever Left       EPLEY MANUEVER LEFT    Number of Reps  1    Overall Response  Improved Symptoms     RESPONSE DETAILS LEFT pt tolerated well throughout                 PT Education - 02/07/21 1626    Education Details discussion on Berg test results; edu on post-Epley instructions  and typical treatment course for BPPV    Person(s) Educated Patient;Spouse    Methods Explanation;Demonstration;Tactile cues;Verbal cues;Handout    Comprehension Verbalized understanding;Returned demonstration            PT Short Term Goals - 02/07/21 1628      PT SHORT TERM GOAL #1   Title Patient to be independent with initial HEP.    Time 3    Period Weeks    Status On-going    Target Date 02/21/21             PT Long Term Goals - 02/07/21 1628      PT LONG TERM GOAL #1   Title Patient to be independent with advanced HEP.    Time 6    Period Weeks    Status On-going      PT LONG TERM GOAL #2   Title Patient to demonstrate R shoulder AROM WFL and without pain limiting.    Time 6    Period Weeks    Status On-going      PT LONG TERM GOAL #3   Title Patient to demonstrate R shoulder strength >/=4+/5.    Time 6    Period Weeks    Status On-going      PT LONG TERM GOAL #4   Title Patient to score atleast 47/56 on berg in order to decrease risk of falls.    Time 6    Period Weeks    Status On-going      PT LONG TERM GOAL #5   Title Patient to report no dizziness with transfers or bed mobility.    Time 6    Period Weeks    Status On-going                 Plan - 02/07/21 1627    Clinical Impression Statement Patient without new complaints today. Reports HEP compliance and denies issues. Patient arrived to session with [facility], however with inconsistent use of AD. Adjusted SPC to more proper height and worked on gait training, providing cueing for [facility] sequencing as patient frequently losing track of proper pattern.  Patient scored 44/56 on Berg today, indicating increased risk of falls. Most challenge was  demonstrated with narrow BOS and with turning activities. Patient still with report of dizziness, worse when sitting up from bed. Patient demonstrated positive L sidelying test and L Dix Hallpike, with upbeating L torsional nystagmus present. Treated patient with L Epley, which was well-tolerated. Patient and husband were educated on post-Epley instructions and typical treatment course for BPPV. Both reported understanding and without complaints at end of session.    Personal Factors and Comorbidities Age;Comorbidity 3+;Fitness;Past/Current Experience;Time since onset of injury/illness/exacerbation;Transportation    Comorbidities TIA 2012, R CTS, osteopenia, mild cognitive impairment, HLD, HTN, GERD, bipolar affective disorder, asthma, anemia, breast lumpectomy, L bunionectomy & neurectomy    PT Frequency 2x / week    PT Duration 6 weeks    PT Treatment/Interventions ADLs/Self Care Home Management;Canalith Repostioning;Cryotherapy;Electrical Stimulation;Iontophoresis 4mg /ml Dexamethasone;Moist Heat;Balance training;Therapeutic exercise;Therapeutic activities;Functional mobility training;Stair training;Gait training;DME Instruction;Ultrasound;Neuromuscular re-education;Cognitive remediation;Patient/family education;Manual techniques;Vestibular;Vasopneumatic Device;Taping;Energy conservation;Dry needling;Passive range of motion    PT Next Visit Plan reassess L DH/sidelying test; balance training; progress postural strengthening    Consulted and Agree with Plan of Care Patient;Family member/caregiver    Family Member Consulted husband           Patient will benefit from skilled therapeutic intervention in order to improve the following deficits and impairments:  Abnormal  gait,Decreased activity tolerance,Decreased strength,Increased fascial restricitons,Impaired UE functional use,Pain,Decreased balance,Difficulty walking,Increased muscle spasms,Improper body mechanics,Dizziness,Decreased range of  motion,Decreased safety awareness,Postural dysfunction,Impaired flexibility  Visit Diagnosis: Acute pain of right shoulder  Unsteadiness on feet  Dizziness and giddiness  Repeated falls  Other abnormalities of gait and mobility  Muscle weakness (generalized)     Problem List Patient Active Problem List   Diagnosis Date Noted  . Recurrent falls 01/17/2021  . Right shoulder pain 01/17/2021  . Vitamin D deficiency 01/17/2021  . 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  . Dementia without behavioral disturbance ([place]) 01/12/2019  . Lumbar radiculopathy 01/06/2019  . Left hip pain 12/22/2018  . Memory dysfunction 01/30/2017  . Urinary frequency 01/19/2016  . Loss of weight 04/19/2015  . Dyspnea 04/06/2015  . Edema 12/28/2014  . Onychomycosis 08/31/2014  . Pain in lower limb 08/31/2014  . 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  . Carpal tunnel syndrome, right 08/25/2014  . Bilateral bunions 08/25/2014  . Left foot pain 08/25/2013  . Fatigue 04/08/2013  . Right carpal tunnel syndrome 07/22/2012  . Brachial neuritis or radiculitis 07/01/2012  . Trigger finger, left 07/01/2012  . Osteopenia 06/01/2012  . Cervical spondylosis 06/01/2012  . Mild intermittent asthma 04/27/2011  . Degenerative joint disease 04/27/2011  . Bipolar affective disorder ([place]) 04/27/2011  . Chronic bronchitis 04/27/2011  . GERD (gastroesophageal reflux disease) 04/27/2011  . Allergic rhinitis, cause unspecified 04/27/2011  . HTN (hypertension) 04/27/2011  . Chronic cystitis 04/27/2011  . Colon polyps 04/27/2011  . Anemia 04/27/2011  . H/O: hysterectomy 04/27/2011  . TIA (transient ischemic attack) 04/27/2011  . Urinary incontinence 04/27/2011  . 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 Hyperlipidemia 04/27/2011  . Preventative health care 04/27/2011     Janene Harvey, PT, DPT 02/07/21 5:54 PM   [facility] [address]  [place], [place], [address] Phone: [phone]   Fax:  [phone]  Name: LAMEKA DISLA MRN: [id number] Date of Birth: 1941/02/04

## 2021-02-10 ENCOUNTER — Ambulatory Visit: Payer: Medicare Other | Admitting: Physical Therapy

## 2021-02-10 ENCOUNTER — Other Ambulatory Visit: Payer: Self-pay

## 2021-02-10 ENCOUNTER — Encounter: Payer: Self-pay | Admitting: Physical Therapy

## 2021-02-10 DIAGNOSIS — R2681 Unsteadiness on feet: Secondary | ICD-10-CM

## 2021-02-10 DIAGNOSIS — R42 Dizziness and giddiness: Secondary | ICD-10-CM | POA: Diagnosis not present

## 2021-02-10 DIAGNOSIS — M6281 Muscle weakness (generalized): Secondary | ICD-10-CM

## 2021-02-10 DIAGNOSIS — R296 Repeated falls: Secondary | ICD-10-CM | POA: Diagnosis not present

## 2021-02-10 DIAGNOSIS — R2689 Other abnormalities of gait and mobility: Secondary | ICD-10-CM | POA: Diagnosis not present

## 2021-02-10 DIAGNOSIS — M25511 Pain in right shoulder: Secondary | ICD-10-CM | POA: Diagnosis not present

## 2021-02-10 NOTE — Therapy (Signed)
[facility] [address]  [place], [place], [address] Phone: [phone]   Fax:  [phone]  Physical Therapy Treatment  Patient Details  Name: Madeline Mcdaniel MRN: [id number] Date of Birth: Jan 27, 1941 Referring Provider (PT): Lynne Leader, MD & Cathlean Cower, MD   Encounter Date: 02/10/2021   PT End of Session - 02/10/21 1014    Visit Number 4    Number of Visits 13    Date for PT Re-Evaluation 03/14/21    Authorization Type Medicare & Dory Horn    PT Start Time 0932    PT Stop Time 1012    PT Time Calculation (min) 40 min    Equipment Utilized During Treatment Gait belt    Activity Tolerance Patient tolerated treatment well    Behavior During Therapy [facility] for tasks assessed/performed           Past Medical History:  Diagnosis Date  . Allergic rhinitis, cause unspecified 04/27/2011  . Anemia 04/27/2011  . Asthma 04/27/2011  . Bipolar affective disorder ([place]) 04/27/2011  . Cervical spondylosis 06/01/2012  . Chronic bronchitis 04/27/2011  . Chronic cystitis 04/27/2011  . Colon polyps 04/27/2011  . Degenerative joint disease 04/27/2011  . GERD (gastroesophageal reflux disease) 04/27/2011  . HTN (hypertension) 04/27/2011  . Hyperlipidemia 04/27/2011  . Mild cognitive impairment 04/12/2013  . Osteopenia 06/01/2012  . 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 Right carpal tunnel syndrome 07/22/2012  . TIA (transient ischemic attack) 04/27/2011  . Urinary incontinence 04/27/2011    Past Surgical History:  Procedure Laterality Date  . BLADDER SUSPENSION     A&P REPAIR WITH MESH  . BREAST BIOPSY    . BREAST LUMPECTOMY     SEVERAL TIMES  . BUNIONECTOMY     LEFT FOOT  . CHOLECYSTECTOMY    . COLONOSCOPY  46YRS AGO   IN [place],PT DOES NOT REMEMBER DOCTOR  . Lauro Regulus Bunionectomy Left 09/09/2014   @ [facility]  . NEURECTOMY FOOT Left 09/09/2014   @ [facility]  . ORTHOSCOPIC CARPETOMY     BOTH THUMBS  . POLYPECTOMY  10 YRS AGO    BENIGN POLYPS X2  . STERIOTACTIC BREAST BIOPSY     RIGHT  . TENDON TRANSFER  12/09/2012   Procedure: TENDON TRANSFER;  Surgeon: Wynonia Sours, MD;  Location: [facility];  Service: Orthopedics;  Laterality: Left;  Tenodesis PIP with FDS Slip Left ring finger, Juggerknot  . TONSILLECTOMY    . TOTAL ABDOMINAL HYSTERECTOMY    . TRIGGER FINGER RELEASE     LEFT RING FINGER    There were no vitals filed for this visit.   Subjective Assessment - 02/10/21 0934    Subjective Reports that she has not been dizzy when getting out of bed.    Patient is accompained by: Family member   husband   Pertinent History TIA 2012, R CTS, osteopenia, mild cognitive impairment, HLD, HTN, GERD, bipolar affective disorder, asthma, anemia, breast lumpectomy, L bunionectomy & neurectomy    Diagnostic tests 01/23/21 R shoulder xray: Negative.    Patient Stated Goals decrease pain and improve balance    Currently in Pain? No/denies                   Vestibular Assessment - 02/10/21 0001      Dix-Hallpike Left   Dix-Hallpike Left Duration 0    Dix-Hallpike Left Symptoms No nystagmus  [facility] Adult PT Treatment/Exercise - 02/10/21 0001      Shoulder Exercises: Seated   Row Strengthening;Both;10 reps;Theraband    Theraband Level (Shoulder Row) Level 2 (Red)    Row Limitations cues for scap retraction and depression    Other Seated Exercises scapular retraction 10x3"      Shoulder Exercises: Sidelying   External Rotation Strengthening;Right;10 reps;Weights    External Rotation Weight (lbs) 1    External Rotation Limitations manually maintaining shoulder in neutral throughout d/t heavy compensations    ABduction Strengthening;Right;5 reps    ABduction Limitations thumb up; good ROM   manual cueing to avoid rolling onto back     Shoulder Exercises: Standing   Extension Strengthening;Both;10 reps;Theraband    Theraband Level (Shoulder Extension) Level 1 (Yellow)     Extension Limitations 2x10; cues for wide BOS for balance and manual/verbal cues to maintain proper form      Shoulder Exercises: ROM/Strengthening   Nustep L4 x 6 min (UEs/LEs)                  PT Education - 02/10/21 1013    Education Details discussion with patient's husband about patient's improvement in dizziness and importance of HEP compliance    Person(s) Educated Patient    Methods Explanation;Demonstration;Tactile cues;Verbal cues    Comprehension Verbalized understanding;Returned demonstration            PT Short Term Goals - 02/10/21 1016      PT SHORT TERM GOAL #1   Title Patient to be independent with initial HEP.    Time 3    Period Weeks    Status On-going    Target Date 02/21/21             PT Long Term Goals - 02/07/21 1628      PT LONG TERM GOAL #1   Title Patient to be independent with advanced HEP.    Time 6    Period Weeks    Status On-going      PT LONG TERM GOAL #2   Title Patient to demonstrate R shoulder AROM WFL and without pain limiting.    Time 6    Period Weeks    Status On-going      PT LONG TERM GOAL #3   Title Patient to demonstrate R shoulder strength >/=4+/5.    Time 6    Period Weeks    Status On-going      PT LONG TERM GOAL #4   Title Patient to score atleast 47/56 on berg in order to decrease risk of falls.    Time 6    Period Weeks    Status On-going      PT LONG TERM GOAL #5   Title Patient to report no dizziness with transfers or bed mobility.    Time 6    Period Weeks    Status On-going                 Plan - 02/10/21 1014    Clinical Impression Statement Patient arrived to session with report that she has not been dizzy when getting out of bed since last session. Re-assessed L DH, which was negative for dizziness ands nystagmus. Worked on scapular retraction and light periscapular strengthening. Patient required consistent cueing for posture and form d/t cognition, however tolerated these  activities without complaints. Worked on sidelying shoulder AROM which patient was able to tolerate today without c/o dizziness. Demonstrated good shoulder abduction ROM in this  position. Patient denied pain at end of session.    Personal Factors and Comorbidities Age;Comorbidity 3+;Fitness;Past/Current Experience;Time since onset of injury/illness/exacerbation;Transportation    Comorbidities TIA 2012, R CTS, osteopenia, mild cognitive impairment, HLD, HTN, GERD, bipolar affective disorder, asthma, anemia, breast lumpectomy, L bunionectomy & neurectomy    PT Frequency 2x / week    PT Duration 6 weeks    PT Treatment/Interventions ADLs/Self Care Home Management;Canalith Repostioning;Cryotherapy;Electrical Stimulation;Iontophoresis 4mg /ml Dexamethasone;Moist Heat;Balance training;Therapeutic exercise;Therapeutic activities;Functional mobility training;Stair training;Gait training;DME Instruction;Ultrasound;Neuromuscular re-education;Cognitive remediation;Patient/family education;Manual techniques;Vestibular;Vasopneumatic Device;Taping;Energy conservation;Dry needling;Passive range of motion    PT Next Visit Plan balance training; progress postural and RTC strengthening    Consulted and Agree with Plan of Care Patient;Family member/caregiver    Family Member Consulted husband           Patient will benefit from skilled therapeutic intervention in order to improve the following deficits and impairments:  Abnormal gait,Decreased activity tolerance,Decreased strength,Increased fascial restricitons,Impaired UE functional use,Pain,Decreased balance,Difficulty walking,Increased muscle spasms,Improper body mechanics,Dizziness,Decreased range of motion,Decreased safety awareness,Postural dysfunction,Impaired flexibility  Visit Diagnosis: Acute pain of right shoulder  Unsteadiness on feet  Dizziness and giddiness  Repeated falls  Other abnormalities of gait and mobility  Muscle weakness  (generalized)     Problem List Patient Active Problem List   Diagnosis Date Noted  . Recurrent falls 01/17/2021  . Right shoulder pain 01/17/2021  . Vitamin D deficiency 01/17/2021  . 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  . Dementia without behavioral disturbance ([place]) 01/12/2019  . Lumbar radiculopathy 01/06/2019  . Left hip pain 12/22/2018  . Memory dysfunction 01/30/2017  . Urinary frequency 01/19/2016  . Loss of weight 04/19/2015  . Dyspnea 04/06/2015  . Edema 12/28/2014  . Onychomycosis 08/31/2014  . Pain in lower limb 08/31/2014  . 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  . Carpal tunnel syndrome, right 08/25/2014  . Bilateral bunions 08/25/2014  . Left foot pain 08/25/2013  . Fatigue 04/08/2013  . Right carpal tunnel syndrome 07/22/2012  . Brachial neuritis or radiculitis 07/01/2012  . Trigger finger, left 07/01/2012  . Osteopenia 06/01/2012  . Cervical spondylosis 06/01/2012  . Mild intermittent asthma 04/27/2011  . Degenerative joint disease 04/27/2011  . Bipolar affective disorder ([place]) 04/27/2011  . Chronic bronchitis 04/27/2011  . GERD (gastroesophageal reflux disease) 04/27/2011  . Allergic rhinitis, cause unspecified 04/27/2011  . HTN (hypertension) 04/27/2011  . Chronic cystitis 04/27/2011  . Colon polyps 04/27/2011  . Anemia 04/27/2011  . H/O: hysterectomy 04/27/2011  . TIA (transient ischemic attack) 04/27/2011  . Urinary incontinence 04/27/2011  . 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 Hyperlipidemia 04/27/2011  . Preventative health care 04/27/2011     Janene Harvey, PT, DPT 02/10/21 10:18 AM   [facility] [address]  [place], [place], [address] Phone: [phone]   Fax:  [phone]  Name: ZAINEB NOWACZYK MRN: [id number] Date of Birth: Nov 15, 1941

## 2021-02-14 ENCOUNTER — Other Ambulatory Visit: Payer: Self-pay

## 2021-02-14 ENCOUNTER — Encounter: Payer: Self-pay | Admitting: Physical Therapy

## 2021-02-14 ENCOUNTER — Ambulatory Visit: Payer: Medicare Other | Admitting: Physical Therapy

## 2021-02-14 DIAGNOSIS — R2681 Unsteadiness on feet: Secondary | ICD-10-CM | POA: Diagnosis not present

## 2021-02-14 DIAGNOSIS — M6281 Muscle weakness (generalized): Secondary | ICD-10-CM

## 2021-02-14 DIAGNOSIS — R42 Dizziness and giddiness: Secondary | ICD-10-CM

## 2021-02-14 DIAGNOSIS — R2689 Other abnormalities of gait and mobility: Secondary | ICD-10-CM

## 2021-02-14 DIAGNOSIS — R296 Repeated falls: Secondary | ICD-10-CM | POA: Diagnosis not present

## 2021-02-14 DIAGNOSIS — M25511 Pain in right shoulder: Secondary | ICD-10-CM

## 2021-02-14 NOTE — Therapy (Signed)
[facility] [address]  [place], [place], [address] Phone: [phone]   Fax:  [phone]  Physical Therapy Treatment  Patient Details  Name: Madeline Mcdaniel MRN: [id number] Date of Birth: 1941-10-25 Referring Provider (PT): Lynne Leader, MD & Cathlean Cower, MD   Encounter Date: 02/14/2021   PT End of Session - 02/14/21 1440    Visit Number 5    Number of Visits 13    Date for PT Re-Evaluation 03/14/21    Authorization Type Medicare & Dory Horn    PT Start Time 1410   pt late   PT Stop Time 1444    PT Time Calculation (min) 34 min    Equipment Utilized During Treatment Gait belt    Activity Tolerance Patient tolerated treatment well    Behavior During Therapy [facility] for tasks assessed/performed           Past Medical History:  Diagnosis Date  . Allergic rhinitis, cause unspecified 04/27/2011  . Anemia 04/27/2011  . Asthma 04/27/2011  . Bipolar affective disorder ([place]) 04/27/2011  . Cervical spondylosis 06/01/2012  . Chronic bronchitis 04/27/2011  . Chronic cystitis 04/27/2011  . Colon polyps 04/27/2011  . Degenerative joint disease 04/27/2011  . GERD (gastroesophageal reflux disease) 04/27/2011  . HTN (hypertension) 04/27/2011  . Hyperlipidemia 04/27/2011  . Mild cognitive impairment 04/12/2013  . Osteopenia 06/01/2012  . 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 Right carpal tunnel syndrome 07/22/2012  . TIA (transient ischemic attack) 04/27/2011  . Urinary incontinence 04/27/2011    Past Surgical History:  Procedure Laterality Date  . BLADDER SUSPENSION     A&P REPAIR WITH MESH  . BREAST BIOPSY    . BREAST LUMPECTOMY     SEVERAL TIMES  . BUNIONECTOMY     LEFT FOOT  . CHOLECYSTECTOMY    . COLONOSCOPY  19YRS AGO   IN [place],PT DOES NOT REMEMBER DOCTOR  . Lauro Regulus Bunionectomy Left 09/09/2014   @ [facility]  . NEURECTOMY FOOT Left 09/09/2014   @ [place]  . ORTHOSCOPIC CARPETOMY     BOTH THUMBS  . POLYPECTOMY  10 YRS AGO    BENIGN POLYPS X2  . STERIOTACTIC BREAST BIOPSY     RIGHT  . TENDON TRANSFER  12/09/2012   Procedure: TENDON TRANSFER;  Surgeon: Wynonia Sours, MD;  Location: [facility];  Service: Orthopedics;  Laterality: Left;  Tenodesis PIP with FDS Slip Left ring finger, Juggerknot  . TONSILLECTOMY    . TOTAL ABDOMINAL HYSTERECTOMY    . TRIGGER FINGER RELEASE     LEFT RING FINGER    There were no vitals filed for this visit.   Subjective Assessment - 02/14/21 1412    Subjective Has been busy and running around today.    Patient is accompained by: Family member    Pertinent History TIA 2012, R CTS, osteopenia, mild cognitive impairment, HLD, HTN, GERD, bipolar affective disorder, asthma, anemia, breast lumpectomy, L bunionectomy & neurectomy    Limitations Sitting    Diagnostic tests 01/23/21 R shoulder xray: Negative.    Patient Stated Goals decrease pain and improve balance    Currently in Pain? No/denies                             [facility] Adult PT Treatment/Exercise - 02/14/21 0001      Ambulation/Gait   Ambulation Distance (Feet) 180 Feet    Assistive device Straight  cane    Gait Pattern Step-to pattern;Decreased step length - right;Decreased step length - left    Ambulation Surface Level;Indoor    Gait velocity decreased    Gait Comments manual & verbal cues required to maintain SPC sequencing and increase step length      Shoulder Exercises: Supine   Horizontal ABduction Strengthening;Both;10 reps;Theraband    Theraband Level (Shoulder Horizontal ABduction) Level 1 (Yellow)    Horizontal ABduction Limitations 2x10; cues to maintain shoulders at 90 deg    External Rotation Strengthening;Both;10 reps;Theraband    Theraband Level (Shoulder External Rotation) Level 1 (Yellow)    External Rotation Limitations 2x10; towel rolls under arms; limited control, limited ROM R>L      Shoulder Exercises: Sidelying   External Rotation Strengthening;Right;10  reps;Weights    External Rotation Weight (lbs) 1    External Rotation Limitations 2x10; manual assistance to maintain proper form    ABduction Strengthening;Right;10 reps    ABduction Weight (lbs) 1    ABduction Limitations thumb up; cues to avoid rolling onto back      Shoulder Exercises: ROM/Strengthening   UBE (Upper Arm Bike) L1.0 x 46min forward/2 min back                    PT Short Term Goals - 02/10/21 1016      PT SHORT TERM GOAL #1   Title Patient to be independent with initial HEP.    Time 3    Period Weeks    Status On-going    Target Date 02/21/21             PT Long Term Goals - 02/07/21 1628      PT LONG TERM GOAL #1   Title Patient to be independent with advanced HEP.    Time 6    Period Weeks    Status On-going      PT LONG TERM GOAL #2   Title Patient to demonstrate R shoulder AROM WFL and without pain limiting.    Time 6    Period Weeks    Status On-going      PT LONG TERM GOAL #3   Title Patient to demonstrate R shoulder strength >/=4+/5.    Time 6    Period Weeks    Status On-going      PT LONG TERM GOAL #4   Title Patient to score atleast 47/56 on berg in order to decrease risk of falls.    Time 6    Period Weeks    Status On-going      PT LONG TERM GOAL #5   Title Patient to report no dizziness with transfers or bed mobility.    Time 6    Period Weeks    Status On-going                 Plan - 02/14/21 1503    Clinical Impression Statement Patient without new complaints today. Arrived to session late without SPC in hand. Reviewed gait training with SPC with consistent cueing to encourage proper SPC sequencing and increased step length. Increased weighted resistance with shoulder ther-ex, with patient demonstrating good ROM but requiring consistent verbal and manual guidance to maintain proper form d/t limited carryover. Periscapular strengthening was progressed in supine, with limited ROM demonstrated on R >L d/t  weakness. Patient reported no  pain at end of session, thus modalities deferred.    Personal Factors and Comorbidities Age;Comorbidity 3+;Fitness;Past/Current Experience;Time since onset of injury/illness/exacerbation;Transportation  Comorbidities TIA 2012, R CTS, osteopenia, mild cognitive impairment, HLD, HTN, GERD, bipolar affective disorder, asthma, anemia, breast lumpectomy, L bunionectomy & neurectomy    PT Frequency 2x / week    PT Duration 6 weeks    PT Treatment/Interventions ADLs/Self Care Home Management;Canalith Repostioning;Cryotherapy;Electrical Stimulation;Iontophoresis 4mg /ml Dexamethasone;Moist Heat;Balance training;Therapeutic exercise;Therapeutic activities;Functional mobility training;Stair training;Gait training;DME Instruction;Ultrasound;Neuromuscular re-education;Cognitive remediation;Patient/family education;Manual techniques;Vestibular;Vasopneumatic Device;Taping;Energy conservation;Dry needling;Passive range of motion    PT Next Visit Plan balance training; progress postural and RTC strengthening    Consulted and Agree with Plan of Care Patient;Family member/caregiver    Family Member Consulted husband           Patient will benefit from skilled therapeutic intervention in order to improve the following deficits and impairments:  Abnormal gait,Decreased activity tolerance,Decreased strength,Increased fascial restricitons,Impaired UE functional use,Pain,Decreased balance,Difficulty walking,Increased muscle spasms,Improper body mechanics,Dizziness,Decreased range of motion,Decreased safety awareness,Postural dysfunction,Impaired flexibility  Visit Diagnosis: Acute pain of right shoulder  Unsteadiness on feet  Dizziness and giddiness  Repeated falls  Other abnormalities of gait and mobility  Muscle weakness (generalized)     Problem List Patient Active Problem List   Diagnosis Date Noted  . Recurrent falls 01/17/2021  . Right shoulder pain 01/17/2021  .  Vitamin D deficiency 01/17/2021  . 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  . Dementia without behavioral disturbance ([place]) 01/12/2019  . Lumbar radiculopathy 01/06/2019  . Left hip pain 12/22/2018  . Memory dysfunction 01/30/2017  . Urinary frequency 01/19/2016  . Loss of weight 04/19/2015  . Dyspnea 04/06/2015  . Edema 12/28/2014  . Onychomycosis 08/31/2014  . Pain in lower limb 08/31/2014  . 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  . Carpal tunnel syndrome, right 08/25/2014  . Bilateral bunions 08/25/2014  . Left foot pain 08/25/2013  . Fatigue 04/08/2013  . Right carpal tunnel syndrome 07/22/2012  . Brachial neuritis or radiculitis 07/01/2012  . Trigger finger, left 07/01/2012  . Osteopenia 06/01/2012  . Cervical spondylosis 06/01/2012  . Mild intermittent asthma 04/27/2011  . Degenerative joint disease 04/27/2011  . Bipolar affective disorder ([place]) 04/27/2011  . Chronic bronchitis 04/27/2011  . GERD (gastroesophageal reflux disease) 04/27/2011  . Allergic rhinitis, cause unspecified 04/27/2011  . HTN (hypertension) 04/27/2011  . Chronic cystitis 04/27/2011  . Colon polyps 04/27/2011  . Anemia 04/27/2011  . H/O: hysterectomy 04/27/2011  . TIA (transient ischemic attack) 04/27/2011  . Urinary incontinence 04/27/2011  . 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 Hyperlipidemia 04/27/2011  . Preventative health care 04/27/2011     Janene Harvey, PT, DPT 02/14/21 3:06 PM   [facility] [address]  [place], [place], [address] Phone: [phone]   Fax:  [phone]  Name: INELL MIMBS MRN: [id number] Date of Birth: [birth year]

## 2021-02-17 ENCOUNTER — Ambulatory Visit: Payer: Medicare Other | Admitting: Physical Therapy

## 2021-02-21 ENCOUNTER — Ambulatory Visit: Payer: Medicare Other | Admitting: Physical Therapy

## 2021-02-21 ENCOUNTER — Encounter: Payer: Self-pay | Admitting: Physical Therapy

## 2021-02-21 ENCOUNTER — Other Ambulatory Visit: Payer: Self-pay

## 2021-02-21 DIAGNOSIS — R2689 Other abnormalities of gait and mobility: Secondary | ICD-10-CM

## 2021-02-21 DIAGNOSIS — R2681 Unsteadiness on feet: Secondary | ICD-10-CM

## 2021-02-21 DIAGNOSIS — R296 Repeated falls: Secondary | ICD-10-CM

## 2021-02-21 DIAGNOSIS — M25511 Pain in right shoulder: Secondary | ICD-10-CM

## 2021-02-21 DIAGNOSIS — M6281 Muscle weakness (generalized): Secondary | ICD-10-CM

## 2021-02-21 DIAGNOSIS — R42 Dizziness and giddiness: Secondary | ICD-10-CM | POA: Diagnosis not present

## 2021-02-21 NOTE — Therapy (Signed)
[facility] [address]  [place], [place], [address] Phone: [phone]   Fax:  [phone]  Physical Therapy Treatment  Patient Details  Name: Madeline Mcdaniel MRN: [id number] Date of Birth: May 02, 1941 Referring Provider (PT): Lynne Leader, MD & Cathlean Cower, MD    Encounter Date: 02/21/2021   PT End of Session - 02/21/21 1442    Visit Number 6    Number of Visits 13    Date for PT Re-Evaluation 03/14/21    Authorization Type Medicare & Dory Horn    PT Start Time 6962    PT Stop Time 1444    PT Time Calculation (min) 45 min    Equipment Utilized During Treatment Gait belt    Activity Tolerance Patient tolerated treatment well    Behavior During Therapy [facility] for tasks assessed/performed           Past Medical History:  Diagnosis Date  . Allergic rhinitis, cause unspecified 04/27/2011  . Anemia 04/27/2011  . Asthma 04/27/2011  . Bipolar affective disorder ([place]) 04/27/2011  . Cervical spondylosis 06/01/2012  . Chronic bronchitis 04/27/2011  . Chronic cystitis 04/27/2011  . Colon polyps 04/27/2011  . Degenerative joint disease 04/27/2011  . GERD (gastroesophageal reflux disease) 04/27/2011  . HTN (hypertension) 04/27/2011  . Hyperlipidemia 04/27/2011  . Mild cognitive impairment 04/12/2013  . Osteopenia 06/01/2012  . 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 Right carpal tunnel syndrome 07/22/2012  . TIA (transient ischemic attack) 04/27/2011  . Urinary incontinence 04/27/2011    Past Surgical History:  Procedure Laterality Date  . BLADDER SUSPENSION     A&P REPAIR WITH MESH  . BREAST BIOPSY    . BREAST LUMPECTOMY     SEVERAL TIMES  . BUNIONECTOMY     LEFT FOOT  . CHOLECYSTECTOMY    . COLONOSCOPY  43YRS AGO   IN [place],PT DOES NOT REMEMBER DOCTOR  . Lauro Regulus Bunionectomy Left 09/09/2014   @ [facility]  . NEURECTOMY FOOT Left 09/09/2014   @ [place]  . ORTHOSCOPIC CARPETOMY     BOTH THUMBS  . POLYPECTOMY  10 YRS AGO    BENIGN POLYPS X2  . STERIOTACTIC BREAST BIOPSY     RIGHT  . TENDON TRANSFER  12/09/2012   Procedure: TENDON TRANSFER;  Surgeon: Wynonia Sours, MD;  Location: [facility];  Service: Orthopedics;  Laterality: Left;  Tenodesis PIP with FDS Slip Left ring finger, Juggerknot  . TONSILLECTOMY    . TOTAL ABDOMINAL HYSTERECTOMY    . TRIGGER FINGER RELEASE     LEFT RING FINGER    There were no vitals filed for this visit.   Subjective Assessment - 02/21/21 1403    Subjective Had some company over at her house. Using her [facility] consistently.    Pertinent History TIA 2012, R CTS, osteopenia, mild cognitive impairment, HLD, HTN, GERD, bipolar affective disorder, asthma, anemia, breast lumpectomy, L bunionectomy & neurectomy    Diagnostic tests 01/23/21 R shoulder xray: Negative.    Patient Stated Goals decrease pain and improve balance    Currently in Pain? No/denies                             [facility] Adult PT Treatment/Exercise - 02/21/21 0001      Shoulder Exercises: Seated   External Rotation Strengthening;Both;10 reps;Theraband    Theraband Level (Shoulder External Rotation) Level 1 (Yellow)    External Rotation Limitations  B ER with consistent cueing to maintain B shoulders in neutral; 2x10    Flexion Strengthening;Right;10 reps;Weights    Flexion Weight (lbs) 1    Flexion Limitations good ROM    Abduction Strengthening;Right;10 reps    ABduction Weight (lbs) 1    ABduction Limitations thumb up   consistent manual and VC's for proper alignment/form   Other Seated Exercises R scaption with 1# 10x      Shoulder Exercises: ROM/Strengthening   Nustep L4 x 6 min (UEs/LEs)               Balance Exercises - 02/21/21 0001      Balance Exercises: Standing   Standing Eyes Opened Foam/compliant surface;Narrow base of support (BOS);2 reps;30 secs   1st rep EO, 2nd rep EC; mod/severe sway with EC   Tandem Stance 2 reps;Eyes open;30 secs   no UE support   Tandem  Gait Forward;2 reps   along counter; CGA but good stability   Other Standing Exercises alt march on foam 2x10   CGA; cueing for foot pleacement   Other Standing Exercises Comments R/L toe tap on step 10x, 3 toe tap on stem 10x   CGA for safety              PT Short Term Goals - 02/10/21 1016      PT SHORT TERM GOAL #1   Title Patient to be independent with initial HEP.    Time 3    Period Weeks    Status On-going    Target Date 02/21/21             PT Long Term Goals - 02/07/21 1628      PT LONG TERM GOAL #1   Title Patient to be independent with advanced HEP.    Time 6    Period Weeks    Status On-going      PT LONG TERM GOAL #2   Title Patient to demonstrate R shoulder AROM WFL and without pain limiting.    Time 6    Period Weeks    Status On-going      PT LONG TERM GOAL #3   Title Patient to demonstrate R shoulder strength >/=4+/5.    Time 6    Period Weeks    Status On-going      PT LONG TERM GOAL #4   Title Patient to score atleast 47/56 on berg in order to decrease risk of falls.    Time 6    Period Weeks    Status On-going      PT LONG TERM GOAL #5   Title Patient to report no dizziness with transfers or bed mobility.    Time 6    Period Weeks    Status On-going                 Plan - 02/21/21 1444    Clinical Impression Statement Patient arrived to session without new complaints. Noting improved R shoulder pain Worked on narrow BOS and single leg stance balance challenges with patient demonstrating quite good stability, however still requiring intermittent UE support.  Patient seemed to demonstrate best carryover with demonstration and repetition today. Initiated balance work on compliant surface with patient demonstrating most instability with EC and dynamic tasks. Increased weighted resistance with R shoulder overhead elevation activities with patient demonstrating good ROM and tolerance. Patient required consistent VC's and TC's for proper  alignment and form. Overall patient tolerated session well and without complaints  at end of session.    Personal Factors and Comorbidities Age;Comorbidity 3+;Fitness;Past/Current Experience;Time since onset of injury/illness/exacerbation;Transportation    Comorbidities TIA 2012, R CTS, osteopenia, mild cognitive impairment, HLD, HTN, GERD, bipolar affective disorder, asthma, anemia, breast lumpectomy, L bunionectomy & neurectomy    PT Frequency 2x / week    PT Duration 6 weeks    PT Treatment/Interventions ADLs/Self Care Home Management;Canalith Repostioning;Cryotherapy;Electrical Stimulation;Iontophoresis 4mg /ml Dexamethasone;Moist Heat;Balance training;Therapeutic exercise;Therapeutic activities;Functional mobility training;Stair training;Gait training;DME Instruction;Ultrasound;Neuromuscular re-education;Cognitive remediation;Patient/family education;Manual techniques;Vestibular;Vasopneumatic Device;Taping;Energy conservation;Dry needling;Passive range of motion    PT Next Visit Plan balance training; progress postural and RTC strengthening    Consulted and Agree with Plan of Care Patient;Family member/caregiver    Family Member Consulted husband           Patient will benefit from skilled therapeutic intervention in order to improve the following deficits and impairments:  Abnormal gait,Decreased activity tolerance,Decreased strength,Increased fascial restricitons,Impaired UE functional use,Pain,Decreased balance,Difficulty walking,Increased muscle spasms,Improper body mechanics,Dizziness,Decreased range of motion,Decreased safety awareness,Postural dysfunction,Impaired flexibility  Visit Diagnosis: Acute pain of right shoulder  Unsteadiness on feet  Dizziness and giddiness  Repeated falls  Other abnormalities of gait and mobility  Muscle weakness (generalized)     Problem List Patient Active Problem List   Diagnosis Date Noted  . Recurrent falls 01/17/2021  . Right shoulder  pain 01/17/2021  . Vitamin D deficiency 01/17/2021  . 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  . Dementia without behavioral disturbance ([place]) 01/12/2019  . Lumbar radiculopathy 01/06/2019  . Left hip pain 12/22/2018  . Memory dysfunction 01/30/2017  . Urinary frequency 01/19/2016  . Loss of weight 04/19/2015  . Dyspnea 04/06/2015  . Edema 12/28/2014  . Onychomycosis 08/31/2014  . Pain in lower limb 08/31/2014  . 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  . Carpal tunnel syndrome, right 08/25/2014  . Bilateral bunions 08/25/2014  . Left foot pain 08/25/2013  . Fatigue 04/08/2013  . Right carpal tunnel syndrome 07/22/2012  . Brachial neuritis or radiculitis 07/01/2012  . Trigger finger, left 07/01/2012  . Osteopenia 06/01/2012  . Cervical spondylosis 06/01/2012  . Mild intermittent asthma 04/27/2011  . Degenerative joint disease 04/27/2011  . Bipolar affective disorder ([place]) 04/27/2011  . Chronic bronchitis 04/27/2011  . GERD (gastroesophageal reflux disease) 04/27/2011  . Allergic rhinitis, cause unspecified 04/27/2011  . HTN (hypertension) 04/27/2011  . Chronic cystitis 04/27/2011  . Colon polyps 04/27/2011  . Anemia 04/27/2011  . H/O: hysterectomy 04/27/2011  . TIA (transient ischemic attack) 04/27/2011  . Urinary incontinence 04/27/2011  . 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 Hyperlipidemia 04/27/2011  . Preventative health care 04/27/2011     Janene Harvey, PT, DPT 02/21/21 2:45 PM   [facility] [address]  [place], [place], [address] Phone: [phone]   Fax:  [phone]  Name: Madeline Mcdaniel MRN: [id number] Date of Birth: 09-08-1941

## 2021-02-24 ENCOUNTER — Encounter: Payer: Self-pay | Admitting: Physical Therapy

## 2021-02-24 ENCOUNTER — Other Ambulatory Visit: Payer: Self-pay

## 2021-02-24 ENCOUNTER — Ambulatory Visit: Payer: Medicare Other | Admitting: Physical Therapy

## 2021-02-24 DIAGNOSIS — M6281 Muscle weakness (generalized): Secondary | ICD-10-CM

## 2021-02-24 DIAGNOSIS — R2681 Unsteadiness on feet: Secondary | ICD-10-CM

## 2021-02-24 DIAGNOSIS — R2689 Other abnormalities of gait and mobility: Secondary | ICD-10-CM

## 2021-02-24 DIAGNOSIS — R296 Repeated falls: Secondary | ICD-10-CM

## 2021-02-24 DIAGNOSIS — R42 Dizziness and giddiness: Secondary | ICD-10-CM | POA: Diagnosis not present

## 2021-02-24 DIAGNOSIS — M25511 Pain in right shoulder: Secondary | ICD-10-CM

## 2021-02-24 NOTE — Therapy (Signed)
[facility] [address]  [place], [place], [address] Phone: [phone]   Fax:  [phone]  Physical Therapy Treatment  Patient Details  Name: Madeline Mcdaniel MRN: [id number] Date of Birth: Apr 21, 1941 Referring Provider (PT): Lynne Leader, MD & Cathlean Cower, MD   Encounter Date: 02/24/2021   PT End of Session - 02/24/21 1148    Visit Number 7    Number of Visits 13    Date for PT Re-Evaluation 03/14/21    Authorization Type Medicare & Dory Horn    PT Start Time 1013    PT Stop Time 1057    PT Time Calculation (min) 44 min    Equipment Utilized During Treatment Gait belt    Activity Tolerance Patient tolerated treatment well    Behavior During Therapy [facility] for tasks assessed/performed           Past Medical History:  Diagnosis Date  . Allergic rhinitis, cause unspecified 04/27/2011  . Anemia 04/27/2011  . Asthma 04/27/2011  . Bipolar affective disorder ([place]) 04/27/2011  . Cervical spondylosis 06/01/2012  . Chronic bronchitis 04/27/2011  . Chronic cystitis 04/27/2011  . Colon polyps 04/27/2011  . Degenerative joint disease 04/27/2011  . GERD (gastroesophageal reflux disease) 04/27/2011  . HTN (hypertension) 04/27/2011  . Hyperlipidemia 04/27/2011  . Mild cognitive impairment 04/12/2013  . Osteopenia 06/01/2012  . 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 Right carpal tunnel syndrome 07/22/2012  . TIA (transient ischemic attack) 04/27/2011  . Urinary incontinence 04/27/2011    Past Surgical History:  Procedure Laterality Date  . BLADDER SUSPENSION     A&P REPAIR WITH MESH  . BREAST BIOPSY    . BREAST LUMPECTOMY     SEVERAL TIMES  . BUNIONECTOMY     LEFT FOOT  . CHOLECYSTECTOMY    . COLONOSCOPY  10YRS AGO   IN [place],PT DOES NOT REMEMBER DOCTOR  . Lauro Regulus Bunionectomy Left 09/09/2014   @ [facility]  . NEURECTOMY FOOT Left 09/09/2014   @ [place]  . ORTHOSCOPIC CARPETOMY     BOTH THUMBS  . POLYPECTOMY  10 YRS AGO    BENIGN POLYPS X2  . STERIOTACTIC BREAST BIOPSY     RIGHT  . TENDON TRANSFER  12/09/2012   Procedure: TENDON TRANSFER;  Surgeon: Wynonia Sours, MD;  Location: [facility];  Service: Orthopedics;  Laterality: Left;  Tenodesis PIP with FDS Slip Left ring finger, Juggerknot  . TONSILLECTOMY    . TOTAL ABDOMINAL HYSTERECTOMY    . TRIGGER FINGER RELEASE     LEFT RING FINGER    There were no vitals filed for this visit.   Subjective Assessment - 02/24/21 1017    Subjective The shoulder hurts sometimes, but is not hurting right now.    Pertinent History TIA 2012, R CTS, osteopenia, mild cognitive impairment, HLD, HTN, GERD, bipolar affective disorder, asthma, anemia, breast lumpectomy, L bunionectomy & neurectomy    Diagnostic tests 01/23/21 R shoulder xray: Negative.    Patient Stated Goals decrease pain and improve balance    Currently in Pain? No/denies                             [facility] Adult PT Treatment/Exercise - 02/24/21 0001      Ambulation/Gait   Ambulation Distance (Feet) 210 Feet    Assistive device Straight cane    Gait Pattern Step-to pattern;Decreased step length - right;Decreased step length -  left    Ambulation Surface Level;Indoor    Gait velocity decreased    Gait Comments verbal cues required to maintain SPC sequencing ; improved ability to reset proper sequencing pattern with less cueing      Shoulder Exercises: ROM/Strengthening   Nustep L4 x 6 min (UEs/LEs)               Balance Exercises - 02/24/21 0001      Balance Exercises: Standing   Standing Eyes Opened 1 rep;30 secs    Standing Eyes Closed 30 secs;2 reps   1 LOB anteriorly but able correct with use of counter   Tandem Gait 4 reps;Forward   intermittent UE support required, cueing for "heel to toe"; good success with 1 finger support on counter   Marching --   alt R/L toe tap on 4" step without UE support 2x20   Other Standing Exercises standing on foam +  multidirectional perturbations x4 min   intermittent reaching/ankle/hip strategy utliized; 2 episodes of posteiror LOB requiring max A   Other Standing Exercises Comments R/L toe tap on cone 10x   heavy cueing to maintain hand hovering over counter top to utilize reaching strategy if needed            PT Education - 02/24/21 1148    Education Details update to HEP- edu on having husband's supervision and using counter top for support    Person(s) Educated Patient;Spouse    Methods Explanation;Demonstration;Tactile cues;Verbal cues;Handout    Comprehension Verbalized understanding;Returned demonstration            PT Short Term Goals - 02/24/21 1152      PT SHORT TERM GOAL #1   Title Patient to be independent with initial HEP.    Time 3    Period Weeks    Status Achieved    Target Date 02/21/21             PT Long Term Goals - 02/07/21 1628      PT LONG TERM GOAL #1   Title Patient to be independent with advanced HEP.    Time 6    Period Weeks    Status On-going      PT LONG TERM GOAL #2   Title Patient to demonstrate R shoulder AROM WFL and without pain limiting.    Time 6    Period Weeks    Status On-going      PT LONG TERM GOAL #3   Title Patient to demonstrate R shoulder strength >/=4+/5.    Time 6    Period Weeks    Status On-going      PT LONG TERM GOAL #4   Title Patient to score atleast 47/56 on berg in order to decrease risk of falls.    Time 6    Period Weeks    Status On-going      PT LONG TERM GOAL #5   Title Patient to report no dizziness with transfers or bed mobility.    Time 6    Period Weeks    Status On-going                 Plan - 02/24/21 1149    Clinical Impression Statement Patient continues to demonstrate inconsistent use of SPC with ambulation, demonstrating tendency to carry it instead. Noting no new issues today. Reviewed gait training with SPC for hopeful improvement in carryover. Patient did demonstrate improved  ability to reset proper sequencing pattern with less cueing  today. Continued to work on static and dynamic balance work with patient demonstrating slightly more instability with tandem walking. Patient required heavy cueing to maintain hand hovering over counter with balance exercises today in order to utilize reaching strategy if needed. Worked on balance reactions to elicit increased reaching reflex, with patient demonstrating overall good response, however with 2 episodes of posterior LOB requiring max A. Reviewed new balance HEP containing exercises that were safely performed today with patient and husband- both reported understanding. No complaints at end of session.    Personal Factors and Comorbidities Age;Comorbidity 3+;Fitness;Past/Current Experience;Time since onset of injury/illness/exacerbation;Transportation    Comorbidities TIA 2012, R CTS, osteopenia, mild cognitive impairment, HLD, HTN, GERD, bipolar affective disorder, asthma, anemia, breast lumpectomy, L bunionectomy & neurectomy    PT Frequency 2x / week    PT Duration 6 weeks    PT Treatment/Interventions ADLs/Self Care Home Management;Canalith Repostioning;Cryotherapy;Electrical Stimulation;Iontophoresis 4mg /ml Dexamethasone;Moist Heat;Balance training;Therapeutic exercise;Therapeutic activities;Functional mobility training;Stair training;Gait training;DME Instruction;Ultrasound;Neuromuscular re-education;Cognitive remediation;Patient/family education;Manual techniques;Vestibular;Vasopneumatic Device;Taping;Energy conservation;Dry needling;Passive range of motion    PT Next Visit Plan balance training; progress postural and RTC strengthening    Consulted and Agree with Plan of Care Patient;Family member/caregiver    Family Member Consulted husband           Patient will benefit from skilled therapeutic intervention in order to improve the following deficits and impairments:  Abnormal gait,Decreased activity tolerance,Decreased  strength,Increased fascial restricitons,Impaired UE functional use,Pain,Decreased balance,Difficulty walking,Increased muscle spasms,Improper body mechanics,Dizziness,Decreased range of motion,Decreased safety awareness,Postural dysfunction,Impaired flexibility  Visit Diagnosis: Acute pain of right shoulder  Unsteadiness on feet  Dizziness and giddiness  Repeated falls  Other abnormalities of gait and mobility  Muscle weakness (generalized)     Problem List Patient Active Problem List   Diagnosis Date Noted  . Recurrent falls 01/17/2021  . Right shoulder pain 01/17/2021  . Vitamin D deficiency 01/17/2021  . 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  . Dementia without behavioral disturbance ([place]) 01/12/2019  . Lumbar radiculopathy 01/06/2019  . Left hip pain 12/22/2018  . Memory dysfunction 01/30/2017  . Urinary frequency 01/19/2016  . Loss of weight 04/19/2015  . Dyspnea 04/06/2015  . Edema 12/28/2014  . Onychomycosis 08/31/2014  . Pain in lower limb 08/31/2014  . 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  . Carpal tunnel syndrome, right 08/25/2014  . Bilateral bunions 08/25/2014  . Left foot pain 08/25/2013  . Fatigue 04/08/2013  . Right carpal tunnel syndrome 07/22/2012  . Brachial neuritis or radiculitis 07/01/2012  . Trigger finger, left 07/01/2012  . Osteopenia 06/01/2012  . Cervical spondylosis 06/01/2012  . Mild intermittent asthma 04/27/2011  . Degenerative joint disease 04/27/2011  . Bipolar affective disorder ([place]) 04/27/2011  . Chronic bronchitis 04/27/2011  . GERD (gastroesophageal reflux disease) 04/27/2011  . Allergic rhinitis, cause unspecified 04/27/2011  . HTN (hypertension) 04/27/2011  . Chronic cystitis 04/27/2011  . Colon polyps 04/27/2011  . Anemia 04/27/2011  . H/O: hysterectomy 04/27/2011  . TIA (transient ischemic attack) 04/27/2011  . Urinary incontinence 04/27/2011  . 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 Hyperlipidemia  04/27/2011  . Preventative health care 04/27/2011     Janene Harvey, PT, DPT 02/24/21 11:54 AM   [facility] [address]  [place], [place], [address] Phone: [phone]   Fax:  [phone]  Name: Madeline Mcdaniel MRN: [id number] Date of Birth: 04-24-1941

## 2021-02-28 ENCOUNTER — Other Ambulatory Visit: Payer: Self-pay

## 2021-02-28 ENCOUNTER — Ambulatory Visit: Payer: Medicare Other

## 2021-02-28 DIAGNOSIS — R296 Repeated falls: Secondary | ICD-10-CM

## 2021-02-28 DIAGNOSIS — R2681 Unsteadiness on feet: Secondary | ICD-10-CM

## 2021-02-28 DIAGNOSIS — R42 Dizziness and giddiness: Secondary | ICD-10-CM | POA: Diagnosis not present

## 2021-02-28 DIAGNOSIS — M6281 Muscle weakness (generalized): Secondary | ICD-10-CM | POA: Diagnosis not present

## 2021-02-28 DIAGNOSIS — R2689 Other abnormalities of gait and mobility: Secondary | ICD-10-CM | POA: Diagnosis not present

## 2021-02-28 DIAGNOSIS — M25511 Pain in right shoulder: Secondary | ICD-10-CM

## 2021-02-28 NOTE — Therapy (Signed)
[facility] [address]  [place], [place], [address] Phone: [phone]   Fax:  [phone]  Physical Therapy Treatment  Patient Details  Name: Madeline Mcdaniel MRN: [id number] Date of Birth: October 06, 1941 Referring Provider (PT): Lynne Leader, MD & Cathlean Cower, MD   Encounter Date: 02/28/2021   PT End of Session - 02/28/21 1444    Visit Number 8    Number of Visits 13    Date for PT Re-Evaluation 03/14/21    Authorization Type Medicare & Dory Horn    PT Start Time 1400    PT Stop Time 1443    PT Time Calculation (min) 43 min    Equipment Utilized During Treatment Gait belt    Activity Tolerance Patient tolerated treatment well    Behavior During Therapy [facility] for tasks assessed/performed           Past Medical History:  Diagnosis Date  . Allergic rhinitis, cause unspecified 04/27/2011  . Anemia 04/27/2011  . Asthma 04/27/2011  . Bipolar affective disorder ([place]) 04/27/2011  . Cervical spondylosis 06/01/2012  . Chronic bronchitis 04/27/2011  . Chronic cystitis 04/27/2011  . Colon polyps 04/27/2011  . Degenerative joint disease 04/27/2011  . GERD (gastroesophageal reflux disease) 04/27/2011  . HTN (hypertension) 04/27/2011  . Hyperlipidemia 04/27/2011  . Mild cognitive impairment 04/12/2013  . Osteopenia 06/01/2012  . 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 Right carpal tunnel syndrome 07/22/2012  . TIA (transient ischemic attack) 04/27/2011  . Urinary incontinence 04/27/2011    Past Surgical History:  Procedure Laterality Date  . BLADDER SUSPENSION     A&P REPAIR WITH MESH  . BREAST BIOPSY    . BREAST LUMPECTOMY     SEVERAL TIMES  . BUNIONECTOMY     LEFT FOOT  . CHOLECYSTECTOMY    . COLONOSCOPY  3YRS AGO   IN [place],PT DOES NOT REMEMBER DOCTOR  . Lauro Regulus Bunionectomy Left 09/09/2014   @ [facility]  . NEURECTOMY FOOT Left 09/09/2014   @ [place]  . ORTHOSCOPIC CARPETOMY     BOTH THUMBS  . POLYPECTOMY  10 YRS AGO    BENIGN POLYPS X2  . STERIOTACTIC BREAST BIOPSY     RIGHT  . TENDON TRANSFER  12/09/2012   Procedure: TENDON TRANSFER;  Surgeon: Wynonia Sours, MD;  Location: [facility];  Service: Orthopedics;  Laterality: Left;  Tenodesis PIP with FDS Slip Left ring finger, [place]uggerknot  . TONSILLECTOMY    . TOTAL ABDOMINAL HYSTERECTOMY    . TRIGGER FINGER RELEASE     LEFT RING FINGER    There were no vitals filed for this visit.   Subjective Assessment - 02/28/21 1405    Subjective Pt reports her shoulder isn't bothering her a lot.    Patient is accompained by: Family member    Pertinent History TIA 2012, R CTS, osteopenia, mild cognitive impairment, HLD, HTN, GERD, bipolar affective disorder, asthma, anemia, breast lumpectomy, L bunionectomy & neurectomy    Diagnostic tests 01/23/21 R shoulder xray: Negative.    Patient Stated Goals decrease pain and improve balance    Currently in Pain? No/denies                             [facility] Adult PT Treatment/Exercise - 02/28/21 0001      Ambulation/Gait   Ambulation Distance (Feet) 90 Feet    Assistive device Straight cane    Gait Pattern  Step-to pattern;Step-through pattern    Ambulation Surface Level;Indoor    Gait velocity decreased    Gait Comments pt showed difficulty with correct sequencing with SPC, requiring frequent redirection and cueing to correct gait      Shoulder Exercises: ROM/Strengthening   Nustep L4 x 6 min (UEs/LEs)               Balance Exercises - 02/28/21 0001      Balance Exercises: Standing   Standing Eyes Closed 30 secs;2 reps    Tandem Stance --   2nd set on airex; good posture no LOB   Tandem Gait 4 reps;Forward   2x LOB but able to recover herself   Sidestepping 2 reps   along the counter   Marching Upper extremity assist 1;10 reps   cues for upright posture   Other Standing Exercises step downs from airex 10 reps B    Other Standing Exercises Comments R/L toe tap on cone 10x   cues  to keep upright posture and to use hand assist to avoid trunk leaning              PT Short Term Goals - 02/24/21 1152      PT SHORT TERM GOAL #1   Title Patient to be independent with initial HEP.    Time 3    Period Weeks    Status Achieved    Target Date 02/21/21             PT Long Term Goals - 02/07/21 1628      PT LONG TERM GOAL #1   Title Patient to be independent with advanced HEP.    Time 6    Period Weeks    Status On-going      PT LONG TERM GOAL #2   Title Patient to demonstrate R shoulder AROM WFL and without pain limiting.    Time 6    Period Weeks    Status On-going      PT LONG TERM GOAL #3   Title Patient to demonstrate R shoulder strength >/=4+/5.    Time 6    Period Weeks    Status On-going      PT LONG TERM GOAL #4   Title Patient to score atleast 47/56 on berg in order to decrease risk of falls.    Time 6    Period Weeks    Status On-going      PT LONG TERM GOAL #5   Title Patient to report no dizziness with transfers or bed mobility.    Time 6    Period Weeks    Status On-going                 Plan - 02/28/21 1445    Clinical Impression Statement Pt responded well to treatment, her shoulder is doing better and not bothering her as much, she showed a good performance of the balance activities showing less LOB and using less hand support with better stability. Cues were given to keep upright posture and to intermittently use UE support to avoid excessive leaning w/ the trunk. She still shows confusion with sequencing with the cane and needed a lot of reminders of correct gait pattern. She had no reports of pain during session and performed exercises well once cued and corrected for performance.    Personal Factors and Comorbidities Age;Comorbidity 3+;Fitness;Past/Current Experience;Time since onset of injury/illness/exacerbation;Transportation    Comorbidities TIA 2012, R CTS, osteopenia, mild cognitive impairment, HLD, HTN,  GERD,  bipolar affective disorder, asthma, anemia, breast lumpectomy, L bunionectomy & neurectomy    PT Frequency 2x / week    PT Duration 6 weeks    PT Treatment/Interventions ADLs/Self Care Home Management;Canalith Repostioning;Cryotherapy;Electrical Stimulation;Iontophoresis 4mg /ml Dexamethasone;Moist Heat;Balance training;Therapeutic exercise;Therapeutic activities;Functional mobility training;Stair training;Gait training;DME Instruction;Ultrasound;Neuromuscular re-education;Cognitive remediation;Patient/family education;Manual techniques;Vestibular;Vasopneumatic Device;Taping;Energy conservation;Dry needling;Passive range of motion    PT Next Visit Plan balance training; progress postural and RTC strengthening    Consulted and Agree with Plan of Care Patient;Family member/caregiver    Family Member Consulted husband           Patient will benefit from skilled therapeutic intervention in order to improve the following deficits and impairments:  Abnormal gait,Decreased activity tolerance,Decreased strength,Increased fascial restricitons,Impaired UE functional use,Pain,Decreased balance,Difficulty walking,Increased muscle spasms,Improper body mechanics,Dizziness,Decreased range of motion,Decreased safety awareness,Postural dysfunction,Impaired flexibility  Visit Diagnosis: Acute pain of right shoulder  Unsteadiness on feet  Dizziness and giddiness  Repeated falls     Problem List Patient Active Problem List   Diagnosis Date Noted  . Recurrent falls 01/17/2021  . Right shoulder pain 01/17/2021  . Vitamin D deficiency 01/17/2021  . 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  . Dementia without behavioral disturbance ([place]) 01/12/2019  . Lumbar radiculopathy 01/06/2019  . Left hip pain 12/22/2018  . Memory dysfunction 01/30/2017  . Urinary frequency 01/19/2016  . Loss of weight 04/19/2015  . Dyspnea 04/06/2015  . Edema 12/28/2014  . Onychomycosis 08/31/2014  .  Pain in lower limb 08/31/2014  . 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  . Carpal tunnel syndrome, right 08/25/2014  . Bilateral bunions 08/25/2014  . Left foot pain 08/25/2013  . Fatigue 04/08/2013  . Right carpal tunnel syndrome 07/22/2012  . Brachial neuritis or radiculitis 07/01/2012  . Trigger finger, left 07/01/2012  . Osteopenia 06/01/2012  . Cervical spondylosis 06/01/2012  . Mild intermittent asthma 04/27/2011  . Degenerative joint disease 04/27/2011  . Bipolar affective disorder ([place]) 04/27/2011  . Chronic bronchitis 04/27/2011  . GERD (gastroesophageal reflux disease) 04/27/2011  . Allergic rhinitis, cause unspecified 04/27/2011  . HTN (hypertension) 04/27/2011  . Chronic cystitis 04/27/2011  . Colon polyps 04/27/2011  . Anemia 04/27/2011  . H/O: hysterectomy 04/27/2011  . TIA (transient ischemic attack) 04/27/2011  . Urinary incontinence 04/27/2011  . 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 Hyperlipidemia 04/27/2011  . Preventative health care 04/27/2011    Artist Pais, PTA 02/28/2021, 3:12 PM  [facility] [address]  [place], [place], [address] Phone: [phone]   Fax:  [phone]  Name: Madeline Mcdaniel MRN: [id number] Date of Birth: 06-29-1941

## 2021-03-03 ENCOUNTER — Other Ambulatory Visit: Payer: Self-pay

## 2021-03-03 ENCOUNTER — Ambulatory Visit: Payer: Medicare Other | Attending: Family Medicine | Admitting: Physical Therapy

## 2021-03-03 ENCOUNTER — Encounter: Payer: Self-pay | Admitting: Physical Therapy

## 2021-03-03 DIAGNOSIS — R42 Dizziness and giddiness: Secondary | ICD-10-CM | POA: Diagnosis not present

## 2021-03-03 DIAGNOSIS — R2689 Other abnormalities of gait and mobility: Secondary | ICD-10-CM | POA: Diagnosis not present

## 2021-03-03 DIAGNOSIS — M25511 Pain in right shoulder: Secondary | ICD-10-CM

## 2021-03-03 DIAGNOSIS — R2681 Unsteadiness on feet: Secondary | ICD-10-CM

## 2021-03-03 DIAGNOSIS — R296 Repeated falls: Secondary | ICD-10-CM

## 2021-03-03 DIAGNOSIS — M6281 Muscle weakness (generalized): Secondary | ICD-10-CM

## 2021-03-03 NOTE — Therapy (Signed)
[facility] [place] [address]  [address] [place], [place], [address] Phone: [phone]   Fax:  [phone]  Physical Therapy Discharge Summary  Patient Details  Name: Madeline Mcdaniel MRN: [id number] Date of Birth: 06/12/1941 Referring Provider (PT): Evan Corey, MD & James John, MD  Progress Note Reporting Period 01/31/21 to 03/03/21  See note below for Objective Data and Assessment of Progress/Goals.     Encounter Date: 03/03/2021   PT End of Session - 03/03/21 1053    Visit Number 9    Number of Visits 13    Date for PT Re-Evaluation 03/14/21    Authorization Type Medicare & [employer]    PT Start Time 1017    PT Stop Time 1050    PT Time Calculation (min) 33 min    Equipment Utilized During Treatment Gait belt    Activity Tolerance Patient tolerated treatment well    Behavior During Therapy WFL for tasks assessed/performed           Past Medical History:  Diagnosis Date  . Allergic rhinitis, cause unspecified 04/27/2011  . Anemia 04/27/2011  . Asthma 04/27/2011  . Bipolar affective disorder (HCC) 04/27/2011  . Cervical spondylosis 06/01/2012  . Chronic bronchitis 04/27/2011  . Chronic cystitis 04/27/2011  . Colon polyps 04/27/2011  . Degenerative joint disease 04/27/2011  . GERD (gastroesophageal reflux disease) 04/27/2011  . HTN (hypertension) 04/27/2011  . Hyperlipidemia 04/27/2011  . Mild cognitive impairment 04/12/2013  . Osteopenia 06/01/2012  . 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 Right carpal tunnel syndrome 07/22/2012  . TIA (transient ischemic attack) 04/27/2011  . Urinary incontinence 04/27/2011    Past Surgical History:  Procedure Laterality Date  . BLADDER SUSPENSION     A&P REPAIR WITH MESH  . BREAST BIOPSY    . BREAST LUMPECTOMY     SEVERAL TIMES  . BUNIONECTOMY     LEFT FOOT  . CHOLECYSTECTOMY    . COLONOSCOPY  10YRS AGO   IN [place],PT DOES NOT REMEMBER DOCTOR  . McBride Bunionectomy Left 09/09/2014   @ [facility]  . NEURECTOMY FOOT Left 09/09/2014   @ [facility]  . ORTHOSCOPIC CARPETOMY     BOTH THUMBS  . POLYPECTOMY  10 YRS AGO   BENIGN POLYPS X2  . STERIOTACTIC BREAST BIOPSY     RIGHT  . TENDON TRANSFER  12/09/2012   Procedure: TENDON TRANSFER;  Surgeon: Gary R Kuzma, MD;  Location: [facility];  Service: Orthopedics;  Laterality: Left;  [employer] PIP with FDS Slip Left ring finger, [place]  . TONSILLECTOMY    . TOTAL ABDOMINAL HYSTERECTOMY    . TRIGGER FINGER RELEASE     LEFT RING FINGER    There were no vitals filed for this visit.   Subjective Assessment - 03/03/21 1022    Subjective Husband and patient reporting no remaining issues with the R shoulder. Husband reports that patient's memory limits her use of the SPC and that patient gets irritated when husband assists her/corrects her.    Pertinent History TIA 2012, R CTS, osteopenia, mild cognitive impairment, HLD, HTN, GERD, bipolar affective disorder, asthma, anemia, breast lumpectomy, L bunionectomy & neurectomy    Diagnostic tests 01/23/21 R shoulder xray: Negative.    Patient Stated Goals decrease pain and improve balance    Currently in Pain? No/denies              [facility] PT Assessment - 03/03/21 0001        Assessment   Medical Diagnosis Acute pain of R shoulder, recurrent falls    Referring Provider (PT) Lynne Leader, MD & Cathlean Cower, MD    Onset Date/Surgical Date 01/16/21      AROM   Right Shoulder Flexion 146 Degrees    Right Shoulder ABduction 167 Degrees    Right Shoulder Internal Rotation --   FIR T11   Right Shoulder External Rotation --   FER T3     Strength   Right Shoulder Flexion 4+/5    Right Shoulder ABduction 4+/5    Right Shoulder Internal Rotation 4/5    Right Shoulder External Rotation 4+/5      Berg Balance Test   Sit to Stand Able to stand without using hands and stabilize independently    Standing Unsupported Able to stand safely 2 minutes    Sitting with Back Unsupported  but Feet Supported on Floor or Stool Able to sit safely and securely 2 minutes    Stand to Sit Sits safely with minimal use of hands    Transfers Able to transfer safely, minor use of hands    Standing Unsupported with Eyes Closed Able to stand 10 seconds safely    Standing Unsupported with Feet Together Able to place feet together independently and stand 1 minute safely    From Standing, Reach Forward with Outstretched Arm Can reach forward >12 cm safely (5")    From Standing Position, Pick up Object from Floor Able to pick up shoe safely and easily    From Standing Position, Turn to Look Behind Over each Shoulder Looks behind from both sides and weight shifts well    Turn 360 Degrees Able to turn 360 degrees safely but slowly    Standing Unsupported, Alternately Place Feet on Step/Stool Able to stand independently and safely and complete 8 steps in 20 seconds    Standing Unsupported, One Foot in Front Able to place foot tandem independently and hold 30 seconds    Standing on One Leg Able to lift leg independently and hold 5-10 seconds    Total Score 52                         [facility] Adult PT Treatment/Exercise - 03/03/21 0001      Shoulder Exercises: ROM/Strengthening   Nustep L4 x 6 min (UEs/LEs)                  PT Education - 03/03/21 1052    Education Details discussion with patient and husband on objective and subjective progress and remaining impairments; update/consolidation of HEP    Person(s) Educated Patient;Spouse    Methods Explanation;Demonstration;Tactile cues;Verbal cues;Handout    Comprehension Returned demonstration;Verbalized understanding            PT Short Term Goals - 03/03/21 1024      PT SHORT TERM GOAL #1   Title Patient to be independent with initial HEP.    Time 3    Period Weeks    Status Achieved    Target Date 02/21/21             PT Long Term Goals - 03/03/21 1024      PT LONG TERM GOAL #1   Title Patient to be  independent with advanced HEP.    Time 6    Period Weeks    Status Achieved      PT LONG TERM GOAL #2   Title Patient to demonstrate  R shoulder AROM WFL and without pain limiting.    Time 6    Period Weeks    Status Achieved      PT LONG TERM GOAL #3   Title Patient to demonstrate R shoulder strength >/=4+/5.    Time 6    Period Weeks    Status Partially Met   IR limiting     PT LONG TERM GOAL #4   Title Patient to score atleast 47/56 on berg in order to decrease risk of falls.    Time 6    Period Weeks    Status Achieved      PT LONG TERM GOAL #5   Title Patient to report no dizziness with transfers or bed mobility.    Time 6    Period Weeks    Status Achieved                 Plan - 03/03/21 1053    Clinical Impression Statement Patient and husband reporting that the patient is no longer having issues with the R shoulder. Assessed R shoulder strength, which demonstrated improvements in abduction and ER, now with only IR limiting. R shoulder AROM has improved in flexion and abduction and now with no pain. Patient has also met dizziness goal, as she no longer reports dizziness with bed mobility. Patient scored 52/56 on Berg, indicating decreased risk of falls. Patient still with slight difficulty with anterior weight shift, turning quickly, and SLS. Updated and consolidated HEP with exercises to address remaining balance deficits. Patient and husband reported understanding. Patient has met or partially met all goals at this time and is independent with HEP. Patient now ready for DC.    Personal Factors and Comorbidities Age;Comorbidity 3+;Fitness;Past/Current Experience;Time since onset of injury/illness/exacerbation;Transportation    Comorbidities TIA 2012, R CTS, osteopenia, mild cognitive impairment, HLD, HTN, GERD, bipolar affective disorder, asthma, anemia, breast lumpectomy, L bunionectomy & neurectomy    PT Frequency 2x / week    PT Duration 6 weeks    PT  Treatment/Interventions ADLs/Self Care Home Management;Canalith Repostioning;Cryotherapy;Electrical Stimulation;Iontophoresis 4mg/ml Dexamethasone;Moist Heat;Balance training;Therapeutic exercise;Therapeutic activities;Functional mobility training;Stair training;Gait training;DME Instruction;Ultrasound;Neuromuscular re-education;Cognitive remediation;Patient/family education;Manual techniques;Vestibular;Vasopneumatic Device;Taping;Energy conservation;Dry needling;Passive range of motion    PT Next Visit Plan DC    Consulted and Agree with Plan of Care Patient;Family member/caregiver    Family Member Consulted husband           Patient will benefit from skilled therapeutic intervention in order to improve the following deficits and impairments:  Abnormal gait,Decreased activity tolerance,Decreased strength,Increased fascial restricitons,Impaired UE functional use,Pain,Decreased balance,Difficulty walking,Increased muscle spasms,Improper body mechanics,Dizziness,Decreased range of motion,Decreased safety awareness,Postural dysfunction,Impaired flexibility  Visit Diagnosis: Acute pain of right shoulder  Unsteadiness on feet  Dizziness and giddiness  Repeated falls  Other abnormalities of gait and mobility  Muscle weakness (generalized)     Problem List Patient Active Problem List   Diagnosis Date Noted  . Recurrent falls 01/17/2021  . Right shoulder pain 01/17/2021  . Vitamin D deficiency 01/17/2021  . 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  . Dementia without behavioral disturbance (HCC) 01/12/2019  . Lumbar radiculopathy 01/06/2019  . Left hip pain 12/22/2018  . Memory dysfunction 01/30/2017  . Urinary frequency 01/19/2016  . Loss of weight 04/19/2015  . Dyspnea 04/06/2015  . Edema 12/28/2014  . Onychomycosis 08/31/2014  . Pain in lower limb 08/31/2014  . 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  . Carpal tunnel syndrome, right 08/25/2014  .  Bilateral bunions 08/25/2014  .   Left foot pain 08/25/2013  . Fatigue 04/08/2013  . Right carpal tunnel syndrome 07/22/2012  . Brachial neuritis or radiculitis 07/01/2012  . Trigger finger, left 07/01/2012  . Osteopenia 06/01/2012  . Cervical spondylosis 06/01/2012  . Mild intermittent asthma 04/27/2011  . Degenerative joint disease 04/27/2011  . Bipolar affective disorder ([place]) 04/27/2011  . Chronic bronchitis 04/27/2011  . GERD (gastroesophageal reflux disease) 04/27/2011  . Allergic rhinitis, cause unspecified 04/27/2011  . HTN (hypertension) 04/27/2011  . Chronic cystitis 04/27/2011  . Colon polyps 04/27/2011  . Anemia 04/27/2011  . H/O: hysterectomy 04/27/2011  . TIA (transient ischemic attack) 04/27/2011  . Urinary incontinence 04/27/2011  . 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 Hyperlipidemia 04/27/2011  . Preventative health care 04/27/2011      PHYSICAL THERAPY DISCHARGE SUMMARY  Visits from Start of Care: 13  Current functional level related to goals / functional outcomes: See above clinical impression   Remaining deficits: Slight imbalance, R shoulder IR weakness   Education / Equipment: HEP  Plan: Patient agrees to discharge.  Patient goals were partially met. Patient is being discharged due to meeting the stated rehab goals.  ?????     Janene Harvey, PT, DPT 03/03/21 10:58 AM   [facility] [address]  [place], [place], [address] Phone: [phone]   Fax:  [phone]  Name: Madeline Mcdaniel MRN: [id number] Date of Birth: [birth year]

## 2021-03-06 ENCOUNTER — Ambulatory Visit (INDEPENDENT_AMBULATORY_CARE_PROVIDER_SITE_OTHER): Payer: Medicare Other | Admitting: Family Medicine

## 2021-03-06 ENCOUNTER — Other Ambulatory Visit: Payer: Self-pay

## 2021-03-06 ENCOUNTER — Encounter: Payer: Self-pay | Admitting: Family Medicine

## 2021-03-06 VITALS — BP 120/74 | HR 55 | Ht <= 58 in | Wt 95.2 lb

## 2021-03-06 DIAGNOSIS — M25511 Pain in right shoulder: Secondary | ICD-10-CM

## 2021-03-06 NOTE — Progress Notes (Signed)
   I, Peterson Lombard, LAT, ATC acting as a scribe for Lynne Leader, MD.  Madeline Mcdaniel is a 80 y.o. female who presents to [place] at [facility] today for f/u R shoulder pain that she injured 01/16/21 when becoming off-balance and falling onto R side. Pt was last seen by Dr. Georgina Snell on 01/23/21 and was advised to use Voltaren gel and was referred to home health PT of which she's completed 9 visits. Today, pt reports that her R shoulder is better, rating her improvement near 100%.  She con't to do her HEP.  Dx imaging: 01/23/21 R shoulder XR  Pertinent review of systems: No fevers or chills  Relevant historical information: Dementia   Exam:  BP 120/74 (BP Location: Right Arm, Patient Position: Sitting, Cuff Size: Normal)   Pulse (!) 55   Ht 4\' 9"  (1.448 m)   Wt 95 lb 3.2 oz (43.2 kg)   SpO2 96%   BMI 20.60 kg/m  General: Well Developed, well nourished, and in no acute distress.   MSK: Right shoulder normal-appearing normal motion intact strength     Assessment and Plan: 80 y.o. female with right shoulder pain dramatically improved with physical therapy.  Patient is at baseline at this point.  Continue home exercise program.  If pain should return may consider repeat physical therapy.  Recheck back as needed.  Discussed with husband plan as well who expresses understanding and agreement.   Discussed warning signs or symptoms. Please see discharge instructions. Patient expresses understanding.   The above documentation has been reviewed and is accurate and complete Lynne Leader, M.D.  Total encounter time 20 minutes including face-to-face time with the patient and, reviewing past medical record, and charting on the date of service.   As above treatment plan and options.

## 2021-03-06 NOTE — Patient Instructions (Signed)
Thank you for coming in today.   Continue home exercises.   Recheck as needed.  

## 2021-03-22 ENCOUNTER — Other Ambulatory Visit: Payer: Self-pay

## 2021-03-22 ENCOUNTER — Ambulatory Visit (INDEPENDENT_AMBULATORY_CARE_PROVIDER_SITE_OTHER): Payer: Medicare Other

## 2021-03-22 VITALS — BP 102/70 | HR 55 | Temp 98.3°F | Ht <= 58 in | Wt 94.4 lb

## 2021-03-22 DIAGNOSIS — Z0000 Encounter for general adult medical examination without abnormal findings: Secondary | ICD-10-CM

## 2021-03-22 NOTE — Progress Notes (Signed)
Subjective:   Madeline Mcdaniel is a 80 y.o. female who presents for Medicare Annual (Subsequent) preventive examination.  Review of Systems    No ROS. Medicare Wellness Visit. Additional risk factors are reflected in social history. Cardiac Risk Factors include: advanced age (>8men, >58 women)     Objective:    Today's Vitals   03/22/21 1227  BP: 102/70  Pulse: (!) 55  Temp: 98.3 F (36.8 C)  SpO2: 96%  Weight: 94 lb 6.4 oz (42.8 kg)  Height: 4\' 9"  (1.448 m)  PainSc: 0-No pain   Body mass index is 20.43 kg/m.  Advanced Directives 03/22/2021 01/31/2021 12/23/2020 12/16/2017 01/30/2016 09/29/2015 12/04/2012  Does Patient Have a Medical Advance Directive? Yes Yes Yes Yes Yes Yes Patient has advance directive, copy not in chart  Type of Advance Directive Living will;Healthcare Power of [place];Living will [place];Living will - Living will -  Does patient want to make changes to medical advance directive? No - Patient declined No - Patient declined - No - Patient declined - - -  Copy of Center in Chart? No - copy requested - - No - copy requested No - copy requested No - copy requested -    Current Medications (verified) Outpatient Encounter Medications as of 03/22/2021  Medication Sig  . acetaminophen (TYLENOL) 500 MG tablet Take 500 mg by mouth as needed.  Marland Kitchen CALCIUM PO Take by mouth as directed. Reported on 01/30/2016 (Patient not taking: No sig reported)  . Cholecalciferol (THERA-D 2000) 50 MCG (2000 UT) TABS 1 tab by mouth once daily (Patient taking differently: Take 50 tablets by mouth daily. 1 tab by mouth once daily)  . Coenzyme Q10 (CO Q 10 PO) Take 200 mg by mouth daily.  . Cranberry 500 MG CAPS Take by mouth 2 (two) times daily. (Patient not taking: No sig reported)  . Fluticasone-Salmeterol (ADVAIR DISKUS IN) Inhale 1 Inhaler into the lungs as needed. 100/50  . gabapentin (NEURONTIN) 300 MG capsule TAKE  1 CAPSULE BY MOUTH EVERY MORNING AND TAKE 2 CAPSULES BY MOUTH EVERY EVENING  . hydrochlorothiazide (HYDRODIURIL) 12.5 MG tablet Take 1 tablet (12.5 mg total) by mouth daily.  . memantine (NAMENDA) 10 MG tablet Take 1 tablet twice a day  . nitrofurantoin (MACRODANTIN) 50 MG capsule 1 by mouth twice per day as needed  . potassium chloride (KLOR-CON 10) 10 MEQ tablet Take 1 tablet (10 mEq total) by mouth daily for 5 days.  . potassium chloride (MICRO-K) 10 MEQ CR capsule Take 10 mEq by mouth daily.  . rivastigmine (EXELON) 1.5 MG capsule TAKE ONE CAPSULE BY MOUTH TWICE A DAY  . rivastigmine (EXELON) 3 MG capsule Take 1 capsule (3 mg total) by mouth 2 (two) times daily.  . solifenacin (VESICARE) 5 MG tablet TAKE 1 TABLET(5 MG) BY MOUTH DAILY   No facility-administered encounter medications on file as of 03/22/2021.    Allergies (verified) Aspirin, Nsaids, Vioxx [rofecoxib], and Ciprofloxacin   History: Past Medical History:  Diagnosis Date  . Allergic rhinitis, cause unspecified 04/27/2011  . Anemia 04/27/2011  . Asthma 04/27/2011  . Bipolar affective disorder ([place]) 04/27/2011  . Cervical spondylosis 06/01/2012  . Chronic bronchitis 04/27/2011  . Chronic cystitis 04/27/2011  . Colon polyps 04/27/2011  . Degenerative joint disease 04/27/2011  . GERD (gastroesophageal reflux disease) 04/27/2011  . HTN (hypertension) 04/27/2011  . Hyperlipidemia 04/27/2011  . Mild cognitive impairment 04/12/2013  . Osteopenia 06/01/2012  .  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 Right carpal tunnel syndrome 07/22/2012  . TIA (transient ischemic attack) 04/27/2011  . Urinary incontinence 04/27/2011   Past Surgical History:  Procedure Laterality Date  . BLADDER SUSPENSION     A&P REPAIR WITH MESH  . BREAST BIOPSY    . BREAST LUMPECTOMY     SEVERAL TIMES  . BUNIONECTOMY     LEFT FOOT  . CHOLECYSTECTOMY    . COLONOSCOPY  25YRS AGO   IN [place],PT DOES NOT REMEMBER DOCTOR  . Lauro Regulus Bunionectomy Left 09/09/2014   @ [facility]  . NEURECTOMY FOOT Left 09/09/2014   @ [place]  . ORTHOSCOPIC CARPETOMY     BOTH THUMBS  . POLYPECTOMY  10 YRS AGO   BENIGN POLYPS X2  . STERIOTACTIC BREAST BIOPSY     RIGHT  . TENDON TRANSFER  12/09/2012   Procedure: TENDON TRANSFER;  Surgeon: Wynonia Sours, MD;  Location: [facility];  Service: Orthopedics;  Laterality: Left;  [place] PIP with FDS Slip Left ring finger, [place]  . TONSILLECTOMY    . TOTAL ABDOMINAL HYSTERECTOMY    . TRIGGER FINGER RELEASE     LEFT RING FINGER   Family History  Problem Relation Age of Onset  . Cancer Mother 28       RENAL CELL CARCINOMA  . Colon cancer Paternal Grandfather   . Breast cancer Neg Hx    Social History   Socioeconomic History  . Marital status: Married    Spouse name: Not on file  . Number of children: 2  . Years of education: Not on file  . Highest education level: Not on file  Occupational History  . Not on file  Tobacco Use  . Smoking status: Never Smoker  . Smokeless tobacco: Never Used  Substance and Sexual Activity  . Alcohol use: No  . Drug use: No  . Sexual activity: Not Currently  Other Topics Concern  . Not on file  Social History Narrative  . Not on file   Social Determinants of Health   Financial Resource Strain: Low Risk   . Difficulty of Paying Living Expenses: Not hard at all  Food Insecurity: No Food Insecurity  . Worried About Charity fundraiser in the Last Year: Never true  . Ran Out of Food in the Last Year: Never true  Transportation Needs: No Transportation Needs  . Lack of Transportation (Medical): No  . Lack of Transportation (Non-Medical): No  Physical Activity: Insufficiently Active  . Days of Exercise per Week: 7 days  . Minutes of Exercise per Session: 20 min  Stress: No Stress Concern Present  . Feeling of Stress : Not at all  Social Connections: Socially Integrated  . Frequency of Communication with Friends and Family: More than three times a week  .  Frequency of Social Gatherings with Friends and Family: Once a week  . Attends Religious Services: 1 to 4 times per year  . Active Member of Clubs or Organizations: No  . Attends Archivist Meetings: 1 to 4 times per year  . 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 no  Interpreter Needed?: No  Information entered by :: [employer] LPN   Activities of Daily Living In your present state of health, do you have any difficulty performing the following activities: 03/22/2021 01/17/2021  Hearing? N N  Vision? N N  Difficulty concentrating or making decisions? Tempie Donning  Walking or climbing stairs? Y N  Dressing or bathing? Y N  Doing errands, shopping? Y N  Preparing Food and eating ? Y -  Using the Toilet? Y -  In the past six months, have you accidently leaked urine? Y -  Do you have problems with loss of bowel control? N -  Managing your Medications? Y -  Managing your Finances? Y -  Housekeeping or managing your Housekeeping? Y -  Some recent data might be hidden    Patient Care Team: Biagio Borg, MD as PCP - General (Internal Medicine)  Indicate any recent Medical Services you may have received from other than [facility] providers in the past year (date may be approximate).     Assessment:   This is a routine wellness examination for [place].  Hearing/Vision screen No exam data present  Dietary issues and exercise activities discussed: Current Exercise Habits: Home exercise routine, Time  (Minutes): 20, Frequency (Times/Week): 7, Weekly Exercise (Minutes/Week): 140, Intensity: Mild, Exercise limited by: orthopedic condition(s);respiratory conditions(s);psychological condition(s);cardiac condition(s)  Goals    .  maintain health (pt-stated)      Would like to maintain memory and health  Staying engaged;       Depression Screen PHQ 2/9 Scores 03/22/2021 01/17/2021 01/14/2020 01/12/2019 01/30/2018 01/30/2017 01/30/2016  PHQ - 2 Score 0 0 0 0 0 1 0  PHQ- 9 Score - - - - 0 - -    Fall Risk Fall Risk  03/22/2021 01/17/2021 12/23/2020 01/14/2020 01/12/2019  Falls in the past year? 1 1 1  0 1  Number falls in past yr: 1 1 1  - 0  Injury with Fall? 1 1 0 - -  Comment - - - - -  Risk for fall due to : History of fall(s);Impaired balance/gait;Mental status change - - - -  Follow up Falls evaluation completed - - - -    FALL RISK PREVENTION PERTAINING TO THE HOME:  Any stairs in or around the home? Yes  If so, are there any without handrails? No  Home free of loose throw rugs in walkways, pet beds, electrical cords, etc? Yes  Adequate lighting in your home to reduce risk of falls? Yes   ASSISTIVE DEVICES UTILIZED TO PREVENT FALLS:  Life alert? No  Use of a cane, walker or w/c? Yes  Grab bars in the bathroom? No  Shower chair or bench in shower? Yes  Elevated toilet seat or a handicapped toilet? Yes   TIMED UP AND GO:  Was the test performed? No .  Length of time to ambulate 10 feet: 0 sec.   Gait steady and fast with assistive device  Cognitive Function: MMSE - Mini Mental State Exam 01/30/2016 01/30/2016 01/19/2016  Not completed: (No Data) - (No Data)  Orientation to time 5 5 -  Orientation to Place 5 5 -  Registration - 3 -  Attention/ Calculation 3 3 -  Recall 3 3 -  Language- name 2 objects 2 2 -  Language- repeat 1 1 -  Language- follow 3  step command 3 1 -  Language- read & follow direction 1 1 -  Write a sentence 1 1 -  Copy design 1 1 -  Total score - 26 -    Montreal Cognitive Assessment  11/19/2018 10/22/2017 04/10/2017  Visuospatial/ Executive (0/5) 1 5 5   Naming (0/3) 2 3 3   Attention: Read list of digits (0/2) 2 2 2   Attention: Read list of letters (0/1) 0 1 1  Attention: Serial 7 subtraction starting at 100 (0/3) 0 2 1  Language: Repeat phrase (0/2) 1 2 1   Language : Fluency (0/1) 0 0 0  Abstraction (0/2) 1 2 2   Delayed Recall (0/5) 0 4 2  Orientation (0/6) 6 6 6   Total 13 27 23       Immunizations Immunization History  Administered Date(s) Administered  . DTaP 09/18/2009  . Fluad Quad(high Dose 65+) 08/07/2019  . Influenza Split 08/30/2011, 09/16/2012  . Influenza, High Dose Seasonal PF 07/31/2017  . Influenza,inj,Quad PF,6+ Mos 08/25/2013, 08/25/2014, 08/16/2015, 07/31/2016  . PFIZER(Purple Top)SARS-COV-2 Vaccination 12/15/2019, 01/04/2020  . Pneumococcal Conjugate-13 12/03/2006, 10/01/2013  . Pneumococcal Polysaccharide-23 12/03/2009  . Tetanus 04/08/2013  . Zoster 12/03/2009    TDAP status: Up to date  Flu Vaccine status: Up to date  Pneumococcal vaccine status: Up to date  Covid-19 vaccine status: Completed vaccines  Qualifies for Shingles Vaccine? Yes   Zostavax completed Yes   Shingrix Completed?: Yes  Screening Tests Health Maintenance  Topic Date Due  . COVID-19 Vaccine (3 - Booster for Pfizer series) 07/03/2020  . INFLUENZA VACCINE  07/03/2021  . TETANUS/TDAP  04/09/2023  . DEXA SCAN  Completed  . PNA vac Low Risk Adult  Completed  . HPV VACCINES  Aged Out    Health Maintenance  Health Maintenance Due  Topic Date Due  . COVID-19 Vaccine (3 - Booster for Pfizer series) 07/03/2020    Colorectal cancer screening: No longer required.   Mammogram status: Completed 10/12/2020. Repeat every year  Bone Density status: Completed 01/30/2016. Results reflect: Bone density results: OSTEOPOROSIS. Repeat every 0 years.  Lung Cancer Screening: (Low Dose CT Chest recommended if Age 39-80 years, 30  pack-year currently smoking OR have quit w/in 15years.) does not qualify.   Lung Cancer Screening Referral: no  Additional Screening:  Hepatitis C Screening: does not qualify; Completed no  Vision Screening: Recommended annual ophthalmology exams for early detection of glaucoma and other disorders of the eye. Is the patient up to date with their annual eye exam?  yes Who is the provider or what is the name of the office in which the patient attends annual eye exams? Dr. Joyice Faster. Luan Pulling, MD If pt is not established with a provider, would they like to be referred to a provider to establish care? No .   Dental Screening: Recommended annual dental exams for proper oral hygiene  Community Resource Referral / Chronic Care Management: CRR required this visit?  No   CCM required this visit?  No      Plan:     I have personally reviewed and noted the following in the patient's chart:   . Medical and social history . Use of alcohol, tobacco or illicit drugs  . Current medications and supplements . Functional ability and status . Nutritional status . Physical activity . Advanced directives . List of other physicians . Hospitalizations, surgeries, and ER visits in previous 12 months . Vitals . Screenings to include cognitive, depression, and falls . Referrals and appointments  In  addition, I have reviewed and discussed with patient certain preventive protocols, quality metrics, and best practice recommendations. A written personalized care plan for preventive services as well as general preventive health recommendations were provided to patient.     Sheral Flow, LPN   03/11/8118   Nurse Notes:  Patient has current diagnosis of cognitive impairment.  Patient is unable to complete screening 6CIT or MMSE.  Medications reviewed with patient; no opioid use noted.

## 2021-03-22 NOTE — Patient Instructions (Signed)
Madeline Mcdaniel , Thank you for taking time to come for your Medicare Wellness Visit. I appreciate your ongoing commitment to your health goals. Please review the following plan we discussed and let me know if I can assist you in the future.   Screening recommendations/referrals: Colonoscopy: not a candidate for colon cancer screening due to age 80: 10/12/2020 Bone Density: 01/30/2016; completed, no longer recommended Recommended yearly ophthalmology/optometry visit for glaucoma screening and checkup Recommended yearly dental visit for hygiene and checkup  Vaccinations: Influenza vaccine: 08/22/2020 Pneumococcal vaccine: 12/03/2010, 10/01/2013 Tdap vaccine: 04/08/2013; due every 10 years Shingles vaccine: 08/13/2017, 02/07/2018 Covid-19: 12/15/2019, 01/04/2020, 08/31/2020   Advanced directives: Please bring a copy of your health care power of attorney and living will to the office at your convenience.  Conditions/risks identified: Yes; Reviewed health maintenance screenings with patient today and relevant education, vaccines, and/or referrals were provided. Please continue to do your personal lifestyle choices by: daily care of teeth and gums, regular physical activity (goal should be 5 days a week for 30 minutes), eat a healthy diet, avoid tobacco and drug use, limiting any alcohol intake, taking a low-dose aspirin (if not allergic or have been advised by your provider otherwise) and taking vitamins and minerals as recommended by your provider. Continue doing brain stimulating activities (puzzles, reading, adult coloring books, staying active) to keep memory sharp. Continue to eat heart healthy diet (full of fruits, vegetables, whole grains, lean protein, water--limit salt, fat, and sugar intake) and increase physical activity as tolerated.  Next appointment: Please schedule your next Medicare Wellness Visit with your Nurse Health Advisor in 1 year by calling [phone].   Preventive Care 46 Years and  Older, Female Preventive care refers to lifestyle choices and visits with your health care provider that can promote health and wellness. What does preventive care include?  A yearly physical exam. This is also called an annual well check.  Dental exams once or twice a year.  Routine eye exams. Ask your health care provider how often you should have your eyes checked.  Personal lifestyle choices, including:  Daily care of your teeth and gums.  Regular physical activity.  Eating a healthy diet.  Avoiding tobacco and drug use.  Limiting alcohol use.  Practicing safe sex.  Taking low-dose aspirin every day.  Taking vitamin and mineral supplements as recommended by your health care provider. What happens during an annual well check? The services and screenings done by your health care provider during your annual well check will depend on your age, overall health, lifestyle risk factors, and family history of disease. Counseling  Your health care provider may ask you questions about your:  Alcohol use.  Tobacco use.  Drug use.  Emotional well-being.  Home and relationship well-being.  Sexual activity.  Eating habits.  History of falls.  Memory and ability to understand (cognition).  Work and work Statistician.  Reproductive health. Screening  You may have the following tests or measurements:  Height, weight, and BMI.  Blood pressure.  Lipid and cholesterol levels. These may be checked every 5 years, or more frequently if you are over 81 years old.  Skin check.  Lung cancer screening. You may have this screening every year starting at age 82 if you have a 30-pack-year history of smoking and currently smoke or have quit within the past 15 years.  Fecal occult blood test (FOBT) of the stool. You may have this test every year starting at age 57.  Flexible sigmoidoscopy or colonoscopy.  You may have a sigmoidoscopy every 5 years or a colonoscopy every 10 years  starting at age 64.  Hepatitis C blood test.  Hepatitis B blood test.  Sexually transmitted disease (STD) testing.  Diabetes screening. This is done by checking your blood sugar (glucose) after you have not eaten for a while (fasting). You may have this done every 1-3 years.  Bone density scan. This is done to screen for osteoporosis. You may have this done starting at age 74.  Mammogram. This may be done every 1-2 years. Talk to your health care provider about how often you should have regular mammograms. Talk with your health care provider about your test results, treatment options, and if necessary, the need for more tests. Vaccines  Your health care provider may recommend certain vaccines, such as:  Influenza vaccine. This is recommended every year.  Tetanus, diphtheria, and acellular pertussis (Tdap, Td) vaccine. You may need a Td booster every 10 years.  Zoster vaccine. You may need this after age 65.  Pneumococcal 13-valent conjugate (PCV13) vaccine. One dose is recommended after age 42.  Pneumococcal polysaccharide (PPSV23) vaccine. One dose is recommended after age 13. Talk to your health care provider about which screenings and vaccines you need and how often you need them. This information is not intended to replace advice given to you by your health care provider. Make sure you discuss any questions you have with your health care provider. Document Released: 12/16/2015 Document Revised: 08/08/2016 Document Reviewed: 09/20/2015 Elsevier Interactive Patient Education  2017 [place] Prevention in the Home Falls can cause injuries. They can happen to people of all ages. There are many things you can do to make your home safe and to help prevent falls. What can I do on the outside of my home?  Regularly fix the edges of walkways and driveways and fix any cracks.  Remove anything that might make you trip as you walk through a door, such as a raised step or  threshold.  Trim any bushes or trees on the path to your home.  Use bright outdoor lighting.  Clear any walking paths of anything that might make someone trip, such as rocks or tools.  Regularly check to see if handrails are loose or broken. Make sure that both sides of any steps have handrails.  Any raised decks and porches should have guardrails on the edges.  Have any leaves, snow, or ice cleared regularly.  Use sand or salt on walking paths during winter.  Clean up any spills in your garage right away. This includes oil or grease spills. What can I do in the bathroom?  Use night lights.  Install grab bars by the toilet and in the tub and shower. Do not use towel bars as grab bars.  Use non-skid mats or decals in the tub or shower.  If you need to sit down in the shower, use a plastic, non-slip stool.  Keep the floor dry. Clean up any water that spills on the floor as soon as it happens.  Remove soap buildup in the tub or shower regularly.  Attach bath mats securely with double-sided non-slip rug tape.  Do not have throw rugs and other things on the floor that can make you trip. What can I do in the bedroom?  Use night lights.  Make sure that you have a light by your bed that is easy to reach.  Do not use any sheets or blankets that are too big  for your bed. They should not hang down onto the floor.  Have a firm chair that has side arms. You can use this for support while you get dressed.  Do not have throw rugs and other things on the floor that can make you trip. What can I do in the kitchen?  Clean up any spills right away.  Avoid walking on wet floors.  Keep items that you use a lot in easy-to-reach places.  If you need to reach something above you, use a strong step stool that has a grab bar.  Keep electrical cords out of the way.  Do not use floor polish or wax that makes floors slippery. If you must use wax, use non-skid floor wax.  Do not have  throw rugs and other things on the floor that can make you trip. What can I do with my stairs?  Do not leave any items on the stairs.  Make sure that there are handrails on both sides of the stairs and use them. Fix handrails that are broken or loose. Make sure that handrails are as long as the stairways.  Check any carpeting to make sure that it is firmly attached to the stairs. Fix any carpet that is loose or worn.  Avoid having throw rugs at the top or bottom of the stairs. If you do have throw rugs, attach them to the floor with carpet tape.  Make sure that you have a light switch at the top of the stairs and the bottom of the stairs. If you do not have them, ask someone to add them for you. What else can I do to help prevent falls?  Wear shoes that:  Do not have high heels.  Have rubber bottoms.  Are comfortable and fit you well.  Are closed at the toe. Do not wear sandals.  If you use a stepladder:  Make sure that it is fully opened. Do not climb a closed stepladder.  Make sure that both sides of the stepladder are locked into place.  Ask someone to hold it for you, if possible.  Clearly mark and make sure that you can see:  Any grab bars or handrails.  First and last steps.  Where the edge of each step is.  Use tools that help you move around (mobility aids) if they are needed. These include:  Canes.  Walkers.  Scooters.  Crutches.  Turn on the lights when you go into a dark area. Replace any light bulbs as soon as they burn out.  Set up your furniture so you have a clear path. Avoid moving your furniture around.  If any of your floors are uneven, fix them.  If there are any pets around you, be aware of where they are.  Review your medicines with your doctor. Some medicines can make you feel dizzy. This can increase your chance of falling. Ask your doctor what other things that you can do to help prevent falls. This information is not intended to  replace advice given to you by your health care provider. Make sure you discuss any questions you have with your health care provider. Document Released: 09/15/2009 Document Revised: 04/26/2016 Document Reviewed: 12/24/2014 Elsevier Interactive Patient Education  2017 [employer].

## 2021-04-01 DIAGNOSIS — Z23 Encounter for immunization: Secondary | ICD-10-CM | POA: Diagnosis not present

## 2021-04-15 DIAGNOSIS — D62 Acute posthemorrhagic anemia: Secondary | ICD-10-CM | POA: Diagnosis not present

## 2021-04-15 DIAGNOSIS — K5900 Constipation, unspecified: Secondary | ICD-10-CM | POA: Insufficient documentation

## 2021-04-15 DIAGNOSIS — D696 Thrombocytopenia, unspecified: Secondary | ICD-10-CM | POA: Diagnosis present

## 2021-04-15 DIAGNOSIS — M6281 Muscle weakness (generalized): Secondary | ICD-10-CM | POA: Diagnosis not present

## 2021-04-15 DIAGNOSIS — W1839XA Other fall on same level, initial encounter: Secondary | ICD-10-CM | POA: Diagnosis not present

## 2021-04-15 DIAGNOSIS — Z9049 Acquired absence of other specified parts of digestive tract: Secondary | ICD-10-CM | POA: Diagnosis not present

## 2021-04-15 DIAGNOSIS — Z20822 Contact with and (suspected) exposure to covid-19: Secondary | ICD-10-CM | POA: Diagnosis not present

## 2021-04-15 DIAGNOSIS — Z8249 Family history of ischemic heart disease and other diseases of the circulatory system: Secondary | ICD-10-CM | POA: Diagnosis not present

## 2021-04-15 DIAGNOSIS — F329 Major depressive disorder, single episode, unspecified: Secondary | ICD-10-CM | POA: Diagnosis not present

## 2021-04-15 DIAGNOSIS — S72002A Fracture of unspecified part of neck of left femur, initial encounter for closed fracture: Secondary | ICD-10-CM | POA: Diagnosis not present

## 2021-04-15 DIAGNOSIS — S72145A Nondisplaced intertrochanteric fracture of left femur, initial encounter for closed fracture: Secondary | ICD-10-CM | POA: Insufficient documentation

## 2021-04-15 DIAGNOSIS — E876 Hypokalemia: Secondary | ICD-10-CM | POA: Diagnosis not present

## 2021-04-15 DIAGNOSIS — K219 Gastro-esophageal reflux disease without esophagitis: Secondary | ICD-10-CM | POA: Diagnosis present

## 2021-04-15 DIAGNOSIS — Z7983 Long term (current) use of bisphosphonates: Secondary | ICD-10-CM | POA: Diagnosis not present

## 2021-04-15 DIAGNOSIS — S72092A Other fracture of head and neck of left femur, initial encounter for closed fracture: Secondary | ICD-10-CM | POA: Diagnosis not present

## 2021-04-15 DIAGNOSIS — Z8719 Personal history of other diseases of the digestive system: Secondary | ICD-10-CM | POA: Diagnosis not present

## 2021-04-15 DIAGNOSIS — Z833 Family history of diabetes mellitus: Secondary | ICD-10-CM | POA: Diagnosis not present

## 2021-04-15 DIAGNOSIS — S72002D Fracture of unspecified part of neck of left femur, subsequent encounter for closed fracture with routine healing: Secondary | ICD-10-CM | POA: Diagnosis not present

## 2021-04-15 DIAGNOSIS — R410 Disorientation, unspecified: Secondary | ICD-10-CM | POA: Diagnosis not present

## 2021-04-15 DIAGNOSIS — F319 Bipolar disorder, unspecified: Secondary | ICD-10-CM | POA: Diagnosis present

## 2021-04-15 DIAGNOSIS — I10 Essential (primary) hypertension: Secondary | ICD-10-CM | POA: Diagnosis present

## 2021-04-15 DIAGNOSIS — Z90710 Acquired absence of both cervix and uterus: Secondary | ICD-10-CM | POA: Diagnosis not present

## 2021-04-15 DIAGNOSIS — Z823 Family history of stroke: Secondary | ICD-10-CM | POA: Diagnosis not present

## 2021-04-15 DIAGNOSIS — Z79899 Other long term (current) drug therapy: Secondary | ICD-10-CM | POA: Diagnosis not present

## 2021-04-15 DIAGNOSIS — R2689 Other abnormalities of gait and mobility: Secondary | ICD-10-CM | POA: Diagnosis not present

## 2021-04-15 DIAGNOSIS — F0390 Unspecified dementia without behavioral disturbance: Secondary | ICD-10-CM | POA: Diagnosis present

## 2021-04-15 DIAGNOSIS — Z741 Need for assistance with personal care: Secondary | ICD-10-CM | POA: Diagnosis not present

## 2021-04-15 DIAGNOSIS — I491 Atrial premature depolarization: Secondary | ICD-10-CM | POA: Diagnosis not present

## 2021-04-15 DIAGNOSIS — Y998 Other external cause status: Secondary | ICD-10-CM | POA: Diagnosis not present

## 2021-04-15 DIAGNOSIS — Z87440 Personal history of urinary (tract) infections: Secondary | ICD-10-CM | POA: Diagnosis not present

## 2021-04-19 DIAGNOSIS — I10 Essential (primary) hypertension: Secondary | ICD-10-CM | POA: Diagnosis not present

## 2021-04-19 DIAGNOSIS — M6281 Muscle weakness (generalized): Secondary | ICD-10-CM | POA: Diagnosis not present

## 2021-04-19 DIAGNOSIS — S72002A Fracture of unspecified part of neck of left femur, initial encounter for closed fracture: Secondary | ICD-10-CM | POA: Diagnosis not present

## 2021-04-19 DIAGNOSIS — R2689 Other abnormalities of gait and mobility: Secondary | ICD-10-CM | POA: Diagnosis not present

## 2021-04-19 DIAGNOSIS — K219 Gastro-esophageal reflux disease without esophagitis: Secondary | ICD-10-CM | POA: Diagnosis not present

## 2021-04-19 DIAGNOSIS — Z741 Need for assistance with personal care: Secondary | ICD-10-CM | POA: Diagnosis not present

## 2021-04-19 DIAGNOSIS — F329 Major depressive disorder, single episode, unspecified: Secondary | ICD-10-CM | POA: Diagnosis not present

## 2021-04-19 DIAGNOSIS — S72002D Fracture of unspecified part of neck of left femur, subsequent encounter for closed fracture with routine healing: Secondary | ICD-10-CM | POA: Diagnosis not present

## 2021-04-19 DIAGNOSIS — F0390 Unspecified dementia without behavioral disturbance: Secondary | ICD-10-CM | POA: Diagnosis not present

## 2021-04-19 DIAGNOSIS — R410 Disorientation, unspecified: Secondary | ICD-10-CM | POA: Diagnosis not present

## 2021-04-21 DIAGNOSIS — I10 Essential (primary) hypertension: Secondary | ICD-10-CM | POA: Diagnosis not present

## 2021-04-21 DIAGNOSIS — R4181 Age-related cognitive decline: Secondary | ICD-10-CM | POA: Diagnosis not present

## 2021-04-21 DIAGNOSIS — S72002A Fracture of unspecified part of neck of left femur, initial encounter for closed fracture: Secondary | ICD-10-CM | POA: Diagnosis not present

## 2021-04-21 DIAGNOSIS — F3189 Other bipolar disorder: Secondary | ICD-10-CM | POA: Diagnosis not present

## 2021-04-21 DIAGNOSIS — S72002D Fracture of unspecified part of neck of left femur, subsequent encounter for closed fracture with routine healing: Secondary | ICD-10-CM | POA: Diagnosis not present

## 2021-04-21 DIAGNOSIS — M1611 Unilateral primary osteoarthritis, right hip: Secondary | ICD-10-CM | POA: Diagnosis not present

## 2021-04-21 DIAGNOSIS — M461 Sacroiliitis, not elsewhere classified: Secondary | ICD-10-CM | POA: Diagnosis not present

## 2021-04-21 DIAGNOSIS — F0390 Unspecified dementia without behavioral disturbance: Secondary | ICD-10-CM | POA: Diagnosis not present

## 2021-04-21 DIAGNOSIS — F0280 Dementia in other diseases classified elsewhere without behavioral disturbance: Secondary | ICD-10-CM | POA: Diagnosis not present

## 2021-04-21 DIAGNOSIS — D62 Acute posthemorrhagic anemia: Secondary | ICD-10-CM | POA: Diagnosis not present

## 2021-04-21 DIAGNOSIS — F319 Bipolar disorder, unspecified: Secondary | ICD-10-CM | POA: Diagnosis not present

## 2021-04-21 DIAGNOSIS — R41841 Cognitive communication deficit: Secondary | ICD-10-CM | POA: Diagnosis not present

## 2021-04-21 DIAGNOSIS — M8588 Other specified disorders of bone density and structure, other site: Secondary | ICD-10-CM | POA: Diagnosis not present

## 2021-04-21 DIAGNOSIS — E876 Hypokalemia: Secondary | ICD-10-CM | POA: Diagnosis not present

## 2021-04-21 DIAGNOSIS — R52 Pain, unspecified: Secondary | ICD-10-CM | POA: Diagnosis not present

## 2021-04-21 DIAGNOSIS — K219 Gastro-esophageal reflux disease without esophagitis: Secondary | ICD-10-CM | POA: Diagnosis not present

## 2021-04-24 DIAGNOSIS — F0280 Dementia in other diseases classified elsewhere without behavioral disturbance: Secondary | ICD-10-CM | POA: Diagnosis not present

## 2021-04-24 DIAGNOSIS — F3189 Other bipolar disorder: Secondary | ICD-10-CM | POA: Diagnosis not present

## 2021-04-24 DIAGNOSIS — R52 Pain, unspecified: Secondary | ICD-10-CM | POA: Diagnosis not present

## 2021-04-24 DIAGNOSIS — I10 Essential (primary) hypertension: Secondary | ICD-10-CM | POA: Diagnosis not present

## 2021-04-24 DIAGNOSIS — E876 Hypokalemia: Secondary | ICD-10-CM | POA: Diagnosis not present

## 2021-04-24 DIAGNOSIS — D62 Acute posthemorrhagic anemia: Secondary | ICD-10-CM | POA: Diagnosis not present

## 2021-04-24 DIAGNOSIS — S72002A Fracture of unspecified part of neck of left femur, initial encounter for closed fracture: Secondary | ICD-10-CM | POA: Diagnosis not present

## 2021-04-24 DIAGNOSIS — K219 Gastro-esophageal reflux disease without esophagitis: Secondary | ICD-10-CM | POA: Diagnosis not present

## 2021-05-02 DIAGNOSIS — S72002D Fracture of unspecified part of neck of left femur, subsequent encounter for closed fracture with routine healing: Secondary | ICD-10-CM | POA: Diagnosis not present

## 2021-05-30 DIAGNOSIS — M8588 Other specified disorders of bone density and structure, other site: Secondary | ICD-10-CM | POA: Diagnosis not present

## 2021-05-30 DIAGNOSIS — M461 Sacroiliitis, not elsewhere classified: Secondary | ICD-10-CM | POA: Diagnosis not present

## 2021-05-30 DIAGNOSIS — M1611 Unilateral primary osteoarthritis, right hip: Secondary | ICD-10-CM | POA: Diagnosis not present

## 2021-05-30 DIAGNOSIS — S72002D Fracture of unspecified part of neck of left femur, subsequent encounter for closed fracture with routine healing: Secondary | ICD-10-CM | POA: Diagnosis not present

## 2021-06-01 DIAGNOSIS — S72002D Fracture of unspecified part of neck of left femur, subsequent encounter for closed fracture with routine healing: Secondary | ICD-10-CM | POA: Diagnosis not present

## 2021-06-01 DIAGNOSIS — F0390 Unspecified dementia without behavioral disturbance: Secondary | ICD-10-CM | POA: Diagnosis not present

## 2021-06-01 DIAGNOSIS — I10 Essential (primary) hypertension: Secondary | ICD-10-CM | POA: Diagnosis not present

## 2021-06-06 ENCOUNTER — Encounter: Payer: Self-pay | Admitting: Internal Medicine

## 2021-06-06 DIAGNOSIS — F0280 Dementia in other diseases classified elsewhere without behavioral disturbance: Secondary | ICD-10-CM | POA: Diagnosis not present

## 2021-06-06 DIAGNOSIS — S72002A Fracture of unspecified part of neck of left femur, initial encounter for closed fracture: Secondary | ICD-10-CM | POA: Diagnosis not present

## 2021-06-06 DIAGNOSIS — F3189 Other bipolar disorder: Secondary | ICD-10-CM | POA: Diagnosis not present

## 2021-06-06 DIAGNOSIS — I10 Essential (primary) hypertension: Secondary | ICD-10-CM | POA: Diagnosis not present

## 2021-06-08 ENCOUNTER — Telehealth: Payer: Self-pay | Admitting: Internal Medicine

## 2021-06-08 DIAGNOSIS — F0390 Unspecified dementia without behavioral disturbance: Secondary | ICD-10-CM | POA: Diagnosis not present

## 2021-06-08 DIAGNOSIS — Z86010 Personal history of colonic polyps: Secondary | ICD-10-CM | POA: Diagnosis not present

## 2021-06-08 DIAGNOSIS — S72002D Fracture of unspecified part of neck of left femur, subsequent encounter for closed fracture with routine healing: Secondary | ICD-10-CM | POA: Diagnosis not present

## 2021-06-08 DIAGNOSIS — I10 Essential (primary) hypertension: Secondary | ICD-10-CM | POA: Diagnosis not present

## 2021-06-08 DIAGNOSIS — F32A Depression, unspecified: Secondary | ICD-10-CM | POA: Diagnosis not present

## 2021-06-08 DIAGNOSIS — K219 Gastro-esophageal reflux disease without esophagitis: Secondary | ICD-10-CM | POA: Diagnosis not present

## 2021-06-08 DIAGNOSIS — D6489 Other specified anemias: Secondary | ICD-10-CM | POA: Diagnosis not present

## 2021-06-08 DIAGNOSIS — E876 Hypokalemia: Secondary | ICD-10-CM | POA: Diagnosis not present

## 2021-06-08 DIAGNOSIS — M1990 Unspecified osteoarthritis, unspecified site: Secondary | ICD-10-CM | POA: Diagnosis not present

## 2021-06-08 DIAGNOSIS — D696 Thrombocytopenia, unspecified: Secondary | ICD-10-CM | POA: Diagnosis not present

## 2021-06-08 DIAGNOSIS — G4700 Insomnia, unspecified: Secondary | ICD-10-CM | POA: Diagnosis not present

## 2021-06-08 DIAGNOSIS — J45909 Unspecified asthma, uncomplicated: Secondary | ICD-10-CM | POA: Diagnosis not present

## 2021-06-08 DIAGNOSIS — W102XXD Fall (on)(from) incline, subsequent encounter: Secondary | ICD-10-CM | POA: Diagnosis not present

## 2021-06-08 DIAGNOSIS — E559 Vitamin D deficiency, unspecified: Secondary | ICD-10-CM | POA: Diagnosis not present

## 2021-06-08 DIAGNOSIS — F319 Bipolar disorder, unspecified: Secondary | ICD-10-CM | POA: Diagnosis not present

## 2021-06-08 DIAGNOSIS — K5900 Constipation, unspecified: Secondary | ICD-10-CM | POA: Diagnosis not present

## 2021-06-08 DIAGNOSIS — Z9181 History of falling: Secondary | ICD-10-CM | POA: Diagnosis not present

## 2021-06-08 NOTE — Telephone Encounter (Signed)
Ok for verbals 

## 2021-06-08 NOTE — Telephone Encounter (Signed)
[place] Name: [facility] Agency Name: Fonnie Mu Phone #: [phone] LVM Service Requested: PT (examples: OT/PT/Skilled Nursing/Social Work/Speech Therapy/Wound Care) Frequency of Visits: 1w5, 1 every 2 weeks for 4 weeks

## 2021-06-09 NOTE — Telephone Encounter (Signed)
Verbals given  

## 2021-06-12 DIAGNOSIS — M1990 Unspecified osteoarthritis, unspecified site: Secondary | ICD-10-CM | POA: Diagnosis not present

## 2021-06-12 DIAGNOSIS — I10 Essential (primary) hypertension: Secondary | ICD-10-CM | POA: Diagnosis not present

## 2021-06-12 DIAGNOSIS — J45909 Unspecified asthma, uncomplicated: Secondary | ICD-10-CM | POA: Diagnosis not present

## 2021-06-12 DIAGNOSIS — F0390 Unspecified dementia without behavioral disturbance: Secondary | ICD-10-CM | POA: Diagnosis not present

## 2021-06-12 DIAGNOSIS — S72002D Fracture of unspecified part of neck of left femur, subsequent encounter for closed fracture with routine healing: Secondary | ICD-10-CM | POA: Diagnosis not present

## 2021-06-12 DIAGNOSIS — F319 Bipolar disorder, unspecified: Secondary | ICD-10-CM | POA: Diagnosis not present

## 2021-06-16 ENCOUNTER — Other Ambulatory Visit: Payer: Self-pay

## 2021-06-16 ENCOUNTER — Encounter: Payer: Self-pay | Admitting: Internal Medicine

## 2021-06-16 ENCOUNTER — Ambulatory Visit (INDEPENDENT_AMBULATORY_CARE_PROVIDER_SITE_OTHER): Payer: Medicare Other | Admitting: Internal Medicine

## 2021-06-16 VITALS — BP 104/72 | HR 60 | Temp 98.7°F | Ht <= 58 in | Wt 101.4 lb

## 2021-06-16 DIAGNOSIS — M25552 Pain in left hip: Secondary | ICD-10-CM

## 2021-06-16 DIAGNOSIS — J4520 Mild intermittent asthma, uncomplicated: Secondary | ICD-10-CM | POA: Diagnosis not present

## 2021-06-16 DIAGNOSIS — I10 Essential (primary) hypertension: Secondary | ICD-10-CM | POA: Diagnosis not present

## 2021-06-16 DIAGNOSIS — E785 Hyperlipidemia, unspecified: Secondary | ICD-10-CM

## 2021-06-16 DIAGNOSIS — E559 Vitamin D deficiency, unspecified: Secondary | ICD-10-CM

## 2021-06-16 DIAGNOSIS — S72002A Fracture of unspecified part of neck of left femur, initial encounter for closed fracture: Secondary | ICD-10-CM

## 2021-06-16 NOTE — Patient Instructions (Signed)
Please continue all other medications as before, and refills have been done if requested.  Please have the pharmacy call with any other refills you may need.  Please continue your efforts at being more active, low cholesterol diet, and weight control.  Please keep your appointments with your specialists as you may have planned, including the PT at home  Please make an Appointment to return in 4 months, or sooner if needed

## 2021-06-16 NOTE — Progress Notes (Signed)
Patient ID: Madeline Mcdaniel, female   DOB: [birth year], 80 y.o.   MRN: [id number]        Chief Complaint: follow up post rehab s/p left hip surgury, htn, hld, asthma, vit d def       HPI:  Madeline Mcdaniel is a 80 y.o. female here overall doing well, ambulating today with mild pain persistent post surgury and rehab, getting PT at home weekly then husband helps the other days; has walker at home, no recent falls or other injury. Still needs assist with dressing, bathing, grooming and toileting, and worst pain is mostly getting in and out of bed.  Pt denies chest pain, increased sob or doe, wheezing, orthopnea, PND, increased LE swelling, palpitations, dizziness or syncope.   Pt denies polydipsia, polyuria, or new focal neuro s/s.   Pt denies fever, wt loss, night sweats, loss of appetite, or other constitutional symptoms  No other new complaints.  Dementia overall stable symptomatically, and not assoc with behavioral changes such as hallucinations, paranoia       Wt Readings from Last 3 Encounters:  06/16/21 101 lb 6.4 oz (46 kg)  03/22/21 94 lb 6.4 oz (42.8 kg)  03/06/21 95 lb 3.2 oz (43.2 kg)   BP Readings from Last 3 Encounters:  06/16/21 104/72  03/22/21 102/70  03/06/21 120/74         Past Medical History:  Diagnosis Date   Allergic rhinitis, cause unspecified 04/27/2011   Anemia 04/27/2011   Asthma 04/27/2011   Bipolar affective disorder ([place]) 04/27/2011   Cervical spondylosis 06/01/2012   Chronic bronchitis 04/27/2011   Chronic cystitis 04/27/2011   Colon polyps 04/27/2011   Degenerative joint disease 04/27/2011   GERD (gastroesophageal reflux disease) 04/27/2011   HTN (hypertension) 04/27/2011   Hyperlipidemia 04/27/2011   Mild cognitive impairment 04/12/2013   Osteopenia 06/01/2012   Recurrent UTI 04/27/2011   Right carpal tunnel syndrome 07/22/2012   TIA (transient ischemic attack) 04/27/2011   Urinary incontinence 04/27/2011   Past Surgical History:  Procedure Laterality Date   BLADDER  SUSPENSION     A&P REPAIR WITH MESH   BREAST BIOPSY     BREAST LUMPECTOMY     SEVERAL TIMES   BUNIONECTOMY     LEFT FOOT   CHOLECYSTECTOMY     COLONOSCOPY  39YRS AGO   IN [place],PT DOES NOT REMEMBER DOCTOR   [facility] Bunionectomy Left 09/09/2014   @ [facility] Left 09/09/2014   @ [facility]   POLYPECTOMY  10 YRS AGO   BENIGN POLYPS X2   STERIOTACTIC BREAST BIOPSY     RIGHT   TENDON TRANSFER  12/09/2012   Procedure: TENDON TRANSFER;  Surgeon: Wynonia Sours, MD;  Location: [facility];  Service: Orthopedics;  Laterality: Left;  [place] PIP with FDS Slip Left ring finger, [place]   TONSILLECTOMY     TOTAL ABDOMINAL HYSTERECTOMY     TRIGGER FINGER RELEASE     LEFT RING FINGER    reports that she has never smoked. She has never used smokeless tobacco. She reports that she does not drink alcohol and does not use drugs. family history includes Cancer (age of onset: 17) in her mother; Colon cancer in her paternal grandfather. Allergies  Allergen Reactions   Aspirin Anaphylaxis   Nsaids Anaphylaxis    BECAUSE OF REACTION TO VIOXX BECAUSE OF REACTION TO VIOXX   Other Anaphylaxis  BECAUSE OF REACTION TO VIOXX BECAUSE OF REACTION TO VIOXX BECAUSE OF REACTION TO VIOXX   Vioxx [Rofecoxib] Anaphylaxis   Ciprofloxacin Other (See Comments)    HEADACHE HEADACHE   Current Outpatient Medications on File Prior to Visit  Medication Sig Dispense Refill   acetaminophen (TYLENOL) 500 MG tablet Take 500 mg by mouth as needed.     acetaminophen (TYLENOL) 500 MG tablet Take by mouth.     bisacodyl (DULCOLAX) 5 MG EC tablet Take by mouth.     CALCIUM PO Take by mouth as directed. Reported on 01/30/2016     Cholecalciferol (THERA-D 2000) 50 MCG (2000 UT) TABS 1 tab by mouth once daily 90 tablet 99   Coenzyme Q10 (CO Q 10 PO) Take 200 mg by mouth daily.     Cranberry 500 MG CAPS Take by mouth 2 (two) times daily.      Fluticasone-Salmeterol (ADVAIR DISKUS IN) Inhale 1 Inhaler into the lungs as needed. 100/50     gabapentin (NEURONTIN) 300 MG capsule TAKE 1 CAPSULE BY MOUTH EVERY MORNING AND TAKE 2 CAPSULES BY MOUTH EVERY EVENING 90 capsule 5   hydrochlorothiazide (HYDRODIURIL) 12.5 MG tablet Take 1 tablet (12.5 mg total) by mouth daily. 90 tablet 3   melatonin 3 MG TABS tablet Take by mouth.     memantine (NAMENDA) 10 MG tablet Take 1 tablet twice a day 180 tablet 3   nitrofurantoin (MACRODANTIN) 50 MG capsule 1 by mouth twice per day as needed 90 capsule 1   potassium chloride (MICRO-K) 10 MEQ CR capsule Take 10 mEq by mouth daily.     rivastigmine (EXELON) 1.5 MG capsule TAKE ONE CAPSULE BY MOUTH TWICE A DAY 180 capsule 3   rivastigmine (EXELON) 3 MG capsule Take 1 capsule (3 mg total) by mouth 2 (two) times daily. 60 capsule 11   solifenacin (VESICARE) 5 MG tablet TAKE 1 TABLET(5 MG) BY MOUTH DAILY 90 tablet 3   oxyCODONE-acetaminophen (PERCOCET/ROXICET) 5-325 MG tablet Take by mouth.     potassium chloride (KLOR-CON 10) 10 MEQ tablet Take 1 tablet (10 mEq total) by mouth daily for 5 days. 90 tablet 3   No current facility-administered medications on file prior to visit.        ROS:  All others reviewed and negative.  Objective        PE:  BP 104/72 (BP Location: Right Arm, Patient Position: Sitting, Cuff Size: Normal)   Pulse 60   Temp 98.7 F (37.1 C) (Oral)   Ht 4\' 9"  (1.448 m)   Wt 101 lb 6.4 oz (46 kg)   SpO2 95%   BMI 21.94 kg/m                 Constitutional: Pt appears in NAD               HENT: Head: NCAT.                Right Ear: External ear normal.                 Left Ear: External ear normal.                Eyes: . Pupils are equal, round, and reactive to light. Conjunctivae and EOM are normal               Nose: without d/c or deformity               Neck: Neck supple. Johney Maine  normal ROM               Cardiovascular: Normal rate and regular rhythm.                  Pulmonary/Chest: Effort normal and breath sounds without rales or wheezing.                Abd:  Soft, NT, ND, + BS, no organomegaly               Neurological: Pt is alert. At baseline orientation, motor grossly intact               Skin: Skin is warm. No rashes, no other new lesions, LE edema - none               Psychiatric: Pt behavior is normal without agitation   Micro: none  Cardiac tracings I have personally interpreted today:  none  Pertinent Radiological findings (summarize): none   Lab Results  Component Value Date   WBC 6.9 01/18/2021   HGB 13.3 01/18/2021   HCT 39.2 01/18/2021   PLT 135.0 (L) 01/18/2021   GLUCOSE 102 (H) 01/18/2021   CHOL 181 01/18/2021   TRIG 128.0 01/18/2021   HDL 58.80 01/18/2021   LDLDIRECT 113.9 04/08/2013   LDLCALC 97 01/18/2021   ALT 7 01/18/2021   AST 12 01/18/2021   NA 143 01/18/2021   K 3.2 (L) 01/18/2021   CL 108 01/18/2021   CREATININE 0.81 01/18/2021   BUN 14 01/18/2021   CO2 29 01/18/2021   TSH 0.64 01/18/2021   HGBA1C 5.7 01/18/2021   Assessment/Plan:  Madeline Mcdaniel is a 80 y.o. White or Caucasian [1] female with  has a past medical history of Allergic rhinitis, cause unspecified (04/27/2011), Anemia (04/27/2011), Asthma (04/27/2011), Bipolar affective disorder ([place]) (04/27/2011), Cervical spondylosis (06/01/2012), Chronic bronchitis (04/27/2011), Chronic cystitis (04/27/2011), Colon polyps (04/27/2011), Degenerative joint disease (04/27/2011), GERD (gastroesophageal reflux disease) (04/27/2011), HTN (hypertension) (04/27/2011), Hyperlipidemia (04/27/2011), Mild cognitive impairment (04/12/2013), Osteopenia (06/01/2012), Recurrent UTI (04/27/2011), Right carpal tunnel syndrome (07/22/2012), TIA (transient ischemic attack) (04/27/2011), and Urinary incontinence (04/27/2011).  Vitamin D deficiency Last vitamin D Lab Results  Component Value Date   VD25OH 40.53 01/18/2021   Stable, cont oral replacement   HTN (hypertension) BP Readings from Last 3  Encounters:  06/16/21 104/72  03/22/21 102/70  03/06/21 120/74   Stable, pt to continue medical treatment hct   Hyperlipidemia Lab Results  Component Value Date   LDLCALC 97 01/18/2021   Stable, pt to continue current low chol diet   Mild intermittent asthma Stable, no wheezing, cont current med tx - advair  Left hip pain Mild persistent pain s/p surgury, for contd PT and home excercises, and home assist per husband, ow stable functionally and progressinga as expected  Followup: Return in about 4 months (around 10/17/2021).  Cathlean Cower, MD 06/17/2021 11:51 PM [facility]

## 2021-06-17 ENCOUNTER — Encounter: Payer: Self-pay | Admitting: Internal Medicine

## 2021-06-17 NOTE — Assessment & Plan Note (Signed)
Stable, no wheezing, cont current med tx - advair

## 2021-06-17 NOTE — Assessment & Plan Note (Signed)
Lab Results  Component Value Date   LDLCALC 97 01/18/2021   Stable, pt to continue current low chol diet

## 2021-06-17 NOTE — Assessment & Plan Note (Signed)
Last vitamin D Lab Results  Component Value Date   VD25OH 40.53 01/18/2021   Stable, cont oral replacement

## 2021-06-17 NOTE — Assessment & Plan Note (Signed)
BP Readings from Last 3 Encounters:  06/16/21 104/72  03/22/21 102/70  03/06/21 120/74   Stable, pt to continue medical treatment hct

## 2021-06-17 NOTE — Assessment & Plan Note (Signed)
Mild persistent pain s/p surgury, for contd PT and home excercises, and home assist per husband, ow stable functionally and progressinga as expected

## 2021-06-21 DIAGNOSIS — F0390 Unspecified dementia without behavioral disturbance: Secondary | ICD-10-CM | POA: Diagnosis not present

## 2021-06-21 DIAGNOSIS — I10 Essential (primary) hypertension: Secondary | ICD-10-CM | POA: Diagnosis not present

## 2021-06-21 DIAGNOSIS — F319 Bipolar disorder, unspecified: Secondary | ICD-10-CM | POA: Diagnosis not present

## 2021-06-21 DIAGNOSIS — J45909 Unspecified asthma, uncomplicated: Secondary | ICD-10-CM | POA: Diagnosis not present

## 2021-06-21 DIAGNOSIS — M1990 Unspecified osteoarthritis, unspecified site: Secondary | ICD-10-CM | POA: Diagnosis not present

## 2021-06-21 DIAGNOSIS — S72002D Fracture of unspecified part of neck of left femur, subsequent encounter for closed fracture with routine healing: Secondary | ICD-10-CM | POA: Diagnosis not present

## 2021-06-22 DIAGNOSIS — S72002D Fracture of unspecified part of neck of left femur, subsequent encounter for closed fracture with routine healing: Secondary | ICD-10-CM | POA: Diagnosis not present

## 2021-06-22 DIAGNOSIS — I10 Essential (primary) hypertension: Secondary | ICD-10-CM | POA: Diagnosis not present

## 2021-06-22 DIAGNOSIS — M1990 Unspecified osteoarthritis, unspecified site: Secondary | ICD-10-CM | POA: Diagnosis not present

## 2021-06-22 DIAGNOSIS — J45909 Unspecified asthma, uncomplicated: Secondary | ICD-10-CM | POA: Diagnosis not present

## 2021-06-22 DIAGNOSIS — F319 Bipolar disorder, unspecified: Secondary | ICD-10-CM | POA: Diagnosis not present

## 2021-06-22 DIAGNOSIS — F0390 Unspecified dementia without behavioral disturbance: Secondary | ICD-10-CM | POA: Diagnosis not present

## 2021-06-26 DIAGNOSIS — S72002D Fracture of unspecified part of neck of left femur, subsequent encounter for closed fracture with routine healing: Secondary | ICD-10-CM | POA: Diagnosis not present

## 2021-06-26 DIAGNOSIS — M1990 Unspecified osteoarthritis, unspecified site: Secondary | ICD-10-CM | POA: Diagnosis not present

## 2021-06-26 DIAGNOSIS — F319 Bipolar disorder, unspecified: Secondary | ICD-10-CM | POA: Diagnosis not present

## 2021-06-26 DIAGNOSIS — I10 Essential (primary) hypertension: Secondary | ICD-10-CM | POA: Diagnosis not present

## 2021-06-26 DIAGNOSIS — J45909 Unspecified asthma, uncomplicated: Secondary | ICD-10-CM | POA: Diagnosis not present

## 2021-06-26 DIAGNOSIS — F0390 Unspecified dementia without behavioral disturbance: Secondary | ICD-10-CM | POA: Diagnosis not present

## 2021-06-30 DIAGNOSIS — I10 Essential (primary) hypertension: Secondary | ICD-10-CM | POA: Diagnosis not present

## 2021-06-30 DIAGNOSIS — F319 Bipolar disorder, unspecified: Secondary | ICD-10-CM | POA: Diagnosis not present

## 2021-06-30 DIAGNOSIS — S72002D Fracture of unspecified part of neck of left femur, subsequent encounter for closed fracture with routine healing: Secondary | ICD-10-CM | POA: Diagnosis not present

## 2021-06-30 DIAGNOSIS — J45909 Unspecified asthma, uncomplicated: Secondary | ICD-10-CM | POA: Diagnosis not present

## 2021-06-30 DIAGNOSIS — M1990 Unspecified osteoarthritis, unspecified site: Secondary | ICD-10-CM | POA: Diagnosis not present

## 2021-06-30 DIAGNOSIS — F0390 Unspecified dementia without behavioral disturbance: Secondary | ICD-10-CM | POA: Diagnosis not present

## 2021-07-03 DIAGNOSIS — M1990 Unspecified osteoarthritis, unspecified site: Secondary | ICD-10-CM | POA: Diagnosis not present

## 2021-07-03 DIAGNOSIS — J45909 Unspecified asthma, uncomplicated: Secondary | ICD-10-CM | POA: Diagnosis not present

## 2021-07-03 DIAGNOSIS — S72002D Fracture of unspecified part of neck of left femur, subsequent encounter for closed fracture with routine healing: Secondary | ICD-10-CM | POA: Diagnosis not present

## 2021-07-03 DIAGNOSIS — F319 Bipolar disorder, unspecified: Secondary | ICD-10-CM | POA: Diagnosis not present

## 2021-07-03 DIAGNOSIS — I10 Essential (primary) hypertension: Secondary | ICD-10-CM | POA: Diagnosis not present

## 2021-07-03 DIAGNOSIS — F0390 Unspecified dementia without behavioral disturbance: Secondary | ICD-10-CM | POA: Diagnosis not present

## 2021-07-05 DIAGNOSIS — S72002D Fracture of unspecified part of neck of left femur, subsequent encounter for closed fracture with routine healing: Secondary | ICD-10-CM | POA: Diagnosis not present

## 2021-07-05 DIAGNOSIS — F319 Bipolar disorder, unspecified: Secondary | ICD-10-CM | POA: Diagnosis not present

## 2021-07-05 DIAGNOSIS — F0390 Unspecified dementia without behavioral disturbance: Secondary | ICD-10-CM | POA: Diagnosis not present

## 2021-07-05 DIAGNOSIS — J45909 Unspecified asthma, uncomplicated: Secondary | ICD-10-CM | POA: Diagnosis not present

## 2021-07-05 DIAGNOSIS — I10 Essential (primary) hypertension: Secondary | ICD-10-CM | POA: Diagnosis not present

## 2021-07-05 DIAGNOSIS — M1990 Unspecified osteoarthritis, unspecified site: Secondary | ICD-10-CM | POA: Diagnosis not present

## 2021-07-08 DIAGNOSIS — G4700 Insomnia, unspecified: Secondary | ICD-10-CM | POA: Diagnosis not present

## 2021-07-08 DIAGNOSIS — M1990 Unspecified osteoarthritis, unspecified site: Secondary | ICD-10-CM | POA: Diagnosis not present

## 2021-07-08 DIAGNOSIS — E876 Hypokalemia: Secondary | ICD-10-CM | POA: Diagnosis not present

## 2021-07-08 DIAGNOSIS — F319 Bipolar disorder, unspecified: Secondary | ICD-10-CM | POA: Diagnosis not present

## 2021-07-08 DIAGNOSIS — F0390 Unspecified dementia without behavioral disturbance: Secondary | ICD-10-CM | POA: Diagnosis not present

## 2021-07-08 DIAGNOSIS — E559 Vitamin D deficiency, unspecified: Secondary | ICD-10-CM | POA: Diagnosis not present

## 2021-07-08 DIAGNOSIS — I10 Essential (primary) hypertension: Secondary | ICD-10-CM | POA: Diagnosis not present

## 2021-07-08 DIAGNOSIS — K219 Gastro-esophageal reflux disease without esophagitis: Secondary | ICD-10-CM | POA: Diagnosis not present

## 2021-07-08 DIAGNOSIS — K5900 Constipation, unspecified: Secondary | ICD-10-CM | POA: Diagnosis not present

## 2021-07-08 DIAGNOSIS — D6489 Other specified anemias: Secondary | ICD-10-CM | POA: Diagnosis not present

## 2021-07-08 DIAGNOSIS — F32A Depression, unspecified: Secondary | ICD-10-CM | POA: Diagnosis not present

## 2021-07-08 DIAGNOSIS — S72002D Fracture of unspecified part of neck of left femur, subsequent encounter for closed fracture with routine healing: Secondary | ICD-10-CM | POA: Diagnosis not present

## 2021-07-08 DIAGNOSIS — W102XXD Fall (on)(from) incline, subsequent encounter: Secondary | ICD-10-CM | POA: Diagnosis not present

## 2021-07-08 DIAGNOSIS — J45909 Unspecified asthma, uncomplicated: Secondary | ICD-10-CM | POA: Diagnosis not present

## 2021-07-08 DIAGNOSIS — Z9181 History of falling: Secondary | ICD-10-CM | POA: Diagnosis not present

## 2021-07-08 DIAGNOSIS — D696 Thrombocytopenia, unspecified: Secondary | ICD-10-CM | POA: Diagnosis not present

## 2021-07-08 DIAGNOSIS — Z86010 Personal history of colonic polyps: Secondary | ICD-10-CM | POA: Diagnosis not present

## 2021-07-14 DIAGNOSIS — I10 Essential (primary) hypertension: Secondary | ICD-10-CM | POA: Diagnosis not present

## 2021-07-14 DIAGNOSIS — F319 Bipolar disorder, unspecified: Secondary | ICD-10-CM | POA: Diagnosis not present

## 2021-07-14 DIAGNOSIS — S72002D Fracture of unspecified part of neck of left femur, subsequent encounter for closed fracture with routine healing: Secondary | ICD-10-CM | POA: Diagnosis not present

## 2021-07-14 DIAGNOSIS — F0390 Unspecified dementia without behavioral disturbance: Secondary | ICD-10-CM | POA: Diagnosis not present

## 2021-07-14 DIAGNOSIS — J45909 Unspecified asthma, uncomplicated: Secondary | ICD-10-CM | POA: Diagnosis not present

## 2021-07-14 DIAGNOSIS — M1990 Unspecified osteoarthritis, unspecified site: Secondary | ICD-10-CM | POA: Diagnosis not present

## 2021-07-17 DIAGNOSIS — M1990 Unspecified osteoarthritis, unspecified site: Secondary | ICD-10-CM | POA: Diagnosis not present

## 2021-07-17 DIAGNOSIS — F319 Bipolar disorder, unspecified: Secondary | ICD-10-CM | POA: Diagnosis not present

## 2021-07-17 DIAGNOSIS — S72002D Fracture of unspecified part of neck of left femur, subsequent encounter for closed fracture with routine healing: Secondary | ICD-10-CM | POA: Diagnosis not present

## 2021-07-17 DIAGNOSIS — F0390 Unspecified dementia without behavioral disturbance: Secondary | ICD-10-CM | POA: Diagnosis not present

## 2021-07-17 DIAGNOSIS — I10 Essential (primary) hypertension: Secondary | ICD-10-CM | POA: Diagnosis not present

## 2021-07-17 DIAGNOSIS — J45909 Unspecified asthma, uncomplicated: Secondary | ICD-10-CM | POA: Diagnosis not present

## 2021-08-03 DIAGNOSIS — I10 Essential (primary) hypertension: Secondary | ICD-10-CM | POA: Diagnosis not present

## 2021-08-03 DIAGNOSIS — S72002D Fracture of unspecified part of neck of left femur, subsequent encounter for closed fracture with routine healing: Secondary | ICD-10-CM | POA: Diagnosis not present

## 2021-08-03 DIAGNOSIS — M1990 Unspecified osteoarthritis, unspecified site: Secondary | ICD-10-CM | POA: Diagnosis not present

## 2021-08-03 DIAGNOSIS — F319 Bipolar disorder, unspecified: Secondary | ICD-10-CM | POA: Diagnosis not present

## 2021-08-03 DIAGNOSIS — J45909 Unspecified asthma, uncomplicated: Secondary | ICD-10-CM | POA: Diagnosis not present

## 2021-08-03 DIAGNOSIS — F0390 Unspecified dementia without behavioral disturbance: Secondary | ICD-10-CM | POA: Diagnosis not present

## 2021-09-04 DIAGNOSIS — Z961 Presence of intraocular lens: Secondary | ICD-10-CM | POA: Diagnosis not present

## 2021-09-04 DIAGNOSIS — H26491 Other secondary cataract, right eye: Secondary | ICD-10-CM | POA: Diagnosis not present

## 2021-09-12 ENCOUNTER — Other Ambulatory Visit: Payer: Self-pay | Admitting: Internal Medicine

## 2021-09-12 DIAGNOSIS — Z1231 Encounter for screening mammogram for malignant neoplasm of breast: Secondary | ICD-10-CM

## 2021-09-18 ENCOUNTER — Encounter: Payer: Self-pay | Admitting: Neurology

## 2021-09-18 ENCOUNTER — Ambulatory Visit (INDEPENDENT_AMBULATORY_CARE_PROVIDER_SITE_OTHER): Payer: Medicare Other | Admitting: Neurology

## 2021-09-18 ENCOUNTER — Other Ambulatory Visit: Payer: Self-pay

## 2021-09-18 VITALS — BP 151/84 | HR 68 | Resp 18 | Ht <= 58 in | Wt 97.0 lb

## 2021-09-18 DIAGNOSIS — F0150 Vascular dementia without behavioral disturbance: Secondary | ICD-10-CM | POA: Diagnosis not present
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
## 2021-09-18 NOTE — Patient Instructions (Addendum)
Good to see you. Keeping your body healthy will keep brain healthy for as long as we can. Please start exercising regularly, walking 30 minutes 3 times a week minimum. Follow-up as needed, call for any changes   FALL PRECAUTIONS: Be cautious when walking. Scan the area for obstacles that may increase the risk of trips and falls. When getting up in the mornings, sit up at the edge of the bed for a few minutes before getting out of bed. Consider elevating the bed at the head end to avoid drop of blood pressure when getting up. Walk always in a well-lit room (use night lights in the walls). Avoid area rugs or power cords from appliances in the middle of the walkways. Use a walker or a cane if necessary and consider physical therapy for balance exercise. Get your eyesight checked regularly.  HOME SAFETY: Consider the safety of the kitchen when operating appliances like stoves, microwave oven, and blender. Consider having supervision and share cooking responsibilities until no longer able to participate in those. Accidents with firearms and other hazards in the house should be identified and addressed as well.  ABILITY TO BE LEFT ALONE: If patient is unable to contact 911 operator, consider using LifeLine, or when the need is there, arrange for someone to stay with patients. Smoking is a fire hazard, consider supervision or cessation. Risk of wandering should be assessed by caregiver and if detected at any point, supervision and safe proof recommendations should be instituted.    RECOMMENDATIONS FOR ALL PATIENTS WITH MEMORY PROBLEMS: 1. Continue to exercise (Recommend 30 minutes of walking everyday, or 3 hours every week) 2. Increase social interactions - continue going to [place] and enjoy social gatherings with friends and family 3. Eat healthy, avoid fried foods and eat more fruits and vegetables 4. Maintain adequate blood pressure, blood sugar, and blood cholesterol level. Reducing the risk of stroke  and cardiovascular disease also helps promoting better memory. 5. Avoid stressful situations. Live a simple life and avoid aggravations. Organize your time and prepare for the next day in anticipation. 6. Sleep well, avoid any interruptions of sleep and avoid any distractions in the bedroom that may interfere with adequate sleep quality 7. Avoid sugar, avoid sweets as there is a strong link between excessive sugar intake, diabetes, and cognitive impairment We discussed the Mediterranean diet, which has been shown to help patients reduce the risk of progressive memory disorders and reduces cardiovascular risk. This includes eating fish, eat fruits and green leafy vegetables, nuts like almonds and hazelnuts, walnuts, and also use olive oil. Avoid fast foods and fried foods as much as possible. Avoid sweets and sugar as sugar use has been linked to worsening of memory function.  There is always a concern of gradual progression of memory problems. If this is the case, then we may need to adjust level of care according to patient needs. Support, both to the patient and caregiver, should then be put into place.       Mediterranean Diet  Why follow it? Research shows. Those who follow the Mediterranean diet have a reduced risk of heart disease  The diet is associated with a reduced incidence of Parkinson's and Alzheimer's diseases People following the diet may have longer life expectancies and lower rates of chronic diseases  The Dietary Guidelines for Americans recommends the Mediterranean diet as an eating plan to promote health and prevent disease  What Is the Mediterranean Diet?  Healthy eating plan based on typical foods and  recipes of Mediterranean-style cooking The diet is primarily a plant based diet; these foods should make up a majority of meals   Starches - Plant based foods should make up a majority of meals - They are an important sources of vitamins, minerals, energy, antioxidants, and  fiber - Choose whole grains, foods high in fiber and minimally processed items  - Typical grain sources include wheat, oats, barley, corn, brown rice, bulgar, farro, millet, polenta, couscous  - Various types of beans include chickpeas, lentils, fava beans, black beans, white beans   Fruits  Veggies - Large quantities of antioxidant rich fruits & veggies; 6 or more servings  - Vegetables can be eaten raw or lightly drizzled with oil and cooked  - Vegetables common to the traditional Mediterranean Diet include: artichokes, arugula, beets, broccoli, brussel sprouts, cabbage, carrots, celery, collard greens, cucumbers, eggplant, kale, leeks, lemons, lettuce, mushrooms, okra, onions, peas, peppers, potatoes, pumpkin, radishes, rutabaga, shallots, spinach, sweet potatoes, turnips, zucchini - Fruits common to the Mediterranean Diet include: apples, apricots, avocados, cherries, clementines, dates, figs, grapefruits, grapes, melons, nectarines, oranges, peaches, pears, pomegranates, strawberries, tangerines  Fats - Replace butter and margarine with healthy oils, such as olive oil, canola oil, and tahini  - Limit nuts to no more than a handful a day  - Nuts include walnuts, almonds, pecans, pistachios, pine nuts  - Limit or avoid candied, honey roasted or heavily salted nuts - Olives are central to the [employer] - can be eaten whole or used in a variety of dishes   Meats Protein - Limiting red meat: no more than a few times a month - When eating red meat: choose lean cuts and keep the portion to the size of deck of cards - Eggs: approx. 0 to 4 times a week  - Fish and lean poultry: at least 2 a week  - Healthy protein sources include, chicken, [place], lean beef, lamb - Increase intake of seafood such as tuna, salmon, trout, mackerel, shrimp, scallops - Avoid or limit high fat processed meats such as sausage and bacon  Dairy - Include moderate amounts of low fat dairy products  - Focus on  healthy dairy such as fat free yogurt, skim milk, low or reduced fat cheese - Limit dairy products higher in fat such as whole or 2% milk, cheese, ice cream  Alcohol - Moderate amounts of red wine is ok  - No more than 5 oz daily for women (all ages) and men older than age 50  - No more than 10 oz of wine daily for men younger than 55  Other - Limit sweets and other desserts  - Use herbs and spices instead of salt to flavor foods  - Herbs and spices common to the traditional Mediterranean Diet include: basil, bay leaves, chives, cloves, cumin, fennel, garlic, lavender, marjoram, mint, oregano, parsley, pepper, rosemary, sage, savory, sumac, tarragon, thyme   It's not just a diet, it's a lifestyle:  The Mediterranean diet includes lifestyle factors typical of those in the region  Foods, drinks and meals are best eaten with others and savored Daily physical activity is important for overall good health This could be strenuous exercise like running and aerobics This could also be more leisurely activities such as walking, housework, yard-work, or taking the stairs Moderation is the key; a balanced and healthy diet accommodates most foods and drinks Consider portion sizes and frequency of consumption of certain foods   Meal Ideas & Options:  Breakfast:  Whole  wheat toast or whole wheat English muffins with peanut butter & hard boiled egg Steel cut oats topped with apples & cinnamon and skim milk  Fresh fruit: banana, strawberries, melon, berries, peaches  Smoothies: strawberries, bananas, greek yogurt, peanut butter Low fat greek yogurt with blueberries and granola  Egg white omelet with spinach and mushrooms Breakfast couscous: whole wheat couscous, apricots, skim milk, cranberries  Sandwiches:  Hummus and grilled vegetables (peppers, zucchini, squash) on whole wheat bread   Grilled chicken on whole wheat pita with lettuce, tomatoes, cucumbers or tzatziki  [place] salad on whole wheat bread:  tuna salad made with greek yogurt, olives, red peppers, capers, green onions Garlic rosemary lamb pita: lamb sauted with garlic, rosemary, salt & pepper; add lettuce, cucumber, greek yogurt to pita - flavor with lemon juice and black pepper  Seafood:  Mediterranean grilled salmon, seasoned with garlic, basil, parsley, lemon juice and black pepper Shrimp, lemon, and spinach whole-grain pasta salad made with low fat greek yogurt  Seared scallops with lemon orzo  Seared tuna steaks seasoned salt, pepper, coriander topped with tomato mixture of olives, tomatoes, olive oil, minced garlic, parsley, green onions and cappers  Meats:  Herbed greek chicken salad with kalamata olives, cucumber, feta  Red bell peppers stuffed with spinach, bulgur, lean ground beef (or lentils) & topped with feta   Kebabs: skewers of chicken, tomatoes, onions, zucchini, squash  [place] burgers: made with red onions, mint, dill, lemon juice, feta cheese topped with roasted red peppers Vegetarian Cucumber salad: cucumbers, artichoke hearts, celery, red onion, feta cheese, tossed in olive oil & lemon juice  Hummus and whole grain pita points with a greek salad (lettuce, tomato, feta, olives, cucumbers, red onion) Lentil soup with celery, carrots made with vegetable broth, garlic, salt and pepper  Tabouli salad: parsley, bulgur, mint, scallions, cucumbers, tomato, radishes, lemon juice, olive oil, salt and pepper.

## 2021-09-25 DIAGNOSIS — Y999 Unspecified external cause status: Secondary | ICD-10-CM | POA: Diagnosis not present

## 2021-09-25 DIAGNOSIS — Z23 Encounter for immunization: Secondary | ICD-10-CM | POA: Diagnosis not present

## 2021-09-25 DIAGNOSIS — R058 Other specified cough: Secondary | ICD-10-CM | POA: Diagnosis not present

## 2021-09-25 DIAGNOSIS — F0390 Unspecified dementia without behavioral disturbance: Secondary | ICD-10-CM | POA: Diagnosis not present

## 2021-09-25 DIAGNOSIS — T551X1A Toxic effect of detergents, accidental (unintentional), initial encounter: Secondary | ICD-10-CM | POA: Diagnosis not present

## 2021-09-25 DIAGNOSIS — T189XXA Foreign body of alimentary tract, part unspecified, initial encounter: Secondary | ICD-10-CM | POA: Diagnosis not present

## 2021-09-25 DIAGNOSIS — X58XXXA Exposure to other specified factors, initial encounter: Secondary | ICD-10-CM | POA: Diagnosis not present

## 2021-10-13 ENCOUNTER — Other Ambulatory Visit: Payer: Self-pay

## 2021-10-13 ENCOUNTER — Ambulatory Visit
Admission: RE | Admit: 2021-10-13 | Discharge: 2021-10-13 | Disposition: A | Discharge status: Home / Self Care | Payer: Medicare Other | Source: Ambulatory Visit | Attending: Internal Medicine | Admitting: Internal Medicine

## 2021-10-13 DIAGNOSIS — Z1231 Encounter for screening mammogram for malignant neoplasm of breast: Secondary | ICD-10-CM

## 2021-10-18 ENCOUNTER — Telehealth: Payer: Self-pay | Admitting: Internal Medicine

## 2021-10-18 NOTE — Telephone Encounter (Signed)
Patient's son Madeline Mcdaniel requesting to fax provider a list of symptoms he has observed patient exhibiting   Gerald Stabs is not listed on dpr, no other information was given besides fax number

## 2021-10-20 ENCOUNTER — Encounter: Payer: Self-pay | Admitting: Internal Medicine

## 2021-10-20 ENCOUNTER — Ambulatory Visit (INDEPENDENT_AMBULATORY_CARE_PROVIDER_SITE_OTHER): Payer: Medicare Other | Admitting: Internal Medicine

## 2021-10-20 ENCOUNTER — Other Ambulatory Visit: Payer: Self-pay

## 2021-10-20 VITALS — BP 114/70 | HR 43 | Ht <= 58 in | Wt 95.8 lb

## 2021-10-20 DIAGNOSIS — I10 Essential (primary) hypertension: Secondary | ICD-10-CM

## 2021-10-20 DIAGNOSIS — E559 Vitamin D deficiency, unspecified: Secondary | ICD-10-CM

## 2021-10-20 DIAGNOSIS — D649 Anemia, unspecified: Secondary | ICD-10-CM | POA: Diagnosis not present

## 2021-10-20 DIAGNOSIS — F0390 Unspecified dementia without behavioral disturbance: Secondary | ICD-10-CM

## 2021-10-20 DIAGNOSIS — E785 Hyperlipidemia, unspecified: Secondary | ICD-10-CM | POA: Diagnosis not present

## 2021-10-20 LAB — CBC WITH DIFFERENTIAL/PLATELET
Basophils Absolute: 0 10*3/uL (ref 0.0–0.1)
Basophils Relative: 0.3 % (ref 0.0–3.0)
Eosinophils Absolute: 0.1 10*3/uL (ref 0.0–0.7)
Eosinophils Relative: 0.8 % (ref 0.0–5.0)
HCT: 39.4 % (ref 36.0–46.0)
Hemoglobin: 13 g/dL (ref 12.0–15.0)
Lymphocytes Relative: 31.9 % (ref 12.0–46.0)
Lymphs Abs: 2.4 10*3/uL (ref 0.7–4.0)
MCHC: 33 g/dL (ref 30.0–36.0)
MCV: 87.7 fl (ref 78.0–100.0)
Monocytes Absolute: 0.7 10*3/uL (ref 0.1–1.0)
Monocytes Relative: 8.8 % (ref 3.0–12.0)
Neutro Abs: 4.4 10*3/uL (ref 1.4–7.7)
Neutrophils Relative %: 58.2 % (ref 43.0–77.0)
Platelets: 123 10*3/uL — ABNORMAL LOW (ref 150.0–400.0)
RBC: 4.49 Mil/uL (ref 3.87–5.11)
RDW: 13.6 % (ref 11.5–15.5)
WBC: 7.5 10*3/uL (ref 4.0–10.5)

## 2021-10-20 LAB — BASIC METABOLIC PANEL
BUN: 22 mg/dL (ref 6–23)
CO2: 30 mEq/L (ref 19–32)
Calcium: 9.9 mg/dL (ref 8.4–10.5)
Chloride: 109 mEq/L (ref 96–112)
Creatinine, Ser: 0.65 mg/dL (ref 0.40–1.20)
GFR: 82.28 mL/min (ref >60.00)
Glucose, Bld: 111 mg/dL — ABNORMAL HIGH (ref 70–99)
Potassium: 3.5 mEq/L (ref 3.5–5.1)
Sodium: 146 mEq/L — ABNORMAL HIGH (ref 135–145)

## 2021-10-20 LAB — HEPATIC FUNCTION PANEL
ALT: 9 U/L (ref 0–35)
AST: 15 U/L (ref 0–37)
Albumin: 4.4 g/dL (ref 3.5–5.2)
Alkaline Phosphatase: 41 U/L (ref 39–117)
Bilirubin, Direct: 0.2 mg/dL (ref 0.0–0.3)
Total Bilirubin: 1.4 mg/dL — ABNORMAL HIGH (ref 0.2–1.2)
Total Protein: 7.1 g/dL (ref 6.0–8.3)

## 2021-10-20 LAB — IBC PANEL
Iron: 68 ug/dL (ref 42–145)
Saturation Ratios: 19 % — ABNORMAL LOW (ref 20.0–50.0)
TIBC: 358.4 ug/dL (ref 250.0–450.0)
Transferrin: 256 mg/dL (ref 212.0–360.0)

## 2021-10-20 LAB — TSH: TSH: 0.98 u[IU]/mL (ref 0.35–5.50)

## 2021-10-20 LAB — LIPID PANEL
Cholesterol: 211 mg/dL — ABNORMAL HIGH (ref 0–200)
HDL: 64.8 mg/dL (ref >39.00)
LDL Cholesterol: 112 mg/dL — ABNORMAL HIGH (ref 0–99)
NonHDL: 146.67
Total CHOL/HDL Ratio: 3
Triglycerides: 172 mg/dL — ABNORMAL HIGH (ref 0.0–149.0)
VLDL: 34.4 mg/dL (ref 0.0–40.0)

## 2021-10-20 LAB — VITAMIN B12: Vitamin B-12: 388 pg/mL (ref 211–911)

## 2021-10-20 LAB — VITAMIN D 25 HYDROXY (VIT D DEFICIENCY, FRACTURES): VITD: 29.91 ng/mL — ABNORMAL LOW (ref 30.00–100.00)

## 2021-10-20 LAB — FERRITIN: Ferritin: 59.9 ng/mL (ref 10.0–291.0)

## 2021-10-20 NOTE — Progress Notes (Signed)
Patient ID: Madeline Mcdaniel, female   DOB: 1941/02/11, 80 y.o.   MRN: [id number]       Chief Complaint: follow up anemia, dementia, htn, hld       HPI:  Madeline Mcdaniel is a 80 y.o. female here with husband who serves as her primary caretaker, as although she is ambulatory without need for cane or assist, she now requires total care due to dementia without behavioral disturbance, including unable to coherently express her needs, bowel and bladder incontinence, eating and all ADL assistance, sleep up to 12 hrs, low and minimal physical exertions, and refuses to take any and all meds.   Pt denies fever, wt loss, night sweats, loss of appetite, or other constitutional symptoms  No other new complaints       Wt Readings from Last 3 Encounters:  10/20/21 95 lb 12.8 oz (43.5 kg)  09/18/21 97 lb (44 kg)  06/16/21 101 lb 6.4 oz (46 kg)   BP Readings from Last 3 Encounters:  10/20/21 114/70  09/18/21 (!) 151/84  06/16/21 104/72         Past Medical History:  Diagnosis Date   Allergic rhinitis, cause unspecified 04/27/2011   Anemia 04/27/2011   Asthma 04/27/2011   Bipolar affective disorder ([place]) 04/27/2011   Cervical spondylosis 06/01/2012   Chronic bronchitis 04/27/2011   Chronic cystitis 04/27/2011   Colon polyps 04/27/2011   Degenerative joint disease 04/27/2011   GERD (gastroesophageal reflux disease) 04/27/2011   HTN (hypertension) 04/27/2011   Hyperlipidemia 04/27/2011   Mild cognitive impairment 04/12/2013   Osteopenia 06/01/2012   Recurrent UTI 04/27/2011   Right carpal tunnel syndrome 07/22/2012   TIA (transient ischemic attack) 04/27/2011   Urinary incontinence 04/27/2011   Past Surgical History:  Procedure Laterality Date   BLADDER SUSPENSION     A&P REPAIR WITH MESH   BREAST BIOPSY     BREAST LUMPECTOMY     SEVERAL TIMES   BUNIONECTOMY     LEFT FOOT   CHOLECYSTECTOMY     COLONOSCOPY  59YRS AGO   IN [place],PT DOES NOT REMEMBER DOCTOR   [facility] Bunionectomy Left 09/09/2014   @ [place] Left 09/09/2014   @ [place]   POLYPECTOMY  10 YRS AGO   BENIGN POLYPS X2   STERIOTACTIC BREAST BIOPSY     RIGHT   TENDON TRANSFER  12/09/2012   Procedure: TENDON TRANSFER;  Surgeon: Wynonia Sours, MD;  Location: [facility];  Service: Orthopedics;  Laterality: Left;  [place] PIP with FDS Slip Left ring finger, [place]   TONSILLECTOMY     TOTAL ABDOMINAL HYSTERECTOMY     TRIGGER FINGER RELEASE     LEFT RING FINGER    reports that she has never smoked. She has never used smokeless tobacco. She reports that she does not drink alcohol and does not use drugs. family history includes Cancer (age of onset: 87) in her mother; Colon cancer in her paternal grandfather. Allergies  Allergen Reactions   Aspirin Anaphylaxis   Nsaids Anaphylaxis    BECAUSE OF REACTION TO VIOXX BECAUSE OF REACTION TO VIOXX   Other Anaphylaxis    BECAUSE OF REACTION TO VIOXX BECAUSE OF REACTION TO VIOXX BECAUSE OF REACTION TO VIOXX   Vioxx [Rofecoxib] Anaphylaxis   Ciprofloxacin Other (See Comments)    HEADACHE HEADACHE   Current Outpatient Medications on File Prior to Visit  Medication Sig  Dispense Refill   acetaminophen (TYLENOL) 500 MG tablet Take by mouth. (Patient not taking: Reported on 09/18/2021)     Cholecalciferol (THERA-D 2000) 50 MCG (2000 UT) TABS 1 tab by mouth once daily (Patient not taking: Reported on 09/18/2021) 90 tablet 99   No current facility-administered medications on file prior to visit.        ROS:  All others reviewed and negative.  Objective        PE:  BP 114/70 (BP Location: Right Arm, Patient Position: Sitting, Cuff Size: Normal)   Pulse (!) 43   Ht 4\' 9"  (1.448 m)   Wt 95 lb 12.8 oz (43.5 kg)   SpO2 100%   BMI 20.73 kg/m                 Constitutional: Pt appears in NAD               HENT: Head: NCAT.                Right Ear: External ear normal.                 Left Ear:  External ear normal.                Eyes: . Pupils are equal, round, and reactive to light. Conjunctivae and EOM are normal               Nose: without d/c or deformity               Neck: Neck supple. Gross normal ROM               Cardiovascular: Normal rate and regular rhythm.                 Pulmonary/Chest: Effort normal and breath sounds without rales or wheezing.                Abd:  Soft, NT, ND, + BS, no organomegaly               Neurological: Pt is alert. At baseline orientation, motor grossly intact               Skin: Skin is warm. No rashes, no other new lesions, LE edema - none               Psychiatric: Pt behavior is normal without agitation   Micro: none  Cardiac tracings I have personally interpreted today:  none  Pertinent Radiological findings (summarize): none   Lab Results  Component Value Date   WBC 7.5 10/20/2021   HGB 13.0 10/20/2021   HCT 39.4 10/20/2021   PLT 123.0 (L) 10/20/2021   GLUCOSE 111 (H) 10/20/2021   CHOL 211 (H) 10/20/2021   TRIG 172.0 (H) 10/20/2021   HDL 64.80 10/20/2021   LDLDIRECT 113.9 04/08/2013   LDLCALC 112 (H) 10/20/2021   ALT 9 10/20/2021   AST 15 10/20/2021   NA 146 (H) 10/20/2021   K 3.5 10/20/2021   CL 109 10/20/2021   CREATININE 0.65 10/20/2021   BUN 22 10/20/2021   CO2 30 10/20/2021   TSH 0.98 10/20/2021   HGBA1C 5.7 01/18/2021   Assessment/Plan:  Madeline Mcdaniel is a 80 y.o. White or Caucasian [1] female with  has a past medical history of Allergic rhinitis, cause unspecified (04/27/2011), Anemia (04/27/2011), Asthma (04/27/2011), Bipolar affective disorder ([place]) (04/27/2011), Cervical spondylosis (06/01/2012), Chronic bronchitis (04/27/2011), Chronic cystitis (04/27/2011), Colon polyps (04/27/2011), Degenerative  joint disease (04/27/2011), GERD (gastroesophageal reflux disease) (04/27/2011), HTN (hypertension) (04/27/2011), Hyperlipidemia (04/27/2011), Mild cognitive impairment (04/12/2013), Osteopenia (06/01/2012), Recurrent UTI  (04/27/2011), Right carpal tunnel syndrome (07/22/2012), TIA (transient ischemic attack) (04/27/2011), and Urinary incontinence (04/27/2011).  Dementia without behavioral disturbance (HCC) Severe, ambulatory but requires total care, husband doing this successfully so far and not wanting placement for now,  to f/u any worsening symptoms or concerns  HTN (hypertension) BP Readings from Last 3 Encounters:  10/20/21 114/70  09/18/21 (!) 151/84  06/16/21 104/72   Stable, pt refuses med tx   Anemia With mild possible worsening recent, for f/u lab  Hyperlipidemia Lab Results  Component Value Date   LDLCALC 112 (H) 10/20/2021   Mild uncontrolled, goal ldl < 100, pt to continue low chol diet, declines statin   Vitamin D deficiency Last vitamin D Lab Results  Component Value Date   VD25OH 29.91 (L) 10/20/2021   Low, to start oral replacement if she will accept  Followup: Return in about 6 months (around 04/19/2022).  Cathlean Cower, MD 10/21/2021 6:40 AM [facility]

## 2021-10-20 NOTE — Patient Instructions (Signed)

## 2021-10-21 ENCOUNTER — Encounter: Payer: Self-pay | Admitting: Internal Medicine

## 2021-10-21 NOTE — Assessment & Plan Note (Signed)
BP Readings from Last 3 Encounters:  10/20/21 114/70  09/18/21 (!) 151/84  06/16/21 104/72   Stable, pt refuses med tx

## 2021-10-21 NOTE — Assessment & Plan Note (Signed)
With mild possible worsening recent, for f/u lab

## 2021-10-21 NOTE — Assessment & Plan Note (Signed)
Severe, ambulatory but requires total care, husband doing this successfully so far and not wanting placement for now,  to f/u any worsening symptoms or concerns

## 2021-10-21 NOTE — Assessment & Plan Note (Signed)
Lab Results  Component Value Date   LDLCALC 112 (H) 10/20/2021   Mild uncontrolled, goal ldl < 100, pt to continue low chol diet, declines statin

## 2021-10-21 NOTE — Assessment & Plan Note (Signed)
Last vitamin D Lab Results  Component Value Date   VD25OH 29.91 (L) 10/20/2021   Low, to start oral replacement if she will accept

## 2021-11-22 DIAGNOSIS — N3941 Urge incontinence: Secondary | ICD-10-CM | POA: Diagnosis not present

## 2021-11-22 DIAGNOSIS — Z87440 Personal history of urinary (tract) infections: Secondary | ICD-10-CM | POA: Diagnosis not present

## 2022-03-30 ENCOUNTER — Telehealth: Payer: Self-pay | Admitting: Internal Medicine

## 2022-03-30 ENCOUNTER — Ambulatory Visit (INDEPENDENT_AMBULATORY_CARE_PROVIDER_SITE_OTHER): Payer: Medicare Other

## 2022-03-30 DIAGNOSIS — Z0000 Encounter for general adult medical examination without abnormal findings: Secondary | ICD-10-CM | POA: Diagnosis not present

## 2022-03-30 NOTE — Patient Instructions (Signed)
Madeline Mcdaniel , ?Thank you for taking time to come for your Medicare Wellness Visit. I appreciate your ongoing commitment to your health goals. Please review the following plan we discussed and let me know if I can assist you in the future.  ? ?Screening recommendations/referrals: ?Colonoscopy: no longer required  ?Mammogram: no longer required  ?Bone Density: 02/247/2017 ?Recommended yearly ophthalmology/optometry visit for glaucoma screening and checkup ?Recommended yearly dental visit for hygiene and checkup ? ?Vaccinations: ?Influenza vaccine: completed  ?Pneumococcal vaccine: completed  ?Tdap vaccine: 04/08/2013 ?Shingles vaccine: will consider  ? ?Advanced directives: yes  ? ?Conditions/risks identified: none  ? ?Next appointment: none  ? ? ?Preventive Care 83 Years and Older, Female ?Preventive care refers to lifestyle choices and visits with your health care provider that can promote health and wellness. ?What does preventive care include? ?A yearly physical exam. This is also called an annual well check. ?Dental exams once or twice a year. ?Routine eye exams. Ask your health care provider how often you should have your eyes checked. ?Personal lifestyle choices, including: ?Daily care of your teeth and gums. ?Regular physical activity. ?Eating a healthy diet. ?Avoiding tobacco and drug use. ?Limiting alcohol use. ?Practicing safe sex. ?Taking low-dose aspirin every day. ?Taking vitamin and mineral supplements as recommended by your health care provider. ?What happens during an annual well check? ?The services and screenings done by your health care provider during your annual well check will depend on your age, overall health, lifestyle risk factors, and family history of disease. ?Counseling  ?Your health care provider may ask you questions about your: ?Alcohol use. ?Tobacco use. ?Drug use. ?Emotional well-being. ?Home and relationship well-being. ?Sexual activity. ?Eating habits. ?History of falls. ?Memory and  ability to understand (cognition). ?Work and work Statistician. ?Reproductive health. ?Screening  ?You may have the following tests or measurements: ?Height, weight, and BMI. ?Blood pressure. ?Lipid and cholesterol levels. These may be checked every 5 years, or more frequently if you are over 71 years old. ?Skin check. ?Lung cancer screening. You may have this screening every year starting at age 31 if you have a 30-pack-year history of smoking and currently smoke or have quit within the past 15 years. ?Fecal occult blood test (FOBT) of the stool. You may have this test every year starting at age 32. ?Flexible sigmoidoscopy or colonoscopy. You may have a sigmoidoscopy every 5 years or a colonoscopy every 10 years starting at age 51. ?Hepatitis C blood test. ?Hepatitis B blood test. ?Sexually transmitted disease (STD) testing. ?Diabetes screening. This is done by checking your blood sugar (glucose) after you have not eaten for a while (fasting). You may have this done every 1-3 years. ?Bone density scan. This is done to screen for osteoporosis. You may have this done starting at age 62. ?Mammogram. This may be done every 1-2 years. Talk to your health care provider about how often you should have regular mammograms. ?Talk with your health care provider about your test results, treatment options, and if necessary, the need for more tests. ?Vaccines  ?Your health care provider may recommend certain vaccines, such as: ?Influenza vaccine. This is recommended every year. ?Tetanus, diphtheria, and acellular pertussis (Tdap, Td) vaccine. You may need a Td booster every 10 years. ?Zoster vaccine. You may need this after age 35. ?Pneumococcal 13-valent conjugate (PCV13) vaccine. One dose is recommended after age 12. ?Pneumococcal polysaccharide (PPSV23) vaccine. One dose is recommended after age 51. ?Talk to your health care provider about which screenings and vaccines you  need and how often you need them. ?This information is  not intended to replace advice given to you by your health care provider. Make sure you discuss any questions you have with your health care provider. ?Document Released: 12/16/2015 Document Revised: 08/08/2016 Document Reviewed: 09/20/2015 ?Elsevier Interactive Patient Education ? 2017 [place]. ? ?Fall Prevention in the Home ?Falls can cause injuries. They can happen to people of all ages. There are many things you can do to make your home safe and to help prevent falls. ?What can I do on the outside of my home? ?Regularly fix the edges of walkways and driveways and fix any cracks. ?Remove anything that might make you trip as you walk through a door, such as a raised step or threshold. ?Trim any bushes or trees on the path to your home. ?Use bright outdoor lighting. ?Clear any walking paths of anything that might make someone trip, such as rocks or tools. ?Regularly check to see if handrails are loose or broken. Make sure that both sides of any steps have handrails. ?Any raised decks and porches should have guardrails on the edges. ?Have any leaves, snow, or ice cleared regularly. ?Use sand or salt on walking paths during winter. ?Clean up any spills in your garage right away. This includes oil or grease spills. ?What can I do in the bathroom? ?Use night lights. ?Install grab bars by the toilet and in the tub and shower. Do not use towel bars as grab bars. ?Use non-skid mats or decals in the tub or shower. ?If you need to sit down in the shower, use a plastic, non-slip stool. ?Keep the floor dry. Clean up any water that spills on the floor as soon as it happens. ?Remove soap buildup in the tub or shower regularly. ?Attach bath mats securely with double-sided non-slip rug tape. ?Do not have throw rugs and other things on the floor that can make you trip. ?What can I do in the bedroom? ?Use night lights. ?Make sure that you have a light by your bed that is easy to reach. ?Do not use any sheets or blankets that  are too big for your bed. They should not hang down onto the floor. ?Have a firm chair that has side arms. You can use this for support while you get dressed. ?Do not have throw rugs and other things on the floor that can make you trip. ?What can I do in the kitchen? ?Clean up any spills right away. ?Avoid walking on wet floors. ?Keep items that you use a lot in easy-to-reach places. ?If you need to reach something above you, use a strong step stool that has a grab bar. ?Keep electrical cords out of the way. ?Do not use floor polish or wax that makes floors slippery. If you must use wax, use non-skid floor wax. ?Do not have throw rugs and other things on the floor that can make you trip. ?What can I do with my stairs? ?Do not leave any items on the stairs. ?Make sure that there are handrails on both sides of the stairs and use them. Fix handrails that are broken or loose. Make sure that handrails are as long as the stairways. ?Check any carpeting to make sure that it is firmly attached to the stairs. Fix any carpet that is loose or worn. ?Avoid having throw rugs at the top or bottom of the stairs. If you do have throw rugs, attach them to the floor with carpet tape. ?Make sure that  you have a light switch at the top of the stairs and the bottom of the stairs. If you do not have them, ask someone to add them for you. ?What else can I do to help prevent falls? ?Wear shoes that: ?Do not have high heels. ?Have rubber bottoms. ?Are comfortable and fit you well. ?Are closed at the toe. Do not wear sandals. ?If you use a stepladder: ?Make sure that it is fully opened. Do not climb a closed stepladder. ?Make sure that both sides of the stepladder are locked into place. ?Ask someone to hold it for you, if possible. ?Clearly mark and make sure that you can see: ?Any grab bars or handrails. ?First and last steps. ?Where the edge of each step is. ?Use tools that help you move around (mobility aids) if they are needed. These  include: ?Canes. ?Walkers. ?Scooters. ?Crutches. ?Turn on the lights when you go into a dark area. Replace any light bulbs as soon as they burn out. ?Set up your furniture so you have a clear path. Avoid

## 2022-03-30 NOTE — Progress Notes (Signed)
? ?Subjective:  ? Madeline Mcdaniel is a 81 y.o. female who presents for Medicare Annual (Subsequent) preventive examination. ? ? ?I connected with  Madeline Mcdaniel today by telephone and verified that I am speaking with the correct person using two identifiers. ?Location patient: home ?Location provider: work ?Persons participating in the virtual visit: patient, provider. ?  ?I discussed the limitations, risks, security and privacy concerns of performing an evaluation and management service by telephone and the availability of in person appointments. I also discussed with the patient that there may be a patient responsible charge related to this service. The patient expressed understanding and verbally consented to this telephonic visit.  ?  ?Interactive audio and video telecommunications were attempted between this provider and patient, however failed, due to patient having technical difficulties OR patient did not have access to video capability.  We continued and completed visit with audio only. ? ?  ?Review of Systems    ? ?Cardiac Risk Factors include: advanced age (>8mn, >>72women) ? ?   ?Objective:  ?  ?Today's Vitals  ? ?There is no height or weight on file to calculate BMI. ? ? ?  03/30/2022  ?  2:12 PM 09/18/2021  ?  3:12 PM 03/22/2021  ?  1:08 PM 01/31/2021  ?  8:48 AM 12/23/2020  ?  9:23 AM 12/16/2017  ?  1:44 PM 01/30/2016  ?  2:30 PM  ?Advanced Directives  ?Does Patient Have a Medical Advance Directive? Yes Yes Yes Yes Yes Yes Yes  ?Type of [place]amed[place]of [place] will  Living will;Healthcare Power of [place]   ?Does patient want to make changes to medical advance directive?   No - Patient declined No - Patient declined  No - Patient declined   ?Copy of H[place] Chart? No - copy requested  No - copy requested   No - copy requested No - copy requested  ? ? ?Current Medications  (verified) ?Outpatient Encounter Medications as of 03/30/2022  ?Medication Sig  ? acetaminophen (TYLENOL) 500 MG tablet Take by mouth. (Patient not taking: Reported on 09/18/2021)  ? Cholecalciferol (THERA-D 2000) 50 MCG (2000 UT) TABS 1 tab by mouth once daily (Patient not taking: Reported on 09/18/2021)  ? ?No facility-administered encounter medications on file as of 03/30/2022.  ? ? ?Allergies (verified) ?Aspirin, Nsaids, Other, Vioxx [rofecoxib], and Ciprofloxacin  ? ?History: ?Past Medical History:  ?Diagnosis Date  ? Allergic rhinitis, cause unspecified 04/27/2011  ? Anemia 04/27/2011  ? Asthma 04/27/2011  ? Bipolar affective disorder ([place] 04/27/2011  ? Cervical spondylosis 06/01/2012  ? Chronic bronchitis 04/27/2011  ? Chronic cystitis 04/27/2011  ? Colon polyps 04/27/2011  ? Degenerative joint disease 04/27/2011  ? GERD (gastroesophageal reflux disease) 04/27/2011  ? HTN (hypertension) 04/27/2011  ? Hyperlipidemia 04/27/2011  ? Mild cognitive impairment 04/12/2013  ? Osteopenia 06/01/2012  ? Recurrent UTI 04/27/2011  ? Right carpal tunnel syndrome 07/22/2012  ? TIA (transient ischemic attack) 04/27/2011  ? Urinary incontinence 04/27/2011  ? ?Past Surgical History:  ?Procedure Laterality Date  ? BLADDER SUSPENSION    ? A&P REPAIR WITH MESH  ? BREAST BIOPSY    ? BREAST LUMPECTOMY    ? SEVERAL TIMES  ? BUNIONECTOMY    ? LEFT FOOT  ? CHOLECYSTECTOMY    ? COLONOSCOPY  15YRS AGO  ? IN [place],PT DOES NOT REMEMBER DOCTOR  ? McBride Bunionectomy Left 09/09/2014  ? @  [place]  ? NEURECTOMY FOOT Left 09/09/2014  ? @ [place]  ? [place]    ? BOTH THUMBS  ? POLYPECTOMY  10 YRS AGO  ? BENIGN POLYPS X2  ? STERIOTACTIC BREAST BIOPSY    ? RIGHT  ? TENDON TRANSFER  12/09/2012  ? Procedure: TENDON TRANSFER;  Surgeon: Wynonia Sours, MD;  Location: [place];  Service: Orthopedics;  Laterality: Left;  Tenodesis PIP with FDS Slip Left ring finger, [place]  ? TONSILLECTOMY    ? TOTAL ABDOMINAL HYSTERECTOMY     ? TRIGGER FINGER RELEASE    ? LEFT RING FINGER  ? ?Family History  ?Problem Relation Age of Onset  ? Cancer Mother 90  ?     RENAL CELL CARCINOMA  ? Colon cancer Paternal Grandfather   ? Breast cancer Neg Hx   ? ?Social History  ? ?Socioeconomic History  ? Marital status: Married  ?  Spouse name: Not on file  ? Number of children: 2  ? Years of education: Not on file  ? Highest education level: Not on file  ?Occupational History  ? Not on file  ?Tobacco Use  ? Smoking status: Never  ? Smokeless tobacco: Never  ?Substance and Sexual Activity  ? Alcohol use: No  ? Drug use: No  ? Sexual activity: Not Currently  ?Other Topics Concern  ? Not on file  ?Social History Narrative  ? Not on file  ? ?Social Determinants of Health  ? ?Financial Resource Strain: Low Risk   ? Difficulty of Paying Living Expenses: Not hard at all  ?Food Insecurity: No Food Insecurity  ? Worried About Charity fundraiser in the Last Year: Never true  ? Ran Out of Food in the Last Year: Never true  ?Transportation Needs: No Transportation Needs  ? Lack of Transportation (Medical): No  ? Lack of Transportation (Non-Medical): No  ?Physical Activity: Inactive  ? Days of Exercise per Week: 0 days  ? Minutes of Exercise per Session: 0 min  ?Stress: No Stress Concern Present  ? Feeling of Stress : Not at all  ?Social Connections: Moderately Isolated  ? Frequency of Communication with Friends and Family: Three times a week  ? Frequency of Social Gatherings with Friends and Family: Three times a week  ? Attends Religious Services: Never  ? Active Member of Clubs or Organizations: No  ? Attends Archivist Meetings: Never  ? Marital Status: Married  ? ? ?Tobacco Counseling ?Counseling given: Not Answered ? ? ?Clinical Intake: ? ?Pre-visit preparation completed: Yes ? ?Pain : No/denies pain ? ?  ? ?Nutritional Risks: None ?Diabetes: No ? 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 ?Diabetic?no  ? ?Interpreter Needed?: No ? ?Information entered by :: [email or web address] ? ? ?Activities of Daily Living ? ?  03/30/2022  ?  2:13 PM  ?In your present state of health, do you have any difficulty performing the following activities:  ?Hearing? 0  ?Vision? 0  ?Difficulty concentrating or making decisions? 0  ?Walking or climbing stairs? 0  ?Dressing or bathing? 0  ?Doing errands, shopping? 0  ?Preparing Food and eating ? N  ?Using the Toilet? N  ?In the past six months, have you accidently leaked urine? N  ?Do you have problems with loss  of bowel control? N  ?Managing your Medications? N  ?Managing your Finances? N  ?Housekeeping or managing your Housekeeping? N  ? ? ?Patient Care Team: ?Biagio Borg, MD as PCP - General (Internal Medicine) ? ?Indicate any recent Medical Services you may have received from other than [facility] providers in the past year (date may be approximate). ? ?   ?Assessment:  ? This is a routine wellness examination for [place]. ? ?Hearing/Vision screen ?Vision Screening - Comments:: Annual eye exams  ? ?Dietary issues and exercise activities discussed: ?Current Exercise Habits: The patient does not participate in regular exercise at present, Exercise limited by: None identified ? ? Goals Addressed   ?None ?  ? ?Depression Screen ? ?  03/30/2022  ?  2:13 PM 03/30/2022  ?  2:10 PM 03/22/2021  ?  1:06 PM 01/17/2021  ?  4:36 PM 01/14/2020  ?  4:35 PM 01/12/2019  ?  3:39 PM 01/30/2018  ?  2:00 PM  ?PHQ 2/9 Scores  ?PHQ - 2 Score 0 0 0 0 0 0 0  ?PHQ- 9 Score       0  ?  ?Fall Risk ? ?  03/30/2022  ?  2:12 PM 09/18/2021  ?  3:11 PM 03/22/2021  ?  1:08 PM 01/17/2021  ?  4:35 PM 12/23/2020  ?  9:22 AM  ?Fall Risk   ?Falls in the past year? 0 '1 1 1 1  '$ ?Number falls in past yr: 0 0 '1 1 1  '$ ?Injury with Fall? 0 '1 1 1 '$ 0  ?Risk for fall due to :   History of fall(s);Impaired balance/gait;Mental status change     ?Follow up Falls evaluation completed  Falls evaluation completed    ? ? ?FALL RISK PREVENTION PERTAINING TO THE HOME: ? ?Any stairs in or around the home? Yes  ?If so, are there any without handrails? No  ?Home

## 2022-04-20 ENCOUNTER — Ambulatory Visit (INDEPENDENT_AMBULATORY_CARE_PROVIDER_SITE_OTHER): Payer: Medicare Other | Admitting: Internal Medicine

## 2022-04-20 ENCOUNTER — Ambulatory Visit (INDEPENDENT_AMBULATORY_CARE_PROVIDER_SITE_OTHER): Payer: Medicare Other

## 2022-04-20 ENCOUNTER — Ambulatory Visit: Payer: Medicare Other | Attending: Internal Medicine

## 2022-04-20 VITALS — BP 124/80 | HR 51 | Temp 97.9°F | Ht <= 58 in | Wt 101.1 lb

## 2022-04-20 DIAGNOSIS — R062 Wheezing: Secondary | ICD-10-CM

## 2022-04-20 DIAGNOSIS — R079 Chest pain, unspecified: Secondary | ICD-10-CM | POA: Diagnosis not present

## 2022-04-20 DIAGNOSIS — Z23 Encounter for immunization: Secondary | ICD-10-CM

## 2022-04-20 DIAGNOSIS — R739 Hyperglycemia, unspecified: Secondary | ICD-10-CM | POA: Diagnosis not present

## 2022-04-20 DIAGNOSIS — E559 Vitamin D deficiency, unspecified: Secondary | ICD-10-CM

## 2022-04-20 DIAGNOSIS — I10 Essential (primary) hypertension: Secondary | ICD-10-CM | POA: Diagnosis not present

## 2022-04-20 DIAGNOSIS — E7800 Pure hypercholesterolemia, unspecified: Secondary | ICD-10-CM | POA: Diagnosis not present

## 2022-04-20 NOTE — Progress Notes (Unsigned)
Patient ID: Madeline Mcdaniel, female   DOB: 1941/09/13, 81 y.o.   MRN: [id number]        Chief Complaint: follow up hld, hyperglycemia, low vit d and right chest wheeze noted       HPI:  Madeline Mcdaniel is a 81 y.o. female here with husband, overall doing well; Pt denies chest pain, increased sob or doe, wheezing, orthopnea, PND, increased LE swelling, palpitations, dizziness or syncope.   Pt denies polydipsia, polyuria, or new focal neuro s/s.    Pt denies fever, wt loss, night sweats, loss of appetite, or other constitutional symptoms  Denies worsening depressive symptoms, suicidal ideation, or panic; Not taking Vit d.  In fact resists taking any meds at all.         Wt Readings from Last 3 Encounters:  04/20/22 101 lb 2 oz (45.9 kg)  10/20/21 95 lb 12.8 oz (43.5 kg)  09/18/21 97 lb (44 kg)   BP Readings from Last 3 Encounters:  04/20/22 124/80  10/20/21 114/70  09/18/21 (!) 151/84         Past Medical History:  Diagnosis Date   Allergic rhinitis, cause unspecified 04/27/2011   Anemia 04/27/2011   Asthma 04/27/2011   Bipolar affective disorder ([place]) 04/27/2011   Cervical spondylosis 06/01/2012   Chronic bronchitis 04/27/2011   Chronic cystitis 04/27/2011   Colon polyps 04/27/2011   Degenerative joint disease 04/27/2011   GERD (gastroesophageal reflux disease) 04/27/2011   HTN (hypertension) 04/27/2011   Hyperlipidemia 04/27/2011   Mild cognitive impairment 04/12/2013   Osteopenia 06/01/2012   Recurrent UTI 04/27/2011   Right carpal tunnel syndrome 07/22/2012   TIA (transient ischemic attack) 04/27/2011   Urinary incontinence 04/27/2011   Past Surgical History:  Procedure Laterality Date   BLADDER SUSPENSION     A&P REPAIR WITH MESH   BREAST BIOPSY     BREAST LUMPECTOMY     SEVERAL TIMES   BUNIONECTOMY     LEFT FOOT   CHOLECYSTECTOMY     COLONOSCOPY  58YRS AGO   IN [place],PT DOES NOT REMEMBER DOCTOR   McBride Bunionectomy Left 09/09/2014   @ [place] Left  09/09/2014   @ [place]   POLYPECTOMY  10 YRS AGO   BENIGN POLYPS X2   STERIOTACTIC BREAST BIOPSY     RIGHT   TENDON TRANSFER  12/09/2012   Procedure: TENDON TRANSFER;  Surgeon: Wynonia Sours, MD;  Location: [facility];  Service: Orthopedics;  Laterality: Left;  Tenodesis PIP with FDS Slip Left ring finger, [place]   TONSILLECTOMY     TOTAL ABDOMINAL HYSTERECTOMY     TRIGGER FINGER RELEASE     LEFT RING FINGER    reports that she has never smoked. She has never used smokeless tobacco. She reports that she does not drink alcohol and does not use drugs. family history includes Cancer (age of onset: 11) in her mother; Colon cancer in her paternal grandfather. Allergies  Allergen Reactions   Aspirin Anaphylaxis   Nsaids Anaphylaxis    BECAUSE OF REACTION TO VIOXX BECAUSE OF REACTION TO VIOXX   Other Anaphylaxis    BECAUSE OF REACTION TO VIOXX BECAUSE OF REACTION TO VIOXX BECAUSE OF REACTION TO VIOXX   Vioxx [Rofecoxib] Anaphylaxis   Ciprofloxacin Other (See Comments)    HEADACHE HEADACHE   Current Outpatient Medications on File Prior to Visit  Medication Sig Dispense Refill  acetaminophen (TYLENOL) 500 MG tablet Take by mouth.     Cholecalciferol (THERA-D 2000) 50 MCG (2000 UT) TABS 1 tab by mouth once daily 90 tablet 99   No current facility-administered medications on file prior to visit.        ROS:  All others reviewed and negative.  Objective        PE:  BP 124/80   Pulse (!) 51   Temp 97.9 F (36.6 C) (Oral)   Ht '4\' 9"'$  (1.448 m)   Wt 101 lb 2 oz (45.9 kg)   SpO2 93%   BMI 21.88 kg/m                 Constitutional: Pt appears in NAD               HENT: Head: NCAT.                Right Ear: External ear normal.                 Left Ear: External ear normal.                Eyes: . Pupils are equal, round, and reactive to light. Conjunctivae and EOM are normal               Nose: without d/c or deformity                Neck: Neck supple. Gross normal ROM               Cardiovascular: Normal rate and regular rhythm.                 Pulmonary/Chest: Effort normal and breath sounds without rales but has noted persistent right chest isolated wheeze               Abd:  Soft, NT, ND, + BS, no organomegaly               Neurological: Pt is alert. At baseline orientation, motor grossly intact               Skin: Skin is warm. No rashes, no other new lesions, LE edema - none               Psychiatric: Pt behavior is normal without agitation   Micro: none  Cardiac tracings I have personally interpreted today:  none  Pertinent Radiological findings (summarize): none   Lab Results  Component Value Date   WBC 7.5 10/20/2021   HGB 13.0 10/20/2021   HCT 39.4 10/20/2021   PLT 123.0 (L) 10/20/2021   GLUCOSE 111 (H) 10/20/2021   CHOL 211 (H) 10/20/2021   TRIG 172.0 (H) 10/20/2021   HDL 64.80 10/20/2021   LDLDIRECT 113.9 04/08/2013   LDLCALC 112 (H) 10/20/2021   ALT 9 10/20/2021   AST 15 10/20/2021   NA 146 (H) 10/20/2021   K 3.5 10/20/2021   CL 109 10/20/2021   CREATININE 0.65 10/20/2021   BUN 22 10/20/2021   CO2 30 10/20/2021   TSH 0.98 10/20/2021   HGBA1C 5.7 01/18/2021   Assessment/Plan:  Madeline Mcdaniel is a 81 y.o. White or Caucasian [1] female with  has a past medical history of Allergic rhinitis, cause unspecified (04/27/2011), Anemia (04/27/2011), Asthma (04/27/2011), Bipolar affective disorder ([place]) (04/27/2011), Cervical spondylosis (06/01/2012), Chronic bronchitis (04/27/2011), Chronic cystitis (04/27/2011), Colon polyps (04/27/2011), Degenerative joint disease (04/27/2011), GERD (gastroesophageal reflux disease) (04/27/2011), HTN (hypertension) (04/27/2011), Hyperlipidemia (04/27/2011),  Mild cognitive impairment (04/12/2013), Osteopenia (06/01/2012), Recurrent UTI (04/27/2011), Right carpal tunnel syndrome (07/22/2012), TIA (transient ischemic attack) (04/27/2011), and Urinary incontinence  (04/27/2011).  Wheezing Isolated right chest  - for cxr,  to f/u any worsening symptoms or concerns  HTN (hypertension) BP Readings from Last 3 Encounters:  04/20/22 124/80  10/20/21 114/70  09/18/21 (!) 151/84   Stable, pt to continue medical treatment  - low salt diet, wt control, excericse  Vitamin D deficiency Last vitamin D Lab Results  Component Value Date   VD25OH 29.91 (L) 10/20/2021   Low to start oral replacement   Hyperglycemia Lab Results  Component Value Date   HGBA1C 5.7 01/18/2021   Stable, pt to continue current medical treatment  - diet   Hyperlipidemia Lab Results  Component Value Date   LDLCALC 112 (H) 10/20/2021   Uncontrolled, has hx of TIA, goal ldl < 70, pt conts to refuse statin  Followup: Return in about 6 months (around 10/21/2022).  Cathlean Cower, MD 04/21/2022 1:23 PM [facility]

## 2022-04-20 NOTE — Patient Instructions (Addendum)
Please take OTC Vitamin D3 at 2000 units per day, indefinitely  Please continue all other medications as before, and refills have been done if requested.  Please have the pharmacy call with any other refills you may need.  Please keep your appointments with your specialists as you may have planned  Please go to the XRAY Department in the first floor for the x-ray testing  You will be contacted by phone if any changes need to be made immediately.  Otherwise, you will receive a letter about your results with an explanation, but please check with MyChart first.  Please remember to sign up for MyChart if you have not done so, as this will be important to you in the future with finding out test results, communicating by private email, and scheduling acute appointments online when needed.  Please make an Appointment to return in 6 months, or sooner if needed

## 2022-04-20 NOTE — Progress Notes (Signed)
   Covid-19 Vaccination Clinic  Name:  Madeline Mcdaniel    MRN: [id number] DOB: [birth year]  04/20/2022  [name] was observed post Covid-19 immunization for 15 minutes without incident. She was provided with Vaccine Information Sheet and instruction to access the V-Safe system.   [name] was instructed to call 911 with any severe reactions post vaccine: Difficulty breathing  Swelling of face and throat  A fast heartbeat  A bad rash all over body  Dizziness and weakness   Immunizations Administered     Name Date Dose VIS Date Route   Pfizer Covid-19 Vaccine Bivalent Booster 04/20/2022 12:00 PM 0.3 mL 08/02/2021 Intramuscular   Manufacturer: [place]   Lot: [id number]   [place]: [phone]

## 2022-04-21 ENCOUNTER — Encounter: Payer: Self-pay | Admitting: Internal Medicine

## 2022-04-21 DIAGNOSIS — R739 Hyperglycemia, unspecified: Secondary | ICD-10-CM | POA: Insufficient documentation

## 2022-04-21 DIAGNOSIS — R062 Wheezing: Secondary | ICD-10-CM | POA: Insufficient documentation

## 2022-04-21 NOTE — Assessment & Plan Note (Signed)
Lab Results  Component Value Date   LDLCALC 112 (H) 10/20/2021   Uncontrolled, has hx of TIA, goal ldl < 70, pt conts to refuse statin

## 2022-04-21 NOTE — Assessment & Plan Note (Signed)
Isolated right chest  - for cxr,  to f/u any worsening symptoms or concerns

## 2022-04-21 NOTE — Assessment & Plan Note (Signed)
Lab Results  Component Value Date   HGBA1C 5.7 01/18/2021   Stable, pt to continue current medical treatment  - diet

## 2022-04-21 NOTE — Assessment & Plan Note (Signed)
Last vitamin D Lab Results  Component Value Date   VD25OH 29.91 (L) 10/20/2021   Low to start oral replacement

## 2022-04-21 NOTE — Assessment & Plan Note (Signed)
BP Readings from Last 3 Encounters:  04/20/22 124/80  10/20/21 114/70  09/18/21 (!) 151/84   Stable, pt to continue medical treatment  - low salt diet, wt control, excericse

## 2022-04-23 ENCOUNTER — Other Ambulatory Visit (HOSPITAL_BASED_OUTPATIENT_CLINIC_OR_DEPARTMENT_OTHER): Payer: Self-pay

## 2022-04-23 DIAGNOSIS — Z23 Encounter for immunization: Secondary | ICD-10-CM | POA: Diagnosis not present

## 2022-04-23 MED ORDER — PFIZER COVID-19 VAC BIVALENT 30 MCG/0.3ML IM SUSP
INTRAMUSCULAR | 0 refills | Status: DC
  Filled 2022-04-23: qty 0.3, 1d supply, fill #0

## 2022-09-02 DIAGNOSIS — U071 COVID-19: Secondary | ICD-10-CM | POA: Diagnosis not present

## 2022-09-02 DIAGNOSIS — R509 Fever, unspecified: Secondary | ICD-10-CM | POA: Diagnosis not present

## 2022-09-02 DIAGNOSIS — R051 Acute cough: Secondary | ICD-10-CM | POA: Diagnosis not present

## 2022-09-02 DIAGNOSIS — R531 Weakness: Secondary | ICD-10-CM | POA: Diagnosis not present

## 2022-09-06 ENCOUNTER — Other Ambulatory Visit: Payer: Self-pay | Admitting: Internal Medicine

## 2022-09-06 ENCOUNTER — Encounter: Payer: Self-pay | Admitting: Internal Medicine

## 2022-09-06 DIAGNOSIS — Z1231 Encounter for screening mammogram for malignant neoplasm of breast: Secondary | ICD-10-CM

## 2022-09-10 ENCOUNTER — Ambulatory Visit (INDEPENDENT_AMBULATORY_CARE_PROVIDER_SITE_OTHER): Payer: Medicare Other | Admitting: Internal Medicine

## 2022-09-10 ENCOUNTER — Encounter: Payer: Self-pay | Admitting: Internal Medicine

## 2022-09-10 ENCOUNTER — Other Ambulatory Visit: Payer: Self-pay | Admitting: Internal Medicine

## 2022-09-10 VITALS — BP 126/74 | HR 79 | Temp 98.7°F | Ht <= 58 in | Wt 94.0 lb

## 2022-09-10 DIAGNOSIS — E559 Vitamin D deficiency, unspecified: Secondary | ICD-10-CM

## 2022-09-10 DIAGNOSIS — J189 Pneumonia, unspecified organism: Secondary | ICD-10-CM | POA: Diagnosis not present

## 2022-09-10 DIAGNOSIS — E538 Deficiency of other specified B group vitamins: Secondary | ICD-10-CM | POA: Diagnosis not present

## 2022-09-10 DIAGNOSIS — R739 Hyperglycemia, unspecified: Secondary | ICD-10-CM

## 2022-09-10 DIAGNOSIS — I10 Essential (primary) hypertension: Secondary | ICD-10-CM

## 2022-09-10 DIAGNOSIS — Z23 Encounter for immunization: Secondary | ICD-10-CM | POA: Diagnosis not present

## 2022-09-10 LAB — CBC WITH DIFFERENTIAL/PLATELET
Basophils Absolute: 0 10*3/uL (ref 0.0–0.1)
Basophils Relative: 0.2 % (ref 0.0–3.0)
Eosinophils Absolute: 0.1 10*3/uL (ref 0.0–0.7)
Eosinophils Relative: 1 % (ref 0.0–5.0)
HCT: 43.8 % (ref 36.0–46.0)
Hemoglobin: 15 g/dL (ref 12.0–15.0)
Lymphocytes Relative: 27.2 % (ref 12.0–46.0)
Lymphs Abs: 2.9 10*3/uL (ref 0.7–4.0)
MCHC: 34.2 g/dL (ref 30.0–36.0)
MCV: 84.2 fl (ref 78.0–100.0)
Monocytes Absolute: 0.7 10*3/uL (ref 0.1–1.0)
Monocytes Relative: 6.8 % (ref 3.0–12.0)
Neutro Abs: 6.8 10*3/uL (ref 1.4–7.7)
Neutrophils Relative %: 64.8 % (ref 43.0–77.0)
Platelets: 158 10*3/uL (ref 150.0–400.0)
RBC: 5.2 Mil/uL — ABNORMAL HIGH (ref 3.87–5.11)
RDW: 13.2 % (ref 11.5–15.5)
WBC: 10.6 10*3/uL — ABNORMAL HIGH (ref 4.0–10.5)

## 2022-09-10 LAB — BASIC METABOLIC PANEL WITH GFR
BUN: 14 mg/dL (ref 6–23)
CO2: 30 meq/L (ref 19–32)
Calcium: 10.8 mg/dL — ABNORMAL HIGH (ref 8.4–10.5)
Chloride: 97 meq/L (ref 96–112)
Creatinine, Ser: 0.81 mg/dL (ref 0.40–1.20)
GFR: 67.42 mL/min
Glucose, Bld: 120 mg/dL — ABNORMAL HIGH (ref 70–99)
Potassium: 2.9 meq/L — ABNORMAL LOW (ref 3.5–5.1)
Sodium: 141 meq/L (ref 135–145)

## 2022-09-10 LAB — HEPATIC FUNCTION PANEL
ALT: 17 U/L (ref 0–35)
AST: 22 U/L (ref 0–37)
Albumin: 4.4 g/dL (ref 3.5–5.2)
Alkaline Phosphatase: 41 U/L (ref 39–117)
Bilirubin, Direct: 0.2 mg/dL (ref 0.0–0.3)
Total Bilirubin: 0.9 mg/dL (ref 0.2–1.2)
Total Protein: 7.9 g/dL (ref 6.0–8.3)

## 2022-09-10 LAB — LIPID PANEL
Cholesterol: 161 mg/dL (ref 0–200)
HDL: 58.1 mg/dL
LDL Cholesterol: 70 mg/dL (ref 0–99)
NonHDL: 102.55
Total CHOL/HDL Ratio: 3
Triglycerides: 165 mg/dL — ABNORMAL HIGH (ref 0.0–149.0)
VLDL: 33 mg/dL (ref 0.0–40.0)

## 2022-09-10 LAB — VITAMIN D 25 HYDROXY (VIT D DEFICIENCY, FRACTURES): VITD: 34.33 ng/mL (ref 30.00–100.00)

## 2022-09-10 LAB — TSH: TSH: 1.71 u[IU]/mL (ref 0.35–5.50)

## 2022-09-10 LAB — VITAMIN B12: Vitamin B-12: 485 pg/mL (ref 211–911)

## 2022-09-10 LAB — HEMOGLOBIN A1C: Hgb A1c MFr Bld: 6.1 % (ref 4.6–6.5)

## 2022-09-10 MED ORDER — POTASSIUM CHLORIDE ER 10 MEQ PO TBCR: 10.0000 meq | EXTENDED_RELEASE_TABLET | Freq: Every day | ORAL | 0 refills | Status: DC

## 2022-09-10 NOTE — Patient Instructions (Addendum)
You had the flu shot today  Please continue all other medications as before, and refills have been done if requested.  Please have the pharmacy call with any other refills you may need.  Please continue your efforts at being more active, low cholesterol diet, and weight control.  Please keep your appointments with your specialists as you may have planned  Please go to the LAB at the blood drawing area for the tests to be done  You will be contacted by phone if any changes need to be made immediately.  Otherwise, you will receive a letter about your results with an explanation, but please check with MyChart first.  Please remember to sign up for MyChart if you have not done so, as this will be important to you in the future with finding out test results, communicating by private email, and scheduling acute appointments online when needed.  Please make an Appointment to return in Nov 20 as planned

## 2022-09-10 NOTE — Progress Notes (Signed)
Patient ID: Madeline Mcdaniel, female   DOB: Jan 25, 1941, 81 y.o.   MRN: [id number]        Chief Complaint: follow up LLL CAP at [facility]       HPI:  Madeline Mcdaniel is a 81 y.o. female here to f/u after oct 1 exam and cxr c/w LLL pna, had recent COVID but could not rule out CAP, tx with augmentin and doxy course, finished both yesterday without complication.  Pt denies chest pain, increased sob or doe, wheezing, orthopnea, PND, increased LE swelling, palpitations, dizziness or syncope.   Pt denies polydipsia, polyuria, or new focal neuro s/s.    Pt denies fever, wt loss, night sweats, loss of appetite, or other constitutional symptoms  Dementia overall stable symptomatically, and not assoc with behavioral changes such as hallucinations, paranoia, or agitation. Due for flu shot Wt Readings from Last 3 Encounters:  09/10/22 94 lb (42.6 kg)  04/20/22 101 lb 2 oz (45.9 kg)  10/20/21 95 lb 12.8 oz (43.5 kg)   BP Readings from Last 3 Encounters:  09/10/22 126/74  04/20/22 124/80  10/20/21 114/70         Past Medical History:  Diagnosis Date   Allergic rhinitis, cause unspecified 04/27/2011   Anemia 04/27/2011   Asthma 04/27/2011   Bipolar affective disorder ([place]) 04/27/2011   Cervical spondylosis 06/01/2012   Chronic bronchitis 04/27/2011   Chronic cystitis 04/27/2011   Colon polyps 04/27/2011   Degenerative joint disease 04/27/2011   GERD (gastroesophageal reflux disease) 04/27/2011   HTN (hypertension) 04/27/2011   Hyperlipidemia 04/27/2011   Mild cognitive impairment 04/12/2013   Osteopenia 06/01/2012   Recurrent UTI 04/27/2011   Right carpal tunnel syndrome 07/22/2012   TIA (transient ischemic attack) 04/27/2011   Urinary incontinence 04/27/2011   Past Surgical History:  Procedure Laterality Date   BLADDER SUSPENSION     A&P REPAIR WITH MESH   BREAST BIOPSY     BREAST LUMPECTOMY     SEVERAL TIMES   BUNIONECTOMY     LEFT FOOT   CHOLECYSTECTOMY     COLONOSCOPY  4YRS AGO   IN  [place],PT DOES NOT REMEMBER DOCTOR   [facility]Bride Bunionectomy Left 09/09/2014   @ [facility] Left 09/09/2014   @ [place]   POLYPECTOMY  10 YRS AGO   BENIGN POLYPS X2   STERIOTACTIC BREAST BIOPSY     RIGHT   TENDON TRANSFER  12/09/2012   Procedure: TENDON TRANSFER;  Surgeon: Wynonia Sours, MD;  Location: [facility];  Service: Orthopedics;  Laterality: Left;  Tenodesis PIP with FDS Slip Left ring finger, [place]   TONSILLECTOMY     TOTAL ABDOMINAL HYSTERECTOMY     TRIGGER FINGER RELEASE     LEFT RING FINGER    reports that she has never smoked. She has never used smokeless tobacco. She reports that she does not drink alcohol and does not use drugs. family history includes Cancer (age of onset: 4) in her mother; Colon cancer in her paternal grandfather. Allergies  Allergen Reactions   Aspirin Anaphylaxis   Nsaids Anaphylaxis    BECAUSE OF REACTION TO VIOXX BECAUSE OF REACTION TO VIOXX   Other Anaphylaxis    BECAUSE OF REACTION TO VIOXX BECAUSE OF REACTION TO VIOXX BECAUSE OF REACTION TO VIOXX   Vioxx [Rofecoxib] Anaphylaxis   Ciprofloxacin Other (See Comments)    HEADACHE HEADACHE  Current Outpatient Medications on File Prior to Visit  Medication Sig Dispense Refill   acetaminophen (TYLENOL) 500 MG tablet Take by mouth.     Cholecalciferol (THERA-D 2000) 50 MCG (2000 UT) TABS 1 tab by mouth once daily 90 tablet 99   COVID-19 mRNA bivalent vaccine, Pfizer, (PFIZER COVID-19 VAC BIVALENT) injection Inject into the muscle. 0.3 mL 0   No current facility-administered medications on file prior to visit.        ROS:  All others reviewed and negative.  Objective        PE:  BP 126/74 (BP Location: Right Arm, Patient Position: Sitting, Cuff Size: Large)   Pulse 79   Temp 98.7 F (37.1 C) (Oral)   Ht '4\' 9"'$  (1.448 m)   Wt 94 lb (42.6 kg)   SpO2 95%   BMI 20.34 kg/m                  Constitutional: Pt appears in NAD               HENT: Head: NCAT.                Right Ear: External ear normal.                 Left Ear: External ear normal.                Eyes: . Pupils are equal, round, and reactive to light. Conjunctivae and EOM are normal               Nose: without d/c or deformity               Neck: Neck supple. Gross normal ROM               Cardiovascular: Normal rate and regular rhythm.                 Pulmonary/Chest: Effort normal and breath sounds without rales or wheezing.                Abd:  Soft, NT, ND, + BS, no organomegaly               Neurological: Pt is alert. At baseline orientation, motor grossly intact               Skin: Skin is warm. No rashes, no other new lesions, LE edema - none               Psychiatric: Pt behavior is normal without agitation   Micro: none  Cardiac tracings I have personally interpreted today:  none  Pertinent Radiological findings (summarize): none   Lab Results  Component Value Date   WBC 7.5 10/20/2021   HGB 13.0 10/20/2021   HCT 39.4 10/20/2021   PLT 123.0 (L) 10/20/2021   GLUCOSE 111 (H) 10/20/2021   CHOL 211 (H) 10/20/2021   TRIG 172.0 (H) 10/20/2021   HDL 64.80 10/20/2021   LDLDIRECT 113.9 04/08/2013   LDLCALC 112 (H) 10/20/2021   ALT 9 10/20/2021   AST 15 10/20/2021   NA 146 (H) 10/20/2021   K 3.5 10/20/2021   CL 109 10/20/2021   CREATININE 0.65 10/20/2021   BUN 22 10/20/2021   CO2 30 10/20/2021   TSH 0.98 10/20/2021   HGBA1C 5.7 01/18/2021   Assessment/Plan:  Madeline Mcdaniel is a 81 y.o. White or Caucasian [1] female with  has a past medical history of Allergic rhinitis, cause unspecified (  04/27/2011), Anemia (04/27/2011), Asthma (04/27/2011), Bipolar affective disorder ([place]) (04/27/2011), Cervical spondylosis (06/01/2012), Chronic bronchitis (04/27/2011), Chronic cystitis (04/27/2011), Colon polyps (04/27/2011), Degenerative joint disease (04/27/2011), GERD (gastroesophageal reflux disease) (04/27/2011), HTN  (hypertension) (04/27/2011), Hyperlipidemia (04/27/2011), Mild cognitive impairment (04/12/2013), Osteopenia (06/01/2012), Recurrent UTI (04/27/2011), Right carpal tunnel syndrome (07/22/2012), TIA (transient ischemic attack) (04/27/2011), and Urinary incontinence (04/27/2011).  Community acquired pneumonia of left lower lobe of lung Clinically resolved, finished antbx as rx, ok to follow, consider repeat CXR next visit  Vitamin D deficiency Last vitamin D Lab Results  Component Value Date   VD25OH 29.91 (L) 10/20/2021   Low, reminded to start oral replacement   Hyperglycemia Lab Results  Component Value Date   HGBA1C 5.7 01/18/2021   Stable, pt to continue current medical treatment  - diet, wt control, excercise   HTN (hypertension) BP Readings from Last 3 Encounters:  09/10/22 126/74  04/20/22 124/80  10/20/21 114/70   Stable, pt to continue medical treatment  - diet, wt control, excercise  Followup: Return in about 6 weeks (around 10/22/2022).  Cathlean Cower, MD 09/10/2022 2:21 PM [facility]

## 2022-09-10 NOTE — Assessment & Plan Note (Signed)
Clinically resolved, finished antbx as rx, ok to follow, consider repeat CXR next visit

## 2022-09-10 NOTE — Assessment & Plan Note (Signed)
BP Readings from Last 3 Encounters:  09/10/22 126/74  04/20/22 124/80  10/20/21 114/70   Stable, pt to continue medical treatment  - diet, wt control, excercise

## 2022-09-10 NOTE — Assessment & Plan Note (Signed)
Last vitamin D Lab Results  Component Value Date   VD25OH 29.91 (L) 10/20/2021   Low, reminded to start oral replacement

## 2022-09-10 NOTE — Assessment & Plan Note (Signed)
Lab Results  Component Value Date   HGBA1C 5.7 01/18/2021   Stable, pt to continue current medical treatment  - diet, wt control, excercise

## 2022-09-11 LAB — URINALYSIS, ROUTINE W REFLEX MICROSCOPIC
Bilirubin Urine: NEGATIVE
Hgb urine dipstick: NEGATIVE
Leukocytes,Ua: NEGATIVE
Nitrite: NEGATIVE
Specific Gravity, Urine: 1.015 (ref 1.000–1.030)
Urine Glucose: NEGATIVE
Urobilinogen, UA: 0.2 (ref 0.0–1.0)
pH: 6.5 (ref 5.0–8.0)

## 2022-10-01 DIAGNOSIS — Z961 Presence of intraocular lens: Secondary | ICD-10-CM | POA: Diagnosis not present

## 2022-10-01 DIAGNOSIS — H25812 Combined forms of age-related cataract, left eye: Secondary | ICD-10-CM | POA: Diagnosis not present

## 2022-10-01 DIAGNOSIS — H26491 Other secondary cataract, right eye: Secondary | ICD-10-CM | POA: Diagnosis not present

## 2022-10-15 ENCOUNTER — Ambulatory Visit
Admission: RE | Admit: 2022-10-15 | Discharge: 2022-10-15 | Disposition: A | Discharge status: Home / Self Care | Payer: Medicare Other | Source: Ambulatory Visit | Attending: Internal Medicine | Admitting: Internal Medicine

## 2022-10-15 DIAGNOSIS — Z1231 Encounter for screening mammogram for malignant neoplasm of breast: Secondary | ICD-10-CM

## 2022-10-18 ENCOUNTER — Other Ambulatory Visit: Payer: Self-pay | Admitting: Internal Medicine

## 2022-10-18 DIAGNOSIS — R928 Other abnormal and inconclusive findings on diagnostic imaging of breast: Secondary | ICD-10-CM

## 2022-10-22 ENCOUNTER — Ambulatory Visit (INDEPENDENT_AMBULATORY_CARE_PROVIDER_SITE_OTHER): Payer: Medicare Other | Admitting: Internal Medicine

## 2022-10-22 VITALS — BP 116/64 | HR 54 | Temp 97.7°F | Ht <= 58 in | Wt 98.0 lb

## 2022-10-22 DIAGNOSIS — R739 Hyperglycemia, unspecified: Secondary | ICD-10-CM

## 2022-10-22 DIAGNOSIS — F0390 Unspecified dementia without behavioral disturbance: Secondary | ICD-10-CM | POA: Diagnosis not present

## 2022-10-22 DIAGNOSIS — I10 Essential (primary) hypertension: Secondary | ICD-10-CM | POA: Diagnosis not present

## 2022-10-22 DIAGNOSIS — E559 Vitamin D deficiency, unspecified: Secondary | ICD-10-CM

## 2022-10-22 NOTE — Assessment & Plan Note (Signed)
Lab Results  Component Value Date   HGBA1C 6.1 09/10/2022   Stable, pt to continue current medical treatment - diet, wt control

## 2022-10-22 NOTE — Assessment & Plan Note (Signed)
With recent worsening but has supportive husband; pt declines further tx

## 2022-10-22 NOTE — Assessment & Plan Note (Signed)
BP Readings from Last 3 Encounters:  10/22/22 116/64  09/10/22 126/74  04/20/22 124/80   Stable, pt to continue medical treatment  - diet wt control

## 2022-10-22 NOTE — Progress Notes (Signed)
Patient ID: Madeline Mcdaniel, female   DOB: 04-Dec-1940, 81 y.o.   MRN: [id number]        Chief Complaint: follow up with husband       HPI:  Madeline Mcdaniel is a 81 y.o. female here overall doing quite nicely, has no complaints.  Pt denies chest pain, increased sob or doe, wheezing, orthopnea, PND, increased LE swelling, palpitations, dizziness or syncope.   Pt denies polydipsia, polyuria, or new focal neuro s/s.    Pt denies fever, wt loss, night sweats, loss of appetite, or other constitutional symptoms  Denies urinary symptoms such as dysuria, frequency, urgency, flank pain, hematuria or n/v, fever, chills.  Memory has been getting worse per husband but manageable and pt has been opposed to any tx in the past.  Not taking VIT D       Wt Readings from Last 3 Encounters:  10/22/22 98 lb (44.5 kg)  09/10/22 94 lb (42.6 kg)  04/20/22 101 lb 2 oz (45.9 kg)   BP Readings from Last 3 Encounters:  10/22/22 116/64  09/10/22 126/74  04/20/22 124/80         Past Medical History:  Diagnosis Date   Allergic rhinitis, cause unspecified 04/27/2011   Anemia 04/27/2011   Asthma 04/27/2011   Bipolar affective disorder ([place]) 04/27/2011   Cervical spondylosis 06/01/2012   Chronic bronchitis 04/27/2011   Chronic cystitis 04/27/2011   Colon polyps 04/27/2011   Degenerative joint disease 04/27/2011   GERD (gastroesophageal reflux disease) 04/27/2011   HTN (hypertension) 04/27/2011   Hyperlipidemia 04/27/2011   Mild cognitive impairment 04/12/2013   Osteopenia 06/01/2012   Recurrent UTI 04/27/2011   Right carpal tunnel syndrome 07/22/2012   TIA (transient ischemic attack) 04/27/2011   Urinary incontinence 04/27/2011   Past Surgical History:  Procedure Laterality Date   BLADDER SUSPENSION     A&P REPAIR WITH MESH   BREAST BIOPSY     BREAST LUMPECTOMY     SEVERAL TIMES   BUNIONECTOMY     LEFT FOOT   CHOLECYSTECTOMY     COLONOSCOPY  8YRS AGO   IN [place],PT DOES NOT REMEMBER DOCTOR   McBride Bunionectomy Left  09/09/2014   @ [place] Left 09/09/2014   @ [place]   POLYPECTOMY  10 YRS AGO   BENIGN POLYPS X2   STERIOTACTIC BREAST BIOPSY     RIGHT   TENDON TRANSFER  12/09/2012   Procedure: TENDON TRANSFER;  Surgeon: Wynonia Sours, MD;  Location: [facility];  Service: Orthopedics;  Laterality: Left;  Tenodesis PIP with FDS Slip Left ring finger, [place]   TONSILLECTOMY     TOTAL ABDOMINAL HYSTERECTOMY     TRIGGER FINGER RELEASE     LEFT RING FINGER    reports that she has never smoked. She has never used smokeless tobacco. She reports that she does not drink alcohol and does not use drugs. family history includes Cancer (age of onset: 66) in her mother; Colon cancer in her paternal grandfather. Allergies  Allergen Reactions   Aspirin Anaphylaxis   Nsaids Anaphylaxis    BECAUSE OF REACTION TO VIOXX BECAUSE OF REACTION TO VIOXX   Other Anaphylaxis    BECAUSE OF REACTION TO VIOXX BECAUSE OF REACTION TO VIOXX BECAUSE OF REACTION TO VIOXX   Vioxx [Rofecoxib] Anaphylaxis   Ciprofloxacin Other (See Comments)    HEADACHE HEADACHE   Current Outpatient Medications on  File Prior to Visit  Medication Sig Dispense Refill   acetaminophen (TYLENOL) 500 MG tablet Take by mouth.     Cholecalciferol (THERA-D 2000) 50 MCG (2000 UT) TABS 1 tab by mouth once daily 90 tablet 99   potassium chloride (KLOR-CON 10) 10 MEQ tablet Take 1 tablet (10 mEq total) by mouth daily. 5 tablet 0   No current facility-administered medications on file prior to visit.        ROS:  All others reviewed and negative.  Objective        PE:  BP 116/64 (BP Location: Right Arm, Patient Position: Sitting, Cuff Size: Normal)   Pulse (!) 54   Temp 97.7 F (36.5 C) (Oral)   Ht '4\' 9"'$  (1.448 m)   Wt 98 lb (44.5 kg)   SpO2 99%   BMI 21.21 kg/m                 Constitutional: Pt appears in NAD               HENT: Head: NCAT.                 Right Ear: External ear normal.                 Left Ear: External ear normal.                Eyes: . Pupils are equal, round, and reactive to light. Conjunctivae and EOM are normal               Nose: without d/c or deformity               Neck: Neck supple. Gross normal ROM               Cardiovascular: Normal rate and regular rhythm.                 Pulmonary/Chest: Effort normal and breath sounds without rales or wheezing.                Abd:  Soft, NT, ND, + BS, no organomegaly               Neurological: Pt is alert. At baseline orientation, motor grossly intact               Skin: Skin is warm. No rashes, no other new lesions, LE edema - none               Psychiatric: Pt behavior is normal without agitation   Micro: none  Cardiac tracings I have personally interpreted today:  none  Pertinent Radiological findings (summarize): none   Lab Results  Component Value Date   WBC 10.6 (H) 09/10/2022   HGB 15.0 09/10/2022   HCT 43.8 09/10/2022   PLT 158.0 09/10/2022   GLUCOSE 120 (H) 09/10/2022   CHOL 161 09/10/2022   TRIG 165.0 (H) 09/10/2022   HDL 58.10 09/10/2022   LDLDIRECT 113.9 04/08/2013   LDLCALC 70 09/10/2022   ALT 17 09/10/2022   AST 22 09/10/2022   NA 141 09/10/2022   K 2.9 (L) 09/10/2022   CL 97 09/10/2022   CREATININE 0.81 09/10/2022   BUN 14 09/10/2022   CO2 30 09/10/2022   TSH 1.71 09/10/2022   HGBA1C 6.1 09/10/2022   Assessment/Plan:  Madeline Mcdaniel is a 81 y.o. White or Caucasian [1] female with  has a past medical history of Allergic rhinitis, cause unspecified (04/27/2011), Anemia (04/27/2011),  Asthma (04/27/2011), Bipolar affective disorder ([place]) (04/27/2011), Cervical spondylosis (06/01/2012), Chronic bronchitis (04/27/2011), Chronic cystitis (04/27/2011), Colon polyps (04/27/2011), Degenerative joint disease (04/27/2011), GERD (gastroesophageal reflux disease) (04/27/2011), HTN (hypertension) (04/27/2011), Hyperlipidemia (04/27/2011), Mild cognitive impairment  (04/12/2013), Osteopenia (06/01/2012), Recurrent UTI (04/27/2011), Right carpal tunnel syndrome (07/22/2012), TIA (transient ischemic attack) (04/27/2011), and Urinary incontinence (04/27/2011).  Vitamin D deficiency Last vitamin D Lab Results  Component Value Date   VD25OH 34.33 09/10/2022   Low, reminded to start oral replacement   HTN (hypertension) BP Readings from Last 3 Encounters:  10/22/22 116/64  09/10/22 126/74  04/20/22 124/80   Stable, pt to continue medical treatment  - diet wt control   Hyperglycemia Lab Results  Component Value Date   HGBA1C 6.1 09/10/2022   Stable, pt to continue current medical treatment - diet, wt control   Dementia without behavioral disturbance ([place]) With recent worsening but has supportive husband; pt declines further tx  Followup: Return in about 4 months (around 02/20/2023).  Cathlean Cower, MD 10/22/2022 2:54 PM [facility]

## 2022-10-22 NOTE — Assessment & Plan Note (Signed)
Last vitamin D Lab Results  Component Value Date   VD25OH 34.33 09/10/2022   Low, reminded to start oral replacement

## 2022-10-22 NOTE — Patient Instructions (Signed)
Please continue all other medications as before, and refills have been done if requested.  Please have the pharmacy call with any other refills you may need.  Please continue your efforts at being more active, low cholesterol diet, and weight control.  You are otherwise up to date with prevention measures today.  Please keep your appointments with your specialists as you may have planned  Please make an Appointment to return in 4 months, or sooner if needed

## 2022-11-06 ENCOUNTER — Ambulatory Visit
Admission: RE | Admit: 2022-11-06 | Discharge: 2022-11-06 | Disposition: A | Discharge status: Home / Self Care | Payer: Medicare Other | Source: Ambulatory Visit | Attending: Internal Medicine | Admitting: Internal Medicine

## 2022-11-06 ENCOUNTER — Ambulatory Visit: Admission: RE | Admit: 2022-11-06 | Payer: Medicare Other | Source: Ambulatory Visit

## 2022-11-06 DIAGNOSIS — R928 Other abnormal and inconclusive findings on diagnostic imaging of breast: Secondary | ICD-10-CM | POA: Diagnosis not present

## 2022-11-06 DIAGNOSIS — N6489 Other specified disorders of breast: Secondary | ICD-10-CM | POA: Diagnosis not present

## 2022-11-21 DIAGNOSIS — N3941 Urge incontinence: Secondary | ICD-10-CM | POA: Diagnosis not present

## 2022-11-21 DIAGNOSIS — Z87440 Personal history of urinary (tract) infections: Secondary | ICD-10-CM | POA: Diagnosis not present

## 2022-11-29 DIAGNOSIS — Z23 Encounter for immunization: Secondary | ICD-10-CM | POA: Diagnosis not present

## 2023-02-18 ENCOUNTER — Encounter: Payer: Self-pay | Admitting: Internal Medicine

## 2023-02-18 ENCOUNTER — Ambulatory Visit (INDEPENDENT_AMBULATORY_CARE_PROVIDER_SITE_OTHER): Payer: Medicare Other | Admitting: Internal Medicine

## 2023-02-18 VITALS — BP 118/68 | HR 63 | Temp 98.1°F | Ht <= 58 in | Wt 102.0 lb

## 2023-02-18 DIAGNOSIS — I10 Essential (primary) hypertension: Secondary | ICD-10-CM | POA: Diagnosis not present

## 2023-02-18 DIAGNOSIS — E7800 Pure hypercholesterolemia, unspecified: Secondary | ICD-10-CM

## 2023-02-18 DIAGNOSIS — E559 Vitamin D deficiency, unspecified: Secondary | ICD-10-CM | POA: Diagnosis not present

## 2023-02-18 DIAGNOSIS — E876 Hypokalemia: Secondary | ICD-10-CM

## 2023-02-18 DIAGNOSIS — R739 Hyperglycemia, unspecified: Secondary | ICD-10-CM | POA: Diagnosis not present

## 2023-02-18 DIAGNOSIS — F0390 Unspecified dementia without behavioral disturbance: Secondary | ICD-10-CM | POA: Diagnosis not present

## 2023-02-18 LAB — CBC WITH DIFFERENTIAL/PLATELET
Basophils Absolute: 0 10*3/uL (ref 0.0–0.1)
Basophils Relative: 0.3 % (ref 0.0–3.0)
Eosinophils Absolute: 0 10*3/uL (ref 0.0–0.7)
Eosinophils Relative: 0.6 % (ref 0.0–5.0)
HCT: 41.1 % (ref 36.0–46.0)
Hemoglobin: 13.7 g/dL (ref 12.0–15.0)
Lymphocytes Relative: 33.6 % (ref 12.0–46.0)
Lymphs Abs: 2.3 10*3/uL (ref 0.7–4.0)
MCHC: 33.2 g/dL (ref 30.0–36.0)
MCV: 85.8 fl (ref 78.0–100.0)
Monocytes Absolute: 0.6 10*3/uL (ref 0.1–1.0)
Monocytes Relative: 8.6 % (ref 3.0–12.0)
Neutro Abs: 4 10*3/uL (ref 1.4–7.7)
Neutrophils Relative %: 56.9 % (ref 43.0–77.0)
Platelets: 90 10*3/uL — ABNORMAL LOW (ref 150.0–400.0)
RBC: 4.8 Mil/uL (ref 3.87–5.11)
RDW: 13.4 % (ref 11.5–15.5)
WBC: 7 10*3/uL (ref 4.0–10.5)

## 2023-02-18 LAB — LIPID PANEL
Cholesterol: 209 mg/dL — ABNORMAL HIGH (ref 0–200)
HDL: 67.2 mg/dL (ref >39.00)
NonHDL: 141.45
Total CHOL/HDL Ratio: 3
Triglycerides: 223 mg/dL — ABNORMAL HIGH (ref 0.0–149.0)
VLDL: 44.6 mg/dL — ABNORMAL HIGH (ref 0.0–40.0)

## 2023-02-18 LAB — VITAMIN D 25 HYDROXY (VIT D DEFICIENCY, FRACTURES): VITD: 21.94 ng/mL — ABNORMAL LOW (ref 30.00–100.00)

## 2023-02-18 LAB — BASIC METABOLIC PANEL
BUN: 13 mg/dL (ref 6–23)
CO2: 28 mEq/L (ref 19–32)
Calcium: 9.4 mg/dL (ref 8.4–10.5)
Chloride: 106 mEq/L (ref 96–112)
Creatinine, Ser: 0.68 mg/dL (ref 0.40–1.20)
GFR: 80.64 mL/min (ref >60.00)
Glucose, Bld: 98 mg/dL (ref 70–99)
Potassium: 2.9 mEq/L — ABNORMAL LOW (ref 3.5–5.1)
Sodium: 143 mEq/L (ref 135–145)

## 2023-02-18 LAB — HEMOGLOBIN A1C: Hgb A1c MFr Bld: 5.8 % (ref 4.6–6.5)

## 2023-02-18 LAB — HEPATIC FUNCTION PANEL
ALT: 7 U/L (ref 0–35)
AST: 14 U/L (ref 0–37)
Albumin: 4 g/dL (ref 3.5–5.2)
Alkaline Phosphatase: 45 U/L (ref 39–117)
Bilirubin, Direct: 0.2 mg/dL (ref 0.0–0.3)
Total Bilirubin: 1.2 mg/dL (ref 0.2–1.2)
Total Protein: 6.8 g/dL (ref 6.0–8.3)

## 2023-02-18 LAB — MAGNESIUM: Magnesium: 2 mg/dL (ref 1.5–2.5)

## 2023-02-18 LAB — LDL CHOLESTEROL, DIRECT: Direct LDL: 113 mg/dL

## 2023-02-18 NOTE — Assessment & Plan Note (Signed)
Last vitamin D Lab Results  Component Value Date   VD25OH 34.33 09/10/2022   Low, to start oral replacement

## 2023-02-18 NOTE — Assessment & Plan Note (Signed)
Lab Results  Component Value Date   HGBA1C 6.1 09/10/2022   Stable, pt to continue current medical treatment - diet, wt control  

## 2023-02-18 NOTE — Patient Instructions (Signed)

## 2023-02-18 NOTE — Assessment & Plan Note (Signed)
Overall stable mod to severe without behavioral changes, pt declines aricept

## 2023-02-18 NOTE — Assessment & Plan Note (Signed)
Lab Results  Component Value Date   LDLCALC 70 09/10/2022   Stable, pt to continue current statin - low chol diet

## 2023-02-18 NOTE — Assessment & Plan Note (Signed)
BP Readings from Last 3 Encounters:  02/18/23 118/68  10/22/22 116/64  09/10/22 126/74   Stable, pt to continue medical treatment  - diet, wt control

## 2023-02-18 NOTE — Progress Notes (Signed)
Patient ID: Madeline Mcdaniel, female   DOB: August 07, 1941, 82 y.o.   MRN: [id number]        Chief Complaint: follow up HTN, HLD and hyperglycemia , low vit d, low potassium, left great toenail mild ingrown, dementia       HPI:  Madeline Mcdaniel is a 82 y.o. female here with husband for support as pt unable for hx due to dementia, still refuses to take any oral medication of any kind.  Did take the 5 days potassium after last visit.  Not taking vit d  Pt denies chest pain, increased sob or doe, wheezing, orthopnea, PND, increased LE swelling, palpitations, dizziness or syncope.   Pt denies polydipsia, polyuria, or new focal neuro s/s.    Pt denies fever, wt loss, night sweats, loss of appetite, or other constitutional symptoms  Dementia overall stable symptomatically, and not assoc with behavioral changes such as hallucinations, paranoia, or agitation.       Wt Readings from Last 3 Encounters:  02/18/23 102 lb (46.3 kg)  10/22/22 98 lb (44.5 kg)  09/10/22 94 lb (42.6 kg)   BP Readings from Last 3 Encounters:  02/18/23 118/68  10/22/22 116/64  09/10/22 126/74         Past Medical History:  Diagnosis Date   Allergic rhinitis, cause unspecified 04/27/2011   Anemia 04/27/2011   Asthma 04/27/2011   Bipolar affective disorder ([place]) 04/27/2011   Cervical spondylosis 06/01/2012   Chronic bronchitis 04/27/2011   Chronic cystitis 04/27/2011   Colon polyps 04/27/2011   Degenerative joint disease 04/27/2011   GERD (gastroesophageal reflux disease) 04/27/2011   HTN (hypertension) 04/27/2011   Hyperlipidemia 04/27/2011   Mild cognitive impairment 04/12/2013   Osteopenia 06/01/2012   Recurrent UTI 04/27/2011   Right carpal tunnel syndrome 07/22/2012   TIA (transient ischemic attack) 04/27/2011   Urinary incontinence 04/27/2011   Past Surgical History:  Procedure Laterality Date   BLADDER SUSPENSION     A&P REPAIR WITH MESH   BREAST BIOPSY     BREAST LUMPECTOMY     SEVERAL TIMES   BUNIONECTOMY     LEFT FOOT    CHOLECYSTECTOMY     COLONOSCOPY  42YRS AGO   IN [place],PT DOES NOT REMEMBER DOCTOR   [facility] Bunionectomy Left 09/09/2014   @ [facility] Left 09/09/2014   @ [facility]   POLYPECTOMY  10 YRS AGO   BENIGN POLYPS X2   STERIOTACTIC BREAST BIOPSY     RIGHT   TENDON TRANSFER  12/09/2012   Procedure: TENDON TRANSFER;  Surgeon: Wynonia Sours, MD;  Location: [facility];  Service: Orthopedics;  Laterality: Left;  Tenodesis PIP with FDS Slip Left ring finger, Juggerknot   TONSILLECTOMY     TOTAL ABDOMINAL HYSTERECTOMY     TRIGGER FINGER RELEASE     LEFT RING FINGER    reports that she has never smoked. She has never used smokeless tobacco. She reports that she does not drink alcohol and does not use drugs. family history includes Cancer (age of onset: 45) in her mother; Colon cancer in her paternal grandfather. Allergies  Allergen Reactions   Aspirin Anaphylaxis   Nsaids Anaphylaxis    BECAUSE OF REACTION TO VIOXX BECAUSE OF REACTION TO VIOXX   Other Anaphylaxis    BECAUSE OF REACTION TO VIOXX BECAUSE OF REACTION TO VIOXX BECAUSE OF REACTION TO VIOXX   Vioxx [Rofecoxib] Anaphylaxis  Ciprofloxacin Other (See Comments)    HEADACHE HEADACHE   Current Outpatient Medications on File Prior to Visit  Medication Sig Dispense Refill   acetaminophen (TYLENOL) 500 MG tablet Take by mouth.     Cholecalciferol (THERA-D 2000) 50 MCG (2000 UT) TABS 1 tab by mouth once daily (Patient not taking: Reported on 02/18/2023) 90 tablet 99   potassium chloride (KLOR-CON 10) 10 MEQ tablet Take 1 tablet (10 mEq total) by mouth daily. (Patient not taking: Reported on 02/18/2023) 5 tablet 0   No current facility-administered medications on file prior to visit.        ROS:  All others reviewed and negative.  Objective        PE:  BP 118/68 (BP Location: Right Arm, Patient Position: Sitting, Cuff Size: Normal)   Pulse 63   Temp 98.1  F (36.7 C) (Oral)   Ht 4\' 9"  (1.448 m)   Wt 102 lb (46.3 kg)   SpO2 97%   BMI 22.07 kg/m                 Constitutional: Pt appears in NAD               HENT: Head: NCAT.                Right Ear: External ear normal.                 Left Ear: External ear normal.                Eyes: . Pupils are equal, round, and reactive to light. Conjunctivae and EOM are normal               Nose: without d/c or deformity               Neck: Neck supple. Gross normal ROM               Cardiovascular: Normal rate and regular rhythm.                 Pulmonary/Chest: Effort normal and breath sounds without rales or wheezing.                Abd:  Soft, NT, ND, + BS, no organomegaly               Neurological: Pt is alert. At baseline orientation, motor grossly intact               Skin: Skin is warm. No rashes, no other new lesions, LE edema - none               Psychiatric: Pt behavior is normal without agitation   Micro: none  Cardiac tracings I have personally interpreted today:  none  Pertinent Radiological findings (summarize): none   Lab Results  Component Value Date   WBC 10.6 (H) 09/10/2022   HGB 15.0 09/10/2022   HCT 43.8 09/10/2022   PLT 158.0 09/10/2022   GLUCOSE 120 (H) 09/10/2022   CHOL 161 09/10/2022   TRIG 165.0 (H) 09/10/2022   HDL 58.10 09/10/2022   LDLDIRECT 113.9 04/08/2013   LDLCALC 70 09/10/2022   ALT 17 09/10/2022   AST 22 09/10/2022   NA 141 09/10/2022   K 2.9 (L) 09/10/2022   CL 97 09/10/2022   CREATININE 0.81 09/10/2022   BUN 14 09/10/2022   CO2 30 09/10/2022   TSH 1.71 09/10/2022   HGBA1C 6.1 09/10/2022   Assessment/Plan:  Madeline Mcdaniel  A Tarr is a 82 y.o. White or Caucasian [1] female with  has a past medical history of Allergic rhinitis, cause unspecified (04/27/2011), Anemia (04/27/2011), Asthma (04/27/2011), Bipolar affective disorder ([place]) (04/27/2011), Cervical spondylosis (06/01/2012), Chronic bronchitis (04/27/2011), Chronic cystitis (04/27/2011), Colon polyps  (04/27/2011), Degenerative joint disease (04/27/2011), GERD (gastroesophageal reflux disease) (04/27/2011), HTN (hypertension) (04/27/2011), Hyperlipidemia (04/27/2011), Mild cognitive impairment (04/12/2013), Osteopenia (06/01/2012), Recurrent UTI (04/27/2011), Right carpal tunnel syndrome (07/22/2012), TIA (transient ischemic attack) (04/27/2011), and Urinary incontinence (04/27/2011).  Dementia without behavioral disturbance (HCC) Overall stable mod to severe without behavioral changes, pt declines aricept  HTN (hypertension) BP Readings from Last 3 Encounters:  02/18/23 118/68  10/22/22 116/64  09/10/22 126/74   Stable, pt to continue medical treatment  - diet, wt control   Hyperglycemia Lab Results  Component Value Date   HGBA1C 6.1 09/10/2022   Stable, pt to continue current medical treatment  - diet, wt control   Hyperlipidemia Lab Results  Component Value Date   LDLCALC 70 09/10/2022   Stable, pt to continue current statin - low chol diet   Vitamin D deficiency Last vitamin D Lab Results  Component Value Date   VD25OH 34.33 09/10/2022   Low, to start oral replacement  Followup: Return in about 6 months (around 08/21/2023).  Cathlean Cower, MD 02/18/2023 1:33 PM [facility]

## 2023-05-15 ENCOUNTER — Ambulatory Visit (INDEPENDENT_AMBULATORY_CARE_PROVIDER_SITE_OTHER): Payer: Medicare Other

## 2023-05-15 DIAGNOSIS — Z0000 Encounter for general adult medical examination without abnormal findings: Secondary | ICD-10-CM | POA: Diagnosis not present

## 2023-05-15 NOTE — Progress Notes (Signed)
Subjective:   Madeline Mcdaniel is a 82 y.o. female who presents for Medicare Annual (Subsequent) preventive examination.  I connected with  Madeline Mcdaniel on 05/15/23 by a audio enabled telemedicine application and verified that I am speaking with the correct person using two identifiers.  Patient Location: Home  Provider Location: Office/Clinic  I discussed the limitations of evaluation and management by telemedicine. The patient expressed understanding and agreed to proceed.  Review of Systems    Defer to PCP  Cardiac Risk Factors include: advanced age (>70men, >64 women);hypertension     Objective:    There were no vitals filed for this visit. There is no height or weight on file to calculate BMI.     05/15/2023    4:18 PM 03/30/2022    2:12 PM 09/18/2021    3:12 PM 03/22/2021    1:08 PM 01/31/2021    8:48 AM 12/23/2020    9:23 AM 12/16/2017    1:44 PM  Advanced Directives  Does Patient Have a Medical Advance Directive? Yes Yes Yes Yes Yes Yes Yes  Type of Advance Directive Living will Healthcare Power of [place];Living will  Living will;Healthcare Power of [employer] Power of [place];Living will Healthcare Power of [place];Living will  Does patient want to make changes to medical advance directive?    No - Patient declined No - Patient declined  No - Patient declined  Copy of Healthcare Power of Attorney in Chart?  No - copy requested  No - copy requested   No - copy requested    Current Medications (verified) Outpatient Encounter Medications as of 05/15/2023  Medication Sig   acetaminophen (TYLENOL) 500 MG tablet Take by mouth. (Patient not taking: Reported on 05/15/2023)   Cholecalciferol (THERA-D 2000) 50 MCG (2000 UT) TABS 1 tab by mouth once daily (Patient not taking: Reported on 05/15/2023)   potassium chloride (KLOR-CON 10) 10 MEQ tablet Take 1 tablet (10 mEq total) by mouth daily. (Patient not taking: Reported on 05/15/2023)   No facility-administered encounter  medications on file as of 05/15/2023.    Allergies (verified) Aspirin, Nsaids, Other, Vioxx [rofecoxib], and Ciprofloxacin   History: Past Medical History:  Diagnosis Date   Allergic rhinitis, cause unspecified 04/27/2011   Anemia 04/27/2011   Asthma 04/27/2011   Bipolar affective disorder (HCC) 04/27/2011   Cervical spondylosis 06/01/2012   Chronic bronchitis 04/27/2011   Chronic cystitis 04/27/2011   Colon polyps 04/27/2011   Degenerative joint disease 04/27/2011   GERD (gastroesophageal reflux disease) 04/27/2011   HTN (hypertension) 04/27/2011   Hyperlipidemia 04/27/2011   Mild cognitive impairment 04/12/2013   Osteopenia 06/01/2012   Recurrent UTI 04/27/2011   Right carpal tunnel syndrome 07/22/2012   TIA (transient ischemic attack) 04/27/2011   Urinary incontinence 04/27/2011   Past Surgical History:  Procedure Laterality Date   BLADDER SUSPENSION     A&P REPAIR WITH MESH   BREAST BIOPSY     BREAST LUMPECTOMY     SEVERAL TIMES   BUNIONECTOMY     LEFT FOOT   CHOLECYSTECTOMY     COLONOSCOPY  62YRS AGO   IN [place],PT DOES NOT REMEMBER DOCTOR   Madeline Mcdaniel Bunionectomy Left 09/09/2014   @ [facility]   NEURECTOMY FOOT Left 09/09/2014   @ [facility]   ORTHOSCOPIC CARPETOMY     BOTH THUMBS   POLYPECTOMY  10 YRS AGO   BENIGN POLYPS X2   STERIOTACTIC BREAST BIOPSY     RIGHT   TENDON TRANSFER  12/09/2012   Procedure: TENDON TRANSFER;  Surgeon: Nicki Reaper, MD;  Location: [facility];  Service: Orthopedics;  Laterality: Left;  Tenodesis PIP with FDS Slip Left ring finger, [place]ug[place]knot   TONSILLECTOMY     TOTAL ABDOMINAL HYSTERECTOMY     TRIGGER FINGER RELEASE     LEFT RING FINGER   Family History  Problem Relation Age of Onset   Cancer Mother 52       RENAL CELL CARCINOMA   Colon cancer Paternal Grandfather    Breast cancer Neg Hx    Social History   Socioeconomic History   Marital status: Married    Spouse name: Madeline Mcdaniel   Number of children: 2   Years  of education: Not on file   Highest education level: Not on file  Occupational History   Not on file  Tobacco Use   Smoking status: Never   Smokeless tobacco: Never  Vaping Use   Vaping Use: Never used  Substance and Sexual Activity   Alcohol use: No   Drug use: No   Sexual activity: Not Currently  Other Topics Concern   Not on file  Social History Narrative   Not on file   Social Determinants of Health   Financial Resource Strain: Low Risk  (05/15/2023)   Overall Financial Resource Strain (CARDIA)    Difficulty of Paying Living Expenses: Not hard at all  Food Insecurity: No Food Insecurity (05/15/2023)   Hunger Vital Sign    Worried About Running Out of Food in the Last Year: Never true    Ran Out of Food in the Last Year: Never true  Transportation Needs: No Transportation Needs (05/15/2023)   PRAPARE - Administrator, Civil Service (Medical): No    Lack of Transportation (Non-Medical): No  Physical Activity: Inactive (05/15/2023)   Exercise Vital Sign    Days of Exercise per Week: 0 days    Minutes of Exercise per Session: 0 min  Stress: No Stress Concern Present (03/30/2022)   [employer] of Occupational Health - Occupational Stress Questionnaire    Feeling of Stress : Not at all  Social Connections: Moderately Isolated (05/15/2023)   Social Connection and Isolation Panel [NHANES]    Frequency of Communication with Friends and Family: Three times a week    Frequency of Social Gatherings with Friends and Family: Three times a week    Attends Religious Services: Never    Active Member of Clubs or Organizations: No    Attends Engineer, structural: Never    Marital Status: Married    Tobacco Counseling Counseling given: Not Answered   Clinical Intake:  Pre-visit preparation completed: Yes  Pain : No/denies pain     BMI - recorded: 22.07 Nutritional Status: BMI of 19-24  Normal Nutritional Risks: None Diabetes: No  How often do you  need to have someone help you when you read instructions, pamphlets, or other written materials from your doctor or pharmacy?: 5 - Always What is the last grade level you completed in school?: Either Associate or Bachelors( RN degree)  Diabetic?No  Interpreter Needed?: No      Activities of Daily Living    05/15/2023    4:22 PM  In your present state of health, do you have any difficulty performing the following activities:  Hearing? 0  Vision? 0  Difficulty concentrating or making decisions? 1  Comment Husband dose everything  Walking or climbing stairs? 1  Dressing or bathing? 1  Comment Husband dose everything  Doing errands, shopping? 1  Comment Husband dose everything  Preparing Food and eating ? Y  Comment Husband dose everything  Using the Toilet? Y  Comment Husband dose everything  In the past six months, have you accidently leaked urine? Y  Comment Husband dose everything  Do you have problems with loss of bowel control? N  Managing your Medications? Y  Comment Husband dose everything  Managing your Finances? Y  Comment Husband dose everything  Housekeeping or managing your Housekeeping? Y  Comment Husband dose everything    Patient Care Team: Corwin Levins, MD as PCP - General (Internal Medicine)  Indicate any recent Medical Services you may have received from other than [facility] providers in the past year (date may be approximate).     Assessment:   This is a routine wellness examination for [place].  Hearing/Vision screen Vision Screening - Comments:: Sees eye doctor every year , Dr. Unk Pinto   Dietary issues and exercise activities discussed: Current Exercise Habits: Home exercise routine, Type of exercise: walking, Time (Minutes): 10, Frequency (Times/Week): 3, Weekly Exercise (Minutes/Week): 30, Intensity: Mild, Exercise limited by: None identified   Goals Addressed               This Visit's Progress     Patient Stated (pt-stated)        To improve  from current health issue       Depression Screen    05/15/2023    4:21 PM 05/15/2023    4:17 PM 02/18/2023    1:08 PM 10/22/2022    2:08 PM 09/10/2022    1:15 PM 09/10/2022    1:03 PM 04/20/2022    2:24 PM  PHQ 2/9 Scores  PHQ - 2 Score 0 0 2 0 0  0  PHQ- 9 Score 0  8 0     Exception Documentation      Patient refusal     Fall Risk    05/15/2023    4:21 PM 02/18/2023    1:05 PM 10/22/2022    2:09 PM 09/10/2022    1:15 PM 09/10/2022    1:03 PM  Fall Risk   Falls in the past year? 0 0 0 0 0  Number falls in past yr: 0 0  0   Injury with Fall? 0 0  0   Risk for fall due to : No Fall Risks No Fall Risks No Fall Risks  No Fall Risks  Risk for fall due to: Comment with wheecahir      Follow up Falls evaluation completed Falls evaluation completed Falls evaluation completed  Falls evaluation completed    FALL RISK PREVENTION PERTAINING TO THE HOME:  Any stairs in or around the home? Yes  If so, are there any without handrails? Yes  Home free of loose throw rugs in walkways, pet beds, electrical cords, etc? Yes  Adequate lighting in your home to reduce risk of falls? Yes   ASSISTIVE DEVICES UTILIZED TO PREVENT FALLS:  Life alert? No  Use of a cane, walker or w/c? Yes  Grab bars in the bathroom? No  Shower chair or bench in shower? Yes  Elevated toilet seat or a handicapped toilet? No   TIMED UP AND GO:  Was the test performed? No .  Length of time to ambulate 10 feet: N/A sec.    Cognitive Function:    09/18/2021    3:00 PM 01/30/2016    2:57 PM 01/30/2016  2:48 PM  MMSE - Mini Mental State Exam  Orientation to time 0 5 5  Orientation to Place 0 5 5  Registration 3  3  Attention/ Calculation 0 3 3  Recall 0 3 3  Language- name 2 objects 2 2 2   Language- repeat 1 1 1   Language- follow 3 step command 3 3 1   Language- read & follow direction 1 1 1   Write a sentence 1 1 1   Copy design 0 1 1  Total score 11  26      11/19/2018   11:00 AM 10/22/2017   11:00 AM  04/10/2017    2:00 PM  Montreal Cognitive Assessment   Visuospatial/ Executive (0/5) 1 5 5   Naming (0/3) 2 3 3   Attention: Read list of digits (0/2) 2 2 2   Attention: Read list of letters (0/1) 0 1 1  Attention: Serial 7 subtraction starting at 100 (0/3) 0 2 1  Language: Repeat phrase (0/2) 1 2 1   Language : Fluency (0/1) 0 0 0  Abstraction (0/2) 1 2 2   Delayed Recall (0/5) 0 4 2  Orientation (0/6) 6 6 6   Total 13 27 23       05/15/2023    4:26 PM  6CIT Screen  What Year? 4 points  What month? 3 points  What time? 3 points  Count back from 20 4 points  Months in reverse 4 points  Repeat phrase 10 points  Total Score 28 points    Immunizations Immunization History  Administered Date(s) Administered   DTaP 09/18/2009   Fluad Quad(high Dose 65+) 08/07/2019, 09/10/2022   Influenza Split 08/30/2011, 09/16/2012   Influenza, High Dose Seasonal PF 07/31/2017, 08/07/2018, 08/22/2020   Influenza,inj,Quad PF,6+ Mos 08/25/2013, 08/25/2014, 08/16/2015, 07/31/2016   Influenza-Unspecified 09/25/2021   PFIZER(Purple Top)SARS-COV-2 Vaccination 12/15/2019, 01/04/2020, 08/31/2020, 03/31/2021, 09/25/2021   Pfizer Covid-19 Vaccine Bivalent Booster 44yrs & up 04/20/2022   Pneumococcal Conjugate-13 12/03/2006, 10/01/2013   Pneumococcal Polysaccharide-23 12/03/2009   Tetanus 04/08/2013   Zoster Recombinat (Shingrix) 08/13/2017, 02/07/2018, 02/07/2018   Zoster, Live 12/03/2009    TDAP status: Due, Education has been provided regarding the importance of this vaccine. Advised may receive this vaccine at local pharmacy or Health Dept. Aware to provide a copy of the vaccination record if obtained from local pharmacy or Health Dept. Verbalized acceptance and understanding.  Flu Vaccine status: Up to date  Pneumococcal vaccine status: Up to date  Covid-19 vaccine status: Completed vaccines  Qualifies for Shingles Vaccine? Yes   Zostavax completed Yes   Shingrix Completed?: Yes  Screening  Tests Health Maintenance  Topic Date Due   COVID-19 Vaccine (7 - 2023-24 season) 08/03/2022   DTaP/Tdap/Td (3 - Tdap) 04/09/2023   INFLUENZA VACCINE  07/04/2023   Medicare Annual Wellness (AWV)  05/14/2024   Pneumonia Vaccine 69+ Years old  Completed   DEXA SCAN  Completed   Zoster Vaccines- Shingrix  Completed   HPV VACCINES  Aged Out    Health Maintenance  Health Maintenance Due  Topic Date Due   COVID-19 Vaccine (7 - 2023-24 season) 08/03/2022   DTaP/Tdap/Td (3 - Tdap) 04/09/2023    Colorectal cancer screening: No longer required.   Mammogram status: Completed 11/06/22. Repeat every year  Bone Density status: Completed 01/30/16. Results reflect: Bone density results: OSTEOPOROSIS. Repeat every N/A years.  Lung Cancer Screening: (Low Dose CT Chest recommended if Age 14-80 years, 30 pack-year currently smoking OR have quit w/in 15years.) does not qualify.   Lung Cancer Screening  Referral: N/A  Additional Screening:  Hepatitis C Screening: does not qualify; Completed N/A  Vision Screening: Recommended annual ophthalmology exams for early detection of glaucoma and other disorders of the eye. Is the patient up to date with their annual eye exam?  Yes  Who is the provider or what is the name of the office in which the patient attends annual eye exams? Dr. Unk Pinto If pt is not established with a provider, would they like to be referred to a provider to establish care? No .   Dental Screening: Recommended annual dental exams for proper oral hygiene  Community Resource Referral / Chronic Care Management: CRR required this visit?  No   CCM required this visit?  No      Plan:     I have personally reviewed and noted the following in the patient's chart:   Medical and social history Use of alcohol, tobacco or illicit drugs  Current medications and supplements including opioid prescriptions. Patient is not currently taking opioid prescriptions. Functional ability and  status Nutritional status Physical activity Advanced directives List of other physicians Hospitalizations, surgeries, and ER visits in previous 12 months Vitals Screenings to include cognitive, depression, and falls Referrals and appointments  In addition, I have reviewed and discussed with patient certain preventive protocols, quality metrics, and best practice recommendations. A written personalized care plan for preventive services as well as general preventive health recommendations were provided to patient.     Ferdie Ping, CMA   05/15/2023   Nurse Notes: Non face to face, 20 min.  Ms. Mastrangelo , Thank you for taking time to come for your Medicare Wellness Visit. I appreciate your ongoing commitment to your health goals. Please review the following plan we discussed and let me know if I can assist you in the future.   These are the goals we discussed:  Goals       maintain health (pt-stated)      Would like to maintain memory and health  Staying engaged;       Patient Stated (pt-stated)      To improve from current health issue         This is a list of the screening recommended for you and due dates:  Health Maintenance  Topic Date Due   COVID-19 Vaccine (7 - 2023-24 season) 08/03/2022   DTaP/Tdap/Td vaccine (3 - Tdap) 04/09/2023   Flu Shot  07/04/2023   Medicare Annual Wellness Visit  05/14/2024   Pneumonia Vaccine  Completed   DEXA scan (bone density measurement)  Completed   Zoster (Shingles) Vaccine  Completed   HPV Vaccine  Aged Out

## 2023-05-30 DIAGNOSIS — L578 Other skin changes due to chronic exposure to nonionizing radiation: Secondary | ICD-10-CM | POA: Diagnosis not present

## 2023-05-30 DIAGNOSIS — D1801 Hemangioma of skin and subcutaneous tissue: Secondary | ICD-10-CM | POA: Diagnosis not present

## 2023-05-30 DIAGNOSIS — L821 Other seborrheic keratosis: Secondary | ICD-10-CM | POA: Diagnosis not present

## 2023-07-18 ENCOUNTER — Encounter (INDEPENDENT_AMBULATORY_CARE_PROVIDER_SITE_OTHER): Payer: Self-pay

## 2023-08-21 ENCOUNTER — Ambulatory Visit (INDEPENDENT_AMBULATORY_CARE_PROVIDER_SITE_OTHER): Payer: Medicare Other | Admitting: Internal Medicine

## 2023-08-21 ENCOUNTER — Encounter: Payer: Self-pay | Admitting: Internal Medicine

## 2023-08-21 VITALS — BP 120/70 | HR 57 | Temp 98.1°F | Ht <= 58 in | Wt 100.0 lb

## 2023-08-21 DIAGNOSIS — E7800 Pure hypercholesterolemia, unspecified: Secondary | ICD-10-CM | POA: Diagnosis not present

## 2023-08-21 DIAGNOSIS — R109 Unspecified abdominal pain: Secondary | ICD-10-CM | POA: Insufficient documentation

## 2023-08-21 DIAGNOSIS — R739 Hyperglycemia, unspecified: Secondary | ICD-10-CM | POA: Diagnosis not present

## 2023-08-21 DIAGNOSIS — Z23 Encounter for immunization: Secondary | ICD-10-CM

## 2023-08-21 DIAGNOSIS — I10 Essential (primary) hypertension: Secondary | ICD-10-CM | POA: Diagnosis not present

## 2023-08-21 DIAGNOSIS — E559 Vitamin D deficiency, unspecified: Secondary | ICD-10-CM | POA: Diagnosis not present

## 2023-08-21 LAB — URINALYSIS, ROUTINE W REFLEX MICROSCOPIC
Bilirubin Urine: NEGATIVE
Leukocytes,Ua: NEGATIVE
Nitrite: NEGATIVE
Specific Gravity, Urine: 1.02 (ref 1.000–1.030)
Total Protein, Urine: NEGATIVE
Urine Glucose: NEGATIVE
Urobilinogen, UA: 0.2 (ref 0.0–1.0)
pH: 6 (ref 5.0–8.0)

## 2023-08-21 NOTE — Progress Notes (Signed)
Patient ID: Madeline Mcdaniel, female   DOB: 11-Jul-1941, 82 y.o.   MRN: [id number]        Chief Complaint: follow up HTN, HLD and hyperglycemia , low vit d,        HPI:  Madeline Mcdaniel is a 82 y.o. female here with husband overall doing well.  Pt denies chest pain, increased sob or doe, wheezing, orthopnea, PND, increased LE swelling, palpitations, dizziness or syncope.   Pt denies polydipsia, polyuria, or new focal neuro s/s.    Pt denies fever, wt loss, night sweats, loss of appetite, or other constitutional symptoms  Does have mild right flank pain, and pt with dementia o/w unable to characterize.         Wt Readings from Last 3 Encounters:  08/21/23 100 lb (45.4 kg)  02/18/23 102 lb (46.3 kg)  10/22/22 98 lb (44.5 kg)   BP Readings from Last 3 Encounters:  08/21/23 120/70  02/18/23 118/68  10/22/22 116/64         Past Medical History:  Diagnosis Date   Allergic rhinitis, cause unspecified 04/27/2011   Anemia 04/27/2011   Asthma 04/27/2011   Bipolar affective disorder (HCC) 04/27/2011   Cervical spondylosis 06/01/2012   Chronic bronchitis 04/27/2011   Chronic cystitis 04/27/2011   Colon polyps 04/27/2011   Degenerative joint disease 04/27/2011   GERD (gastroesophageal reflux disease) 04/27/2011   HTN (hypertension) 04/27/2011   Hyperlipidemia 04/27/2011   Mild cognitive impairment 04/12/2013   Osteopenia 06/01/2012   Recurrent UTI 04/27/2011   Right carpal tunnel syndrome 07/22/2012   TIA (transient ischemic attack) 04/27/2011   Urinary incontinence 04/27/2011   Past Surgical History:  Procedure Laterality Date   BLADDER SUSPENSION     A&P REPAIR WITH MESH   BREAST BIOPSY     BREAST LUMPECTOMY     SEVERAL TIMES   BUNIONECTOMY     LEFT FOOT   CHOLECYSTECTOMY     COLONOSCOPY  74YRS AGO   IN [place],PT DOES NOT REMEMBER DOCTOR   McBride Bunionectomy Left 09/09/2014   @ [facility]   NEURECTOMY FOOT Left 09/09/2014   @ [facility]   ORTHOSCOPIC CARPETOMY     BOTH THUMBS   POLYPECTOMY   10 YRS AGO   BENIGN POLYPS X2   STERIOTACTIC BREAST BIOPSY     RIGHT   TENDON TRANSFER  12/09/2012   Procedure: TENDON TRANSFER;  Surgeon: Nicki Reaper, MD;  Location: [facility];  Service: Orthopedics;  Laterality: Left;  Tenodesis PIP with FDS Slip Left ring finger, Juggerknot   TONSILLECTOMY     TOTAL ABDOMINAL HYSTERECTOMY     TRIGGER FINGER RELEASE     LEFT RING FINGER    reports that she has never smoked. She has never used smokeless tobacco. She reports that she does not drink alcohol and does not use drugs. family history includes Cancer (age of onset: 49) in her mother; Colon cancer in her paternal grandfather. Allergies  Allergen Reactions   Aspirin Anaphylaxis   Nsaids Anaphylaxis    BECAUSE OF REACTION TO VIOXX BECAUSE OF REACTION TO VIOXX   Other Anaphylaxis    BECAUSE OF REACTION TO VIOXX BECAUSE OF REACTION TO VIOXX BECAUSE OF REACTION TO VIOXX   Vioxx [Rofecoxib] Anaphylaxis   Ciprofloxacin Other (See Comments)    HEADACHE HEADACHE   Current Outpatient Medications on File Prior to Visit  Medication Sig Dispense Refill   acetaminophen (TYLENOL) 500 MG tablet Take by mouth. (Patient not taking:  Reported on 05/15/2023)     Cholecalciferol (THERA-D 2000) 50 MCG (2000 UT) TABS 1 tab by mouth once daily (Patient not taking: Reported on 05/15/2023) 90 tablet 99   potassium chloride (KLOR-CON 10) 10 MEQ tablet Take 1 tablet (10 mEq total) by mouth daily. (Patient not taking: Reported on 05/15/2023) 5 tablet 0   No current facility-administered medications on file prior to visit.        ROS:  All others reviewed and negative.  Objective        PE:  BP 120/70 (BP Location: Right Arm, Patient Position: Sitting, Cuff Size: Normal)   Pulse (!) 57   Temp 98.1 F (36.7 C) (Oral)   Ht 4\' 9"  (1.448 m)   Wt 100 lb (45.4 kg)   SpO2 98%   BMI 21.64 kg/m                 Constitutional: Pt appears in NAD               HENT: Head: NCAT.                Right Ear:  External ear normal.                 Left Ear: External ear normal.                Eyes: . Pupils are equal, round, and reactive to light. Conjunctivae and EOM are normal               Nose: without d/c or deformity               Neck: Neck supple. Gross normal ROM               Cardiovascular: Normal rate and regular rhythm.                 Pulmonary/Chest: Effort normal and breath sounds without rales or wheezing.                Abd:  Soft, NT, ND, + BS, no organomegaly               Neurological: Pt is alert. At baseline orientation, motor grossly intact               Skin: Skin is warm. No rashes, no other new lesions, LE edema - none               Psychiatric: Pt behavior is normal without agitation   Micro: none  Cardiac tracings I have personally interpreted today:  none  Pertinent Radiological findings (summarize): none   Lab Results  Component Value Date   WBC 7.0 02/18/2023   HGB 13.7 02/18/2023   HCT 41.1 02/18/2023   PLT 90.0 (L) 02/18/2023   GLUCOSE 98 02/18/2023   CHOL 209 (H) 02/18/2023   TRIG 223.0 (H) 02/18/2023   HDL 67.20 02/18/2023   LDLDIRECT 113.0 02/18/2023   LDLCALC 70 09/10/2022   ALT 7 02/18/2023   AST 14 02/18/2023   NA 143 02/18/2023   K 2.9 (L) 02/18/2023   CL 106 02/18/2023   CREATININE 0.68 02/18/2023   BUN 13 02/18/2023   CO2 28 02/18/2023   TSH 1.71 09/10/2022   HGBA1C 5.8 02/18/2023   Assessment/Plan:  Madeline Mcdaniel is a 82 y.o. White or Caucasian [1] female with  has a past medical history of Allergic rhinitis, cause unspecified (04/27/2011), Anemia (04/27/2011), Asthma (04/27/2011), Bipolar affective  disorder (HCC) (04/27/2011), Cervical spondylosis (06/01/2012), Chronic bronchitis (04/27/2011), Chronic cystitis (04/27/2011), Colon polyps (04/27/2011), Degenerative joint disease (04/27/2011), GERD (gastroesophageal reflux disease) (04/27/2011), HTN (hypertension) (04/27/2011), Hyperlipidemia (04/27/2011), Mild cognitive impairment (04/12/2013),  Osteopenia (06/01/2012), Recurrent UTI (04/27/2011), Right carpal tunnel syndrome (07/22/2012), TIA (transient ischemic attack) (04/27/2011), and Urinary incontinence (04/27/2011).  HTN (hypertension) BP Readings from Last 3 Encounters:  08/21/23 120/70  02/18/23 118/68  10/22/22 116/64   Stable, pt to continue medical treatment  - diet, wt control   Hyperglycemia Lab Results  Component Value Date   HGBA1C 5.8 02/18/2023   Stable, pt to continue current medical treatment  - diet, wt control   Hyperlipidemia Lab Results  Component Value Date   LDLCALC 70 09/10/2022   Uncontrolled, goal ldl < 70,, pt to continue current tx- diet, wt control, declines statin   Vitamin D deficiency Last vitamin D Lab Results  Component Value Date   VD25OH 21.94 (L) 02/18/2023   Low, to start oral replacement   Right flank pain Exam benign, for UA today but suspect msk etiology  Followup: Return in about 6 months (around 02/18/2024).  Oliver Barre, MD 08/21/2023 8:45 PM [facility]

## 2023-08-21 NOTE — Assessment & Plan Note (Signed)
Exam benign, for UA today but suspect msk etiology

## 2023-08-21 NOTE — Assessment & Plan Note (Signed)
Last vitamin D Lab Results  Component Value Date   VD25OH 21.94 (L) 02/18/2023   Low, to start oral replacement

## 2023-08-21 NOTE — Patient Instructions (Signed)
You had the flu shot today  Please continue all other medications as before, and refills have been done if requested.  Please have the pharmacy call with any other refills you may need.  Please continue your efforts at being more active, low cholesterol diet, and weight control.  Please keep your appointments with your specialists as you may have planned  Please go to the LAB at the blood drawing area for the tests to be done - just the urine testing today  You will be contacted by phone if any changes need to be made immediately.  Otherwise, you will receive a letter about your results with an explanation, but please check with MyChart first.  Please make an Appointment to return in 6 months, or sooner if needed

## 2023-08-21 NOTE — Assessment & Plan Note (Signed)
BP Readings from Last 3 Encounters:  08/21/23 120/70  02/18/23 118/68  10/22/22 116/64   Stable, pt to continue medical treatment  - diet, wt control

## 2023-08-21 NOTE — Assessment & Plan Note (Signed)
Lab Results  Component Value Date   LDLCALC 70 09/10/2022   Uncontrolled, goal ldl < 70,, pt to continue current tx- diet, wt control, declines statin

## 2023-08-21 NOTE — Assessment & Plan Note (Signed)
Lab Results  Component Value Date   HGBA1C 5.8 02/18/2023   Stable, pt to continue current medical treatment  - diet, wt control

## 2023-08-22 LAB — URINE CULTURE: Result:: NO GROWTH

## 2023-09-23 ENCOUNTER — Other Ambulatory Visit: Payer: Self-pay | Admitting: Internal Medicine

## 2023-09-23 DIAGNOSIS — Z1231 Encounter for screening mammogram for malignant neoplasm of breast: Secondary | ICD-10-CM

## 2023-10-03 IMAGING — MG MM DIGITAL SCREENING BILAT W/ TOMO AND CAD
8 series · 9 of 24 positions shown · non-contrast
Comparison: Previous exam(s).

CLINICAL DATA: Screening.

EXAM:
DIGITAL SCREENING BILATERAL MAMMOGRAM WITH TOMOSYNTHESIS AND CAD
TECHNIQUE: Bilateral screening digital craniocaudal and mediolateral oblique
mammograms were obtained. Bilateral screening digital breast
tomosynthesis was performed. The images were evaluated with
computer-aided detection.

[R MLO synth-2D]
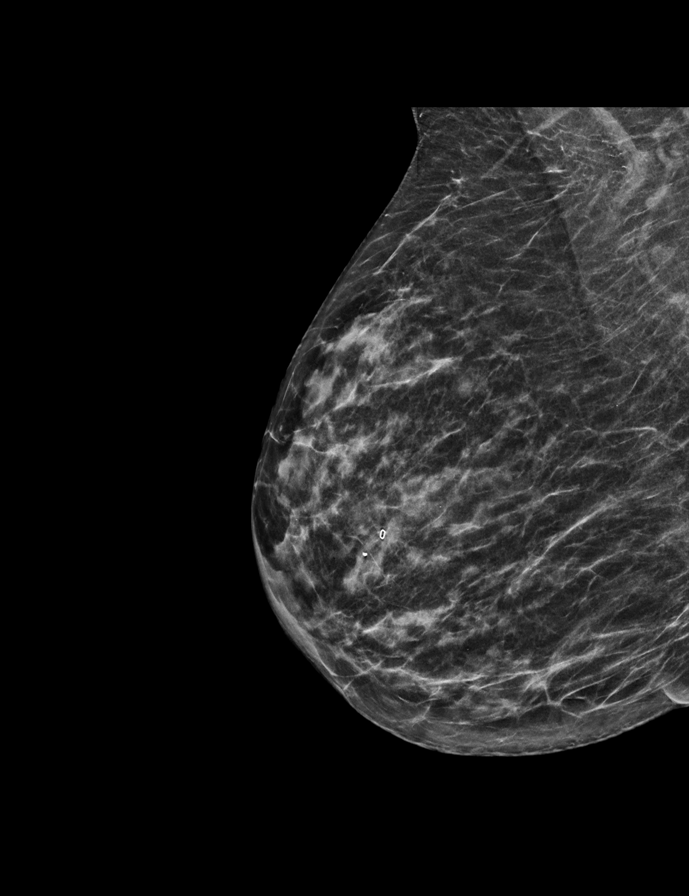

[R CC synth-2D]
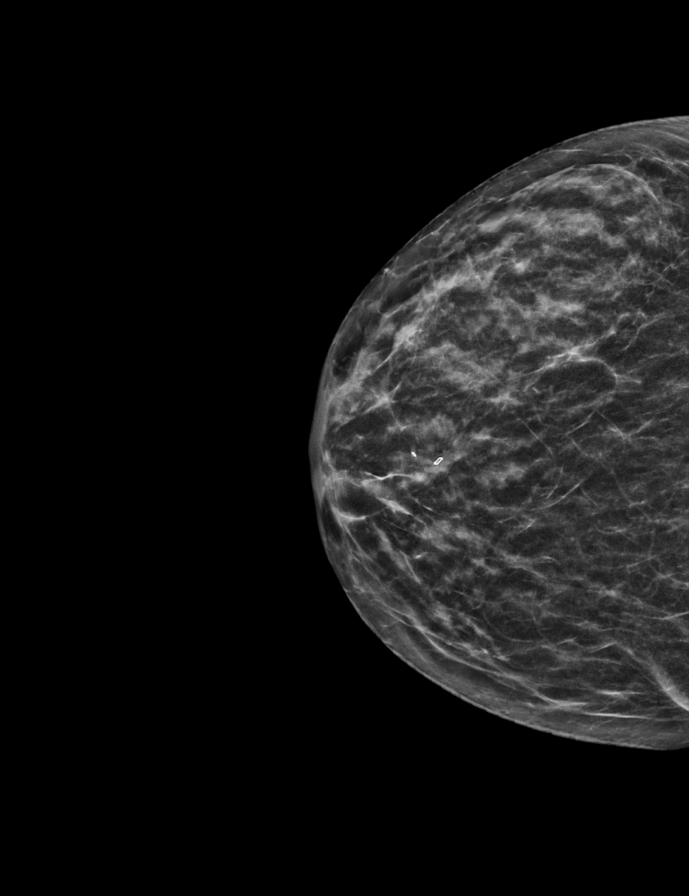

[L CC synth-2D]
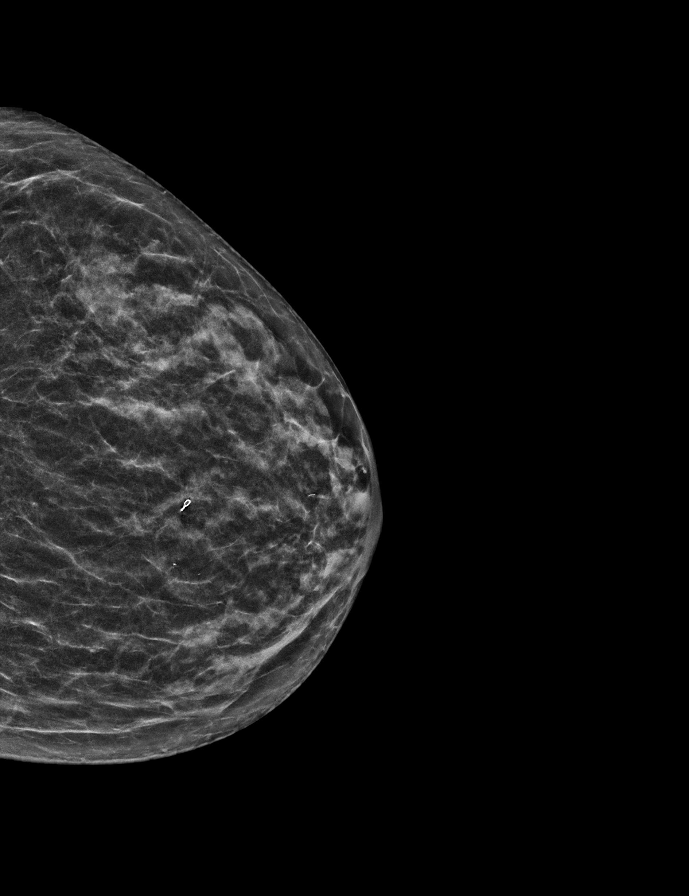

[L MLO synth-2D]
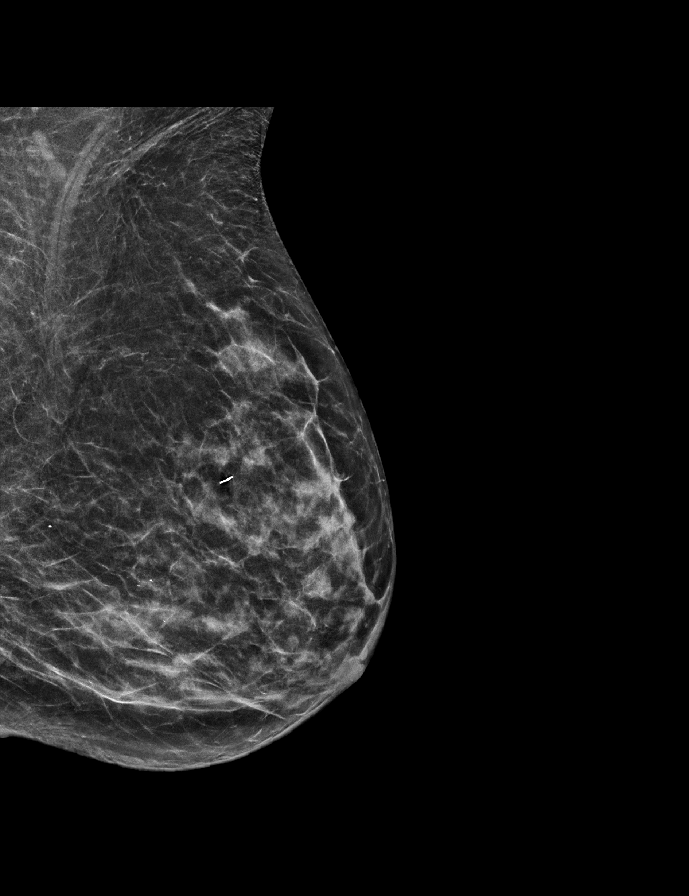

[L MLO tomo · 2 of 50 frames shown]
[frame 17/50]
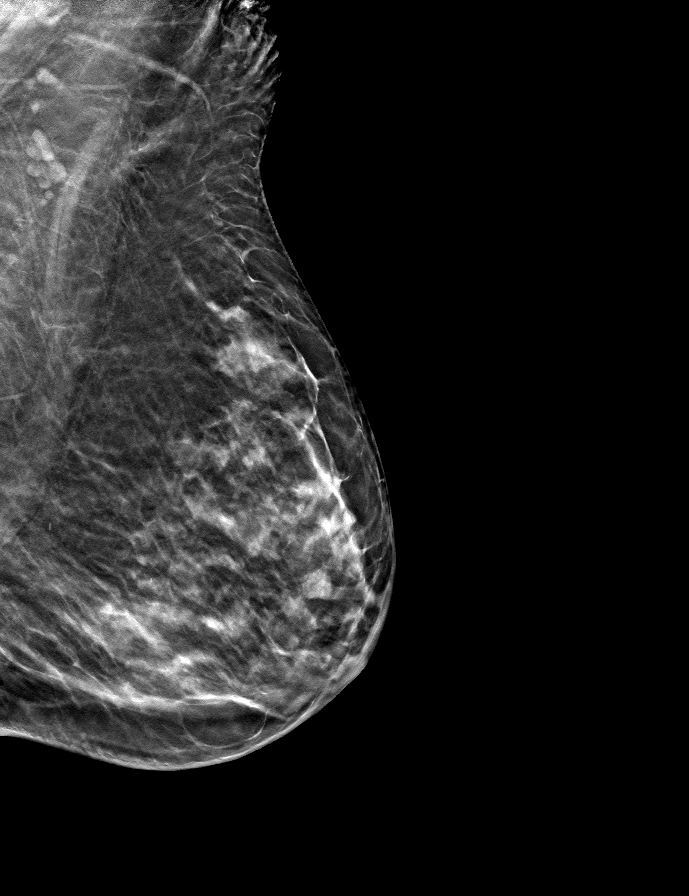
[frame 25/50]
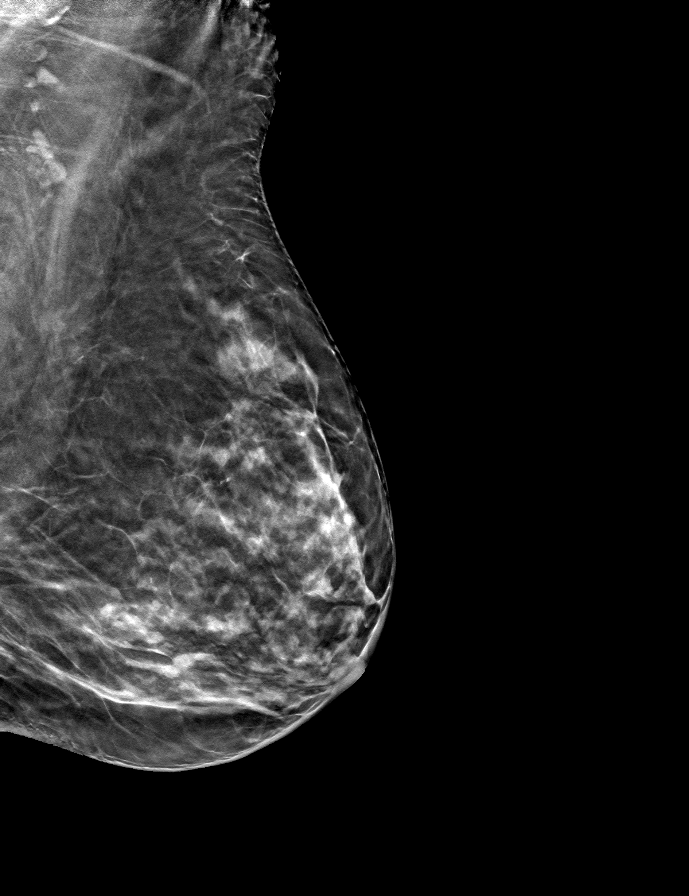

[R CC tomo · tomo slice 23/44.0]
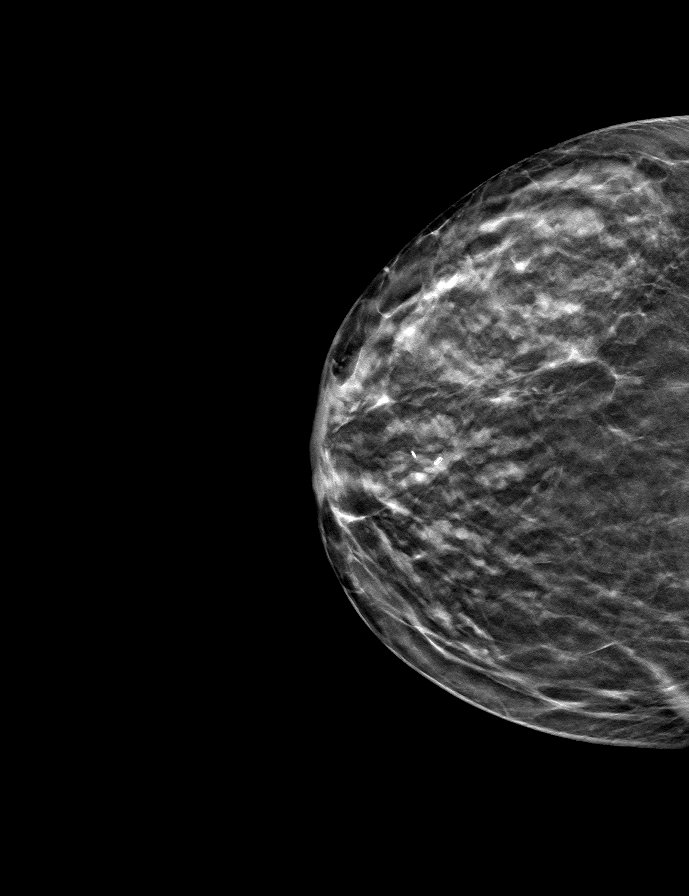

[R MLO tomo · tomo slice 25/48.0]
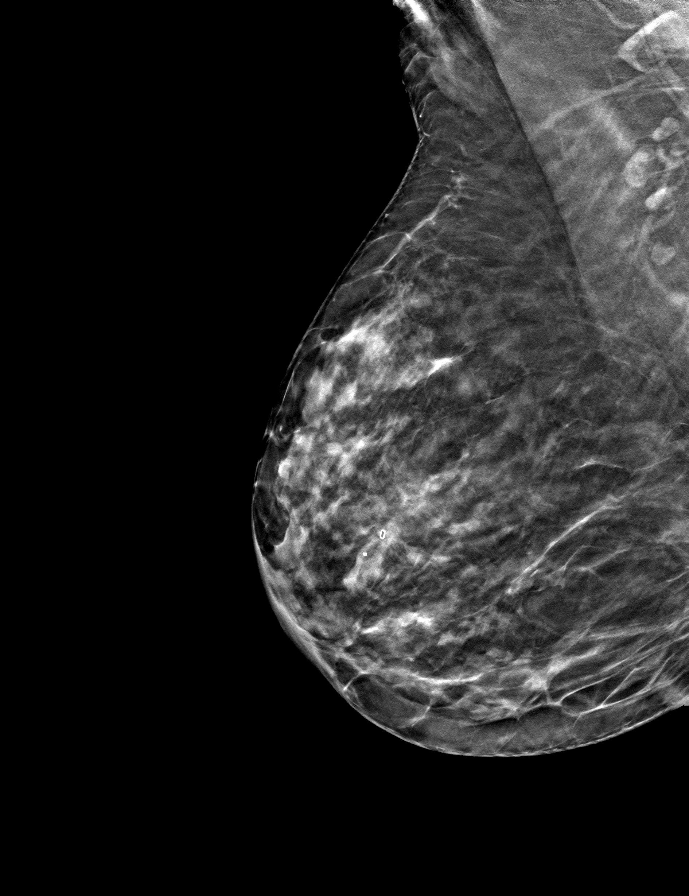

[L CC tomo · tomo slice 22/43.0]
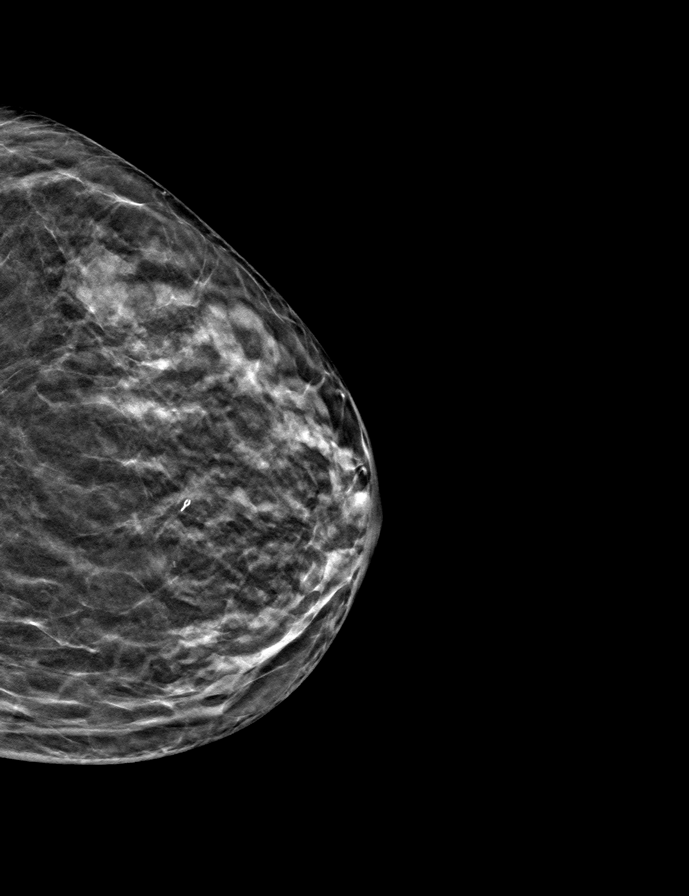

[9 of 24 positions shown; findings below may reference images not displayed]

ACR Breast Density Category c: The breast tissue is heterogeneously
dense, which may obscure small masses.
FINDINGS: There are no findings suspicious for malignancy.
IMPRESSION: No mammographic evidence of malignancy. A result letter of this
screening mammogram will be mailed directly to the patient.

RECOMMENDATION:
Screening mammogram in one year. (Code:Q3-W-BC3)

BI-RADS CATEGORY  1: Negative.

## 2023-10-08 DIAGNOSIS — Z23 Encounter for immunization: Secondary | ICD-10-CM | POA: Diagnosis not present

## 2023-10-18 ENCOUNTER — Ambulatory Visit
Admission: RE | Admit: 2023-10-18 | Discharge: 2023-10-18 | Disposition: A | Discharge status: Home / Self Care | Payer: Medicare Other | Source: Ambulatory Visit | Attending: Internal Medicine | Admitting: Internal Medicine

## 2023-10-18 DIAGNOSIS — Z1231 Encounter for screening mammogram for malignant neoplasm of breast: Secondary | ICD-10-CM | POA: Diagnosis not present

## 2023-11-07 DIAGNOSIS — Z961 Presence of intraocular lens: Secondary | ICD-10-CM | POA: Diagnosis not present

## 2023-11-07 DIAGNOSIS — H25812 Combined forms of age-related cataract, left eye: Secondary | ICD-10-CM | POA: Diagnosis not present

## 2023-11-07 DIAGNOSIS — H26491 Other secondary cataract, right eye: Secondary | ICD-10-CM | POA: Diagnosis not present

## 2023-11-21 DIAGNOSIS — Z1330 Encounter for screening examination for mental health and behavioral disorders, unspecified: Secondary | ICD-10-CM | POA: Diagnosis not present

## 2023-11-21 DIAGNOSIS — N3941 Urge incontinence: Secondary | ICD-10-CM | POA: Diagnosis not present

## 2023-11-21 DIAGNOSIS — Z87440 Personal history of urinary (tract) infections: Secondary | ICD-10-CM | POA: Diagnosis not present

## 2024-02-18 ENCOUNTER — Encounter: Payer: Self-pay | Admitting: Internal Medicine

## 2024-02-18 ENCOUNTER — Ambulatory Visit (INDEPENDENT_AMBULATORY_CARE_PROVIDER_SITE_OTHER): Payer: Medicare Other | Admitting: Internal Medicine

## 2024-02-18 VITALS — BP 120/72 | HR 67 | Temp 98.2°F | Ht <= 58 in | Wt 104.0 lb

## 2024-02-18 DIAGNOSIS — I10 Essential (primary) hypertension: Secondary | ICD-10-CM | POA: Diagnosis not present

## 2024-02-18 DIAGNOSIS — E7800 Pure hypercholesterolemia, unspecified: Secondary | ICD-10-CM

## 2024-02-18 DIAGNOSIS — E559 Vitamin D deficiency, unspecified: Secondary | ICD-10-CM

## 2024-02-18 DIAGNOSIS — F0390 Unspecified dementia without behavioral disturbance: Secondary | ICD-10-CM

## 2024-02-18 DIAGNOSIS — E538 Deficiency of other specified B group vitamins: Secondary | ICD-10-CM

## 2024-02-18 DIAGNOSIS — R739 Hyperglycemia, unspecified: Secondary | ICD-10-CM | POA: Diagnosis not present

## 2024-02-18 LAB — LIPID PANEL
Cholesterol: 186 mg/dL (ref 0–200)
HDL: 65.4 mg/dL (ref >39.00)
LDL Cholesterol: 73 mg/dL (ref 0–99)
NonHDL: 120.45
Total CHOL/HDL Ratio: 3
Triglycerides: 236 mg/dL — ABNORMAL HIGH (ref 0.0–149.0)
VLDL: 47.2 mg/dL — ABNORMAL HIGH (ref 0.0–40.0)

## 2024-02-18 LAB — BASIC METABOLIC PANEL
BUN: 19 mg/dL (ref 6–23)
CO2: 29 meq/L (ref 19–32)
Calcium: 9.9 mg/dL (ref 8.4–10.5)
Chloride: 106 meq/L (ref 96–112)
Creatinine, Ser: 0.69 mg/dL (ref 0.40–1.20)
GFR: 79.79 mL/min (ref >60.00)
Glucose, Bld: 70 mg/dL (ref 70–99)
Potassium: 3.5 meq/L (ref 3.5–5.1)
Sodium: 142 meq/L (ref 135–145)

## 2024-02-18 LAB — CBC WITH DIFFERENTIAL/PLATELET
Basophils Absolute: 0 10*3/uL (ref 0.0–0.1)
Basophils Relative: 0.5 % (ref 0.0–3.0)
Eosinophils Absolute: 0.1 10*3/uL (ref 0.0–0.7)
Eosinophils Relative: 0.9 % (ref 0.0–5.0)
HCT: 43.3 % (ref 36.0–46.0)
Hemoglobin: 14.4 g/dL (ref 12.0–15.0)
Lymphocytes Relative: 34.6 % (ref 12.0–46.0)
Lymphs Abs: 2.4 10*3/uL (ref 0.7–4.0)
MCHC: 33.3 g/dL (ref 30.0–36.0)
MCV: 87.1 fl (ref 78.0–100.0)
Monocytes Absolute: 0.5 10*3/uL (ref 0.1–1.0)
Monocytes Relative: 7.2 % (ref 3.0–12.0)
Neutro Abs: 3.9 10*3/uL (ref 1.4–7.7)
Neutrophils Relative %: 56.8 % (ref 43.0–77.0)
Platelets: 79 10*3/uL — ABNORMAL LOW (ref 150.0–400.0)
RBC: 4.96 Mil/uL (ref 3.87–5.11)
RDW: 13.7 % (ref 11.5–15.5)
WBC: 6.8 10*3/uL (ref 4.0–10.5)

## 2024-02-18 LAB — VITAMIN B12: Vitamin B-12: 176 pg/mL — ABNORMAL LOW (ref 211–911)

## 2024-02-18 LAB — HEPATIC FUNCTION PANEL
ALT: 11 U/L (ref 0–35)
AST: 15 U/L (ref 0–37)
Albumin: 4.5 g/dL (ref 3.5–5.2)
Alkaline Phosphatase: 56 U/L (ref 39–117)
Bilirubin, Direct: 0.2 mg/dL (ref 0.0–0.3)
Total Bilirubin: 0.9 mg/dL (ref 0.2–1.2)
Total Protein: 7.5 g/dL (ref 6.0–8.3)

## 2024-02-18 LAB — TSH: TSH: 1.71 u[IU]/mL (ref 0.35–5.50)

## 2024-02-18 LAB — VITAMIN D 25 HYDROXY (VIT D DEFICIENCY, FRACTURES): VITD: 20.95 ng/mL — ABNORMAL LOW (ref 30.00–100.00)

## 2024-02-18 LAB — HEMOGLOBIN A1C: Hgb A1c MFr Bld: 5.9 % (ref 4.6–6.5)

## 2024-02-18 NOTE — Progress Notes (Unsigned)
 Patient ID: Madeline Mcdaniel, female   DOB: Jan 04, 1941, 83 y.o.   MRN: [id number]        Chief Complaint: follow up with husband       HPI:  Madeline Mcdaniel is a 83 y.o. female here overall doing well; Dementia overall stable symptomatically, and not assoc with behavioral changes such as hallucinations, paranoia, or agitation.  Pt denies chest pain, increased sob or doe, wheezing, orthopnea, PND, increased LE swelling, palpitations, dizziness or syncope.   Pt denies polydipsia, polyuria, or new focal neuro s/s.    Pt denies fever, wt loss, night sweats, loss of appetite, or other constitutional symptoms        Wt Readings from Last 3 Encounters:  02/18/24 104 lb (47.2 kg)  08/21/23 100 lb (45.4 kg)  02/18/23 102 lb (46.3 kg)   BP Readings from Last 3 Encounters:  02/18/24 120/72  08/21/23 120/70  02/18/23 118/68         Past Medical History:  Diagnosis Date   Allergic rhinitis, cause unspecified 04/27/2011   Anemia 04/27/2011   Asthma 04/27/2011   Bipolar affective disorder (HCC) 04/27/2011   Cervical spondylosis 06/01/2012   Chronic bronchitis 04/27/2011   Chronic cystitis 04/27/2011   Colon polyps 04/27/2011   Degenerative joint disease 04/27/2011   GERD (gastroesophageal reflux disease) 04/27/2011   HTN (hypertension) 04/27/2011   Hyperlipidemia 04/27/2011   Mild cognitive impairment 04/12/2013   Osteopenia 06/01/2012   Recurrent UTI 04/27/2011   Right carpal tunnel syndrome 07/22/2012   TIA (transient ischemic attack) 04/27/2011   Urinary incontinence 04/27/2011   Past Surgical History:  Procedure Laterality Date   BLADDER SUSPENSION     A&P REPAIR WITH MESH   BREAST BIOPSY     BREAST LUMPECTOMY     SEVERAL TIMES   BUNIONECTOMY     LEFT FOOT   CHOLECYSTECTOMY     COLONOSCOPY  78YRS AGO   IN [place],PT DOES NOT REMEMBER DOCTOR   McBride Bunionectomy Left 09/09/2014   @ [facility]   NEURECTOMY FOOT Left 09/09/2014   @ [facility]   ORTHOSCOPIC CARPETOMY     BOTH THUMBS    POLYPECTOMY  10 YRS AGO   BENIGN POLYPS X2   STERIOTACTIC BREAST BIOPSY     RIGHT   TENDON TRANSFER  12/09/2012   Procedure: TENDON TRANSFER;  Surgeon: Nicki Reaper, MD;  Location: [facility];  Service: Orthopedics;  Laterality: Left;  Tenodesis PIP with FDS Slip Left ring finger, Juggerknot   TONSILLECTOMY     TOTAL ABDOMINAL HYSTERECTOMY     TRIGGER FINGER RELEASE     LEFT RING FINGER    reports that she has never smoked. She has never used smokeless tobacco. She reports that she does not drink alcohol and does not use drugs. family history includes Cancer (age of onset: 66) in her mother; Colon cancer in her paternal grandfather. Allergies  Allergen Reactions   Aspirin Anaphylaxis   Nsaids Anaphylaxis    BECAUSE OF REACTION TO VIOXX BECAUSE OF REACTION TO VIOXX   Other Anaphylaxis    BECAUSE OF REACTION TO VIOXX BECAUSE OF REACTION TO VIOXX BECAUSE OF REACTION TO VIOXX   Vioxx [Rofecoxib] Anaphylaxis   Ciprofloxacin Other (See Comments)    HEADACHE HEADACHE   Current Outpatient Medications on File Prior to Visit  Medication Sig Dispense Refill   acetaminophen (TYLENOL) 500 MG tablet Take by mouth. (Patient not taking: Reported on 02/18/2024)     Cholecalciferol (THERA-D  2000) 50 MCG (2000 UT) TABS 1 tab by mouth once daily (Patient not taking: Reported on 05/15/2023) 90 tablet 99   potassium chloride (KLOR-CON 10) 10 MEQ tablet Take 1 tablet (10 mEq total) by mouth daily. (Patient not taking: Reported on 02/18/2024) 5 tablet 0   No current facility-administered medications on file prior to visit.        ROS:  All others reviewed and negative.  Objective        PE:  BP 120/72 (BP Location: Left Arm, Patient Position: Sitting, Cuff Size: Normal)   Pulse 67   Temp 98.2 F (36.8 C) (Oral)   Ht 4\' 9"  (1.448 m)   Wt 104 lb (47.2 kg)   SpO2 98%   BMI 22.51 kg/m                 Constitutional: Pt appears in NAD               HENT: Head: NCAT.                 Right Ear: External ear normal.                 Left Ear: External ear normal.                Eyes: . Pupils are equal, round, and reactive to light. Conjunctivae and EOM are normal               Nose: without d/c or deformity               Neck: Neck supple. Gross normal ROM               Cardiovascular: Normal rate and regular rhythm.                 Pulmonary/Chest: Effort normal and breath sounds without rales or wheezing.                Abd:  Soft, NT, ND, + BS, no organomegaly               Neurological: Pt is alert. At baseline orientation, motor grossly intact               Skin: Skin is warm. No rashes, no other new lesions, LE edema - none               Psychiatric: Pt behavior is normal without agitation   Micro: none  Cardiac tracings I have personally interpreted today:  none  Pertinent Radiological findings (summarize): none   Lab Results  Component Value Date   WBC 6.8 02/18/2024   HGB 14.4 02/18/2024   HCT 43.3 02/18/2024   PLT 79.0 (L) 02/18/2024   GLUCOSE 70 02/18/2024   CHOL 186 02/18/2024   TRIG 236.0 (H) 02/18/2024   HDL 65.40 02/18/2024   LDLDIRECT 113.0 02/18/2023   LDLCALC 73 02/18/2024   ALT 11 02/18/2024   AST 15 02/18/2024   NA 142 02/18/2024   K 3.5 02/18/2024   CL 106 02/18/2024   CREATININE 0.69 02/18/2024   BUN 19 02/18/2024   CO2 29 02/18/2024   TSH 1.71 02/18/2024   HGBA1C 5.9 02/18/2024   Assessment/Plan:  Madeline Mcdaniel is a 83 y.o. White or Caucasian [1] female with  has a past medical history of Allergic rhinitis, cause unspecified (04/27/2011), Anemia (04/27/2011), Asthma (04/27/2011), Bipolar affective disorder (HCC) (04/27/2011), Cervical spondylosis (06/01/2012), Chronic bronchitis (04/27/2011), Chronic cystitis (04/27/2011),  Colon polyps (04/27/2011), Degenerative joint disease (04/27/2011), GERD (gastroesophageal reflux disease) (04/27/2011), HTN (hypertension) (04/27/2011), Hyperlipidemia (04/27/2011), Mild cognitive impairment (04/12/2013),  Osteopenia (06/01/2012), Recurrent UTI (04/27/2011), Right carpal tunnel syndrome (07/22/2012), TIA (transient ischemic attack) (04/27/2011), and Urinary incontinence (04/27/2011).  Dementia without behavioral disturbance (HCC) Stable per husband, pt and husband consistetly decline further evaluation or tx  HTN (hypertension) BP Readings from Last 3 Encounters:  02/18/24 120/72  08/21/23 120/70  02/18/23 118/68   Stable, pt to continue medical treatment  - diet, wt control  Hyperglycemia Lab Results  Component Value Date   HGBA1C 5.9 02/18/2024   Stable, pt to continue current medical treatment  - diet, wt control  Hyperlipidemia Lab Results  Component Value Date   LDLCALC 73 02/18/2024   uncontrolled, goal ldl < 70,  pt conts lower chol diet, declines statin  Vitamin D deficiency Last vitamin D Lab Results  Component Value Date   VD25OH 20.95 (L) 02/18/2024   Low, to start oral replacement  Followup: Return in about 6 months (around 08/20/2024).  Oliver Barre, MD 02/19/2024 8:52 PM [facility] [place] [facility]

## 2024-02-18 NOTE — Patient Instructions (Signed)

## 2024-02-19 ENCOUNTER — Encounter: Payer: Self-pay | Admitting: Internal Medicine

## 2024-02-19 DIAGNOSIS — E538 Deficiency of other specified B group vitamins: Secondary | ICD-10-CM | POA: Insufficient documentation

## 2024-02-19 LAB — URINALYSIS, ROUTINE W REFLEX MICROSCOPIC
Bilirubin Urine: NEGATIVE
Leukocytes,Ua: NEGATIVE
Nitrite: NEGATIVE
Specific Gravity, Urine: 1.025 (ref 1.000–1.030)
Total Protein, Urine: NEGATIVE
Urine Glucose: NEGATIVE
Urobilinogen, UA: 0.2 (ref 0.0–1.0)
pH: 6 (ref 5.0–8.0)

## 2024-02-19 NOTE — Assessment & Plan Note (Signed)
 Stable per husband, pt and husband consistetly decline further evaluation or tx

## 2024-02-19 NOTE — Assessment & Plan Note (Signed)
 Lab Results  Component Value Date   HGBA1C 5.9 02/18/2024   Stable, pt to continue current medical treatment  - diet, wt control

## 2024-02-19 NOTE — Assessment & Plan Note (Signed)
 Last vitamin D Lab Results  Component Value Date   VD25OH 20.95 (L) 02/18/2024   Low, to start oral replacement

## 2024-02-19 NOTE — Assessment & Plan Note (Signed)
 Lab Results  Component Value Date   LDLCALC 73 02/18/2024   uncontrolled, goal ldl < 70,  pt conts lower chol diet, declines statin

## 2024-02-19 NOTE — Assessment & Plan Note (Signed)
 BP Readings from Last 3 Encounters:  02/18/24 120/72  08/21/23 120/70  02/18/23 118/68   Stable, pt to continue medical treatment  - diet, wt control

## 2024-05-15 ENCOUNTER — Ambulatory Visit (INDEPENDENT_AMBULATORY_CARE_PROVIDER_SITE_OTHER)

## 2024-05-15 VITALS — BP 118/80 | HR 62 | Ht <= 58 in | Wt 104.0 lb

## 2024-05-15 DIAGNOSIS — Z0000 Encounter for general adult medical examination without abnormal findings: Secondary | ICD-10-CM

## 2024-05-15 NOTE — Progress Notes (Signed)
 Subjective:   Madeline Mcdaniel is a 83 y.o. who presents for a Medicare Wellness preventive visit.  As a reminder, Annual Wellness Visits don't include a physical exam, and some assessments may be limited, especially if this visit is performed virtually. We may recommend an in-person follow-up visit with your provider if needed.  Visit Complete: In person  VideoDeclined- This patient declined Interactive audio and Acupuncturist. Therefore the visit was completed with audio only.  Persons Participating in Visit: Patient assisted by her husband who is her caregiver.  AWV Questionnaire: No: Patient Medicare AWV questionnaire was not completed prior to this visit.  Cardiac Risk Factors include: advanced age (>65men, >55 women);hypertension;Other (see comment);dyslipidemia, Risk factor comments: TIA,  PHQ9 NOT DONE TODAY DUE TO PATIENT'S CONDITION.     Objective:    Today's Vitals   05/15/24 1538  Pulse: 62  SpO2: 99%  Weight: 104 lb (47.2 kg)  Height: 4' 9.5 (1.461 m)   Body mass index is 22.12 kg/m.     05/15/2024    3:44 PM 05/15/2023    4:18 PM 03/30/2022    2:12 PM 09/18/2021    3:12 PM 03/22/2021    1:08 PM 01/31/2021    8:48 AM 12/23/2020    9:23 AM  Advanced Directives  Does Patient Have a Medical Advance Directive? Yes Yes Yes Yes Yes Yes Yes  Type of Estate agent of [place];Living will Living will Healthcare Power of [place];Living will  Living will;Healthcare Power of [employer] Power of [place];Living will  Does patient want to make changes to medical advance directive?     No - Patient declined No - Patient declined   Copy of Healthcare Power of Attorney in Chart? No - copy requested  No - copy requested  No - copy requested      Current Medications (verified) Outpatient Encounter Medications as of 05/15/2024  Medication Sig   acetaminophen  (TYLENOL ) 500 MG tablet Take by mouth. (Patient not taking: Reported on 05/15/2024)    Cholecalciferol (THERA-D 2000) 50 MCG (2000 UT) TABS 1 tab by mouth once daily (Patient not taking: Reported on 05/15/2024)   potassium chloride  (KLOR-CON  10) 10 MEQ tablet Take 1 tablet (10 mEq total) by mouth daily. (Patient not taking: Reported on 05/15/2024)   No facility-administered encounter medications on file as of 05/15/2024.    Allergies (verified) Aspirin, Nsaids, Other, Vioxx [rofecoxib], and Ciprofloxacin   History: Past Medical History:  Diagnosis Date   Allergic rhinitis, cause unspecified 04/27/2011   Anemia 04/27/2011   Asthma 04/27/2011   Bipolar affective disorder (HCC) 04/27/2011   Cervical spondylosis 06/01/2012   Chronic bronchitis 04/27/2011   Chronic cystitis 04/27/2011   Colon polyps 04/27/2011   Degenerative joint disease 04/27/2011   GERD (gastroesophageal reflux disease) 04/27/2011   HTN (hypertension) 04/27/2011   Hyperlipidemia 04/27/2011   Mild cognitive impairment 04/12/2013   Osteopenia 06/01/2012   Recurrent UTI 04/27/2011   Right carpal tunnel syndrome 07/22/2012   TIA (transient ischemic attack) 04/27/2011   Urinary incontinence 04/27/2011   Past Surgical History:  Procedure Laterality Date   BLADDER SUSPENSION     A&P REPAIR WITH MESH   BREAST BIOPSY     BREAST LUMPECTOMY     SEVERAL TIMES   BUNIONECTOMY     LEFT FOOT   CHOLECYSTECTOMY     COLONOSCOPY  38YRS AGO   IN [place] ,PT DOES NOT REMEMBER Gary Kapur Left 09/09/2014   @ [facility]  NEURECTOMY FOOT Left 09/09/2014   @ [facility]   ORTHOSCOPIC CARPETOMY     BOTH THUMBS   POLYPECTOMY  10 YRS AGO   BENIGN POLYPS X2   STERIOTACTIC BREAST BIOPSY     RIGHT   TENDON TRANSFER  12/09/2012   Procedure: TENDON TRANSFER;  Surgeon: Kemp Patter, MD;  Location: [facility];  Service: Orthopedics;  Laterality: Left;  Tenodesis PIP with FDS Slip Left ring finger, Juggerknot   TONSILLECTOMY     TOTAL ABDOMINAL HYSTERECTOMY     TRIGGER FINGER RELEASE     LEFT RING  FINGER   Family History  Problem Relation Age of Onset   Cancer Mother 42       RENAL CELL CARCINOMA   Colon cancer Paternal Grandfather    Breast cancer Neg Hx    Social History   Socioeconomic History   Marital status: Married    Spouse name: Cotto,[name]   Number of children: 2   Years of education: Not on file   Highest education level: Not on file  Occupational History   Occupation: RETIRED  Tobacco Use   Smoking status: Never   Smokeless tobacco: Never  Vaping Use   Vaping status: Never Used  Substance and Sexual Activity   Alcohol use: No   Drug use: No   Sexual activity: Not Currently  Other Topics Concern   Not on file  Social History Narrative   Lives with husband and he her caregiver   Social Drivers of Health   Financial Resource Strain: Low Risk  (05/15/2024)   Overall Financial Resource Strain (CARDIA)    Difficulty of Paying Living Expenses: Not hard at all  Food Insecurity: No Food Insecurity (05/15/2024)   Hunger Vital Sign    Worried About Running Out of Food in the Last Year: Never true    Ran Out of Food in the Last Year: Never true  Transportation Needs: No Transportation Needs (05/15/2024)   PRAPARE - Administrator, Civil Service (Medical): No    Lack of Transportation (Non-Medical): No  Physical Activity: Inactive (05/15/2024)   Exercise Vital Sign    Days of Exercise per Week: 0 days    Minutes of Exercise per Session: 0 min  Stress: No Stress Concern Present (05/15/2024)   [employer] of Occupational Health - Occupational Stress Questionnaire    Feeling of Stress: Not at all  Social Connections: Socially Isolated (05/15/2024)   Social Connection and Isolation Panel    Frequency of Communication with Friends and Family: Never    Frequency of Social Gatherings with Friends and Family: Never    Attends Religious Services: Never    Database administrator or Organizations: No    Attends Engineer, structural: Never     Marital Status: Married    Tobacco Counseling Counseling given: Not Answered    Clinical Intake:  Pre-visit preparation completed: Yes  Pain : No/denies pain     BMI - recorded: 22.12 Nutritional Status: BMI of 19-24  Normal Nutritional Risks: None  Lab Results  Component Value Date   HGBA1C 5.9 02/18/2024   HGBA1C 5.8 02/18/2023   HGBA1C 6.1 09/10/2022     How often do you need to have someone help you when you read instructions, pamphlets, or other written materials from your doctor or pharmacy?: 1 - Never     Information entered by :: Sunnie Odden, RMA   Activities of Daily Living    05/15/2024  3:15 PM  In your present state of health, do you have any difficulty performing the following activities:  Hearing? 1  Comment slight hearing problem  Vision? 0  Difficulty concentrating or making decisions? 1  Walking or climbing stairs? 0  Dressing or bathing? 1  Comment husband is the care giver  Doing errands, shopping? 0  Comment Husband drives her around  Preparing Food and eating ? N  Using the Toilet? N  In the past six months, have you accidently leaked urine? N  Do you have problems with loss of bowel control? N  Managing your Medications? Y  Comment husband helps her/caregiver  Managing your Finances? Y  Housekeeping or managing your Housekeeping? Y    Patient Care Team: Roslyn Coombe, MD as PCP - General (Internal Medicine)  I have updated your Care Teams any recent Medical Services you may have received from other providers in the past year.     Assessment:   This is a routine wellness examination for [place].  Hearing/Vision screen Hearing Screening - Comments:: Denies hearing difficulties   Vision Screening - Comments:: Denies vision issues. Dr. Slyman   Goals Addressed   None    Depression Screen Did NOT ASK DUE TO PATIENT'S CONDITION    02/18/2024    1:50 PM 08/21/2023    1:15 PM 05/15/2023    4:21 PM 05/15/2023    4:17 PM  02/18/2023    1:08 PM 10/22/2022    2:08 PM 09/10/2022    1:15 PM  PHQ 2/9 Scores  PHQ - 2 Score 0 0 0 0 2 0 0  PHQ- 9 Score   0  8 0     Fall Risk     05/15/2024    3:45 PM 02/18/2024    1:54 PM 08/21/2023    1:14 PM 05/15/2023    4:21 PM 02/18/2023    1:05 PM  Fall Risk   Falls in the past year? 1 0 0 0 0  Number falls in past yr: 1 0 0 0 0  Comment 3 in one day      Injury with Fall?  0 0 0 0  Risk for fall due to :  No Fall Risks No Fall Risks No Fall Risks No Fall Risks  Risk for fall due to: Comment    with wheecahir   Follow up Falls evaluation completed;Falls prevention discussed Falls evaluation completed Falls evaluation completed Falls evaluation completed Falls evaluation completed    MEDICARE RISK AT HOME:  Medicare Risk at Home Any stairs in or around the home?: Yes If so, are there any without handrails?: No Home free of loose throw rugs in walkways, pet beds, electrical cords, etc?: Yes Adequate lighting in your home to reduce risk of falls?: Yes Life alert?: No Use of a cane, walker or w/c?: No Grab bars in the bathroom?: Yes Shower chair or bench in shower?: Yes Elevated toilet seat or a handicapped toilet?: Yes  TIMED UP AND GO:  Was the test performed?  Yes  Length of time to ambulate 10 feet: 15 sec Gait slow and steady without use of assistive device  Cognitive Function: Impaired: Patient has current diagnosis of cognitive impairment.    09/18/2021    3:00 PM 01/30/2016    2:57 PM 01/30/2016    2:48 PM 01/19/2016    5:13 PM  MMSE - Mini Mental State Exam  Not completed:  --  --  Orientation to time 0 5  5    Orientation to Place 0 5  5    Registration 3  3    Attention/ Calculation 0 3  3    Recall 0 3  3    Language- name 2 objects 2 2  2     Language- repeat 1 1 1    Language- follow 3 step command 3 3  1     Language- read & follow direction 1 1  1     Write a sentence 1 1  1     Copy design 0 1  1    Total score 11  26       Data saved with  a previous flowsheet row definition      11/19/2018   11:00 AM 10/22/2017   11:00 AM 04/10/2017    2:00 PM  Montreal Cognitive Assessment   Visuospatial/ Executive (0/5) 1 5 5   Naming (0/3) 2 3 3   Attention: Read list of digits (0/2) 2 2 2   Attention: Read list of letters (0/1) 0 1 1  Attention: Serial 7 subtraction starting at 100 (0/3) 0 2 1  Language: Repeat phrase (0/2) 1 2 1   Language : Fluency (0/1) 0 0 0  Abstraction (0/2) 1 2 2   Delayed Recall (0/5) 0 4 2  Orientation (0/6) 6 6 6   Total 13 27 23       05/15/2023    4:26 PM  6CIT Screen  What Year? 4 points  What month? 3 points  What time? 3 points  Count back from 20 4 points  Months in reverse 4 points  Repeat phrase 10 points  Total Score 28 points    Immunizations Immunization History  Administered Date(s) Administered   DTaP 09/18/2009   Fluad Quad(high Dose 65+) 08/07/2019, 09/10/2022   Fluad Trivalent(High Dose 65+) 08/21/2023   Influenza Split 08/30/2011, 09/16/2012   Influenza, High Dose Seasonal PF 07/31/2017, 08/07/2018, 08/22/2020   Influenza,inj,Quad PF,6+ Mos 08/25/2013, 08/25/2014, 08/16/2015, 07/31/2016   Influenza-Unspecified 09/25/2021   PFIZER(Purple Top)SARS-COV-2 Vaccination 12/15/2019, 01/04/2020, 08/31/2020, 03/31/2021, 09/25/2021   Pfizer Covid-19 Vaccine Bivalent Booster 78yrs & up 04/20/2022   Pneumococcal Conjugate-13 12/03/2006, 10/01/2013   Pneumococcal Polysaccharide-23 12/03/2009   Tetanus 04/08/2013   Zoster Recombinant(Shingrix) 08/13/2017, 02/07/2018, 02/07/2018   Zoster, Live 12/03/2009    Screening Tests Health Maintenance  Topic Date Due   DTaP/Tdap/Td (3 - Tdap) 04/09/2023   COVID-19 Vaccine (7 - 2024-25 season) 08/04/2023   INFLUENZA VACCINE  07/03/2024   Medicare Annual Wellness (AWV)  05/15/2025   Pneumococcal Vaccine: 50+ Years  Completed   DEXA SCAN  Completed   Zoster Vaccines- Shingrix  Completed   HPV VACCINES  Aged Out   Meningococcal B Vaccine  Aged Out     Health Maintenance  Health Maintenance Due  Topic Date Due   DTaP/Tdap/Td (3 - Tdap) 04/09/2023   COVID-19 Vaccine (7 - 2024-25 season) 08/04/2023   Health Maintenance Items Addressed: See Nurse Notes at the end of this note  Additional Screening:  Vision Screening: Recommended annual ophthalmology exams for early detection of glaucoma and other disorders of the eye. Would you like a referral to an eye doctor? No    Dental Screening: Recommended annual dental exams for proper oral hygiene  Community Resource Referral / Chronic Care Management: CRR required this visit?  No   CCM required this visit?  No   Plan:    I have personally reviewed and noted the following in the patient's chart:   Medical and social history  Use of alcohol, tobacco or illicit drugs  Current medications and supplements including opioid prescriptions. Patient is not currently taking opioid prescriptions. Functional ability and status Nutritional status Physical activity Advanced directives List of other physicians Hospitalizations, surgeries, and ER visits in previous 12 months Vitals Screenings to include cognitive, depression, and falls Referrals and appointments  In addition, I have reviewed and discussed with patient certain preventive protocols, quality metrics, and best practice recommendations. A written personalized care plan for preventive services as well as general preventive health recommendations were provided to patient.   Thorin Starner L Jalecia Leon, CMA   05/15/2024   After Visit Summary: (MyChart) Due to this being a telephonic visit, the after visit summary with patients personalized plan was offered to patient via MyChart   Notes: Patient's husband did visit with patient today due to her condition   Dementia without behavioral disturbance  Husband stated that patient does not take her medications.

## 2024-05-15 NOTE — Patient Instructions (Addendum)
 Ms. Errickson , Thank you for taking time out of your busy schedule to complete your Annual Wellness Visit with me. I enjoyed our conversation and look forward to speaking with you again next year. I, as well as your care team,  appreciate your ongoing commitment to your health goals. Please review the following plan we discussed and let me know if I can assist you in the future. Your Game plan/ To Do List   Follow up Visits: Next Medicare AWV with our clinical staff: 05/18/2025.   Have you seen your provider in the last 6 months (3 months if uncontrolled diabetes)? Yes Next Office Visit with your provider: 08/20/2024.  Clinician Recommendations:  Aim for 30 minutes of exercise or brisk walking, 6-8 glasses of water, and 5 servings of fruits and vegetables each day. Keep up the good work.  You are due for a tetanus vaccine and can get that at your local pharmacy.      This is a list of the screening recommended for you and due dates:  Health Maintenance  Topic Date Due   DTaP/Tdap/Td vaccine (3 - Tdap) 04/09/2023   COVID-19 Vaccine (7 - 2024-25 season) 08/04/2023   Flu Shot  07/03/2024   Medicare Annual Wellness Visit  05/15/2025   Pneumococcal Vaccine for age over 31  Completed   DEXA scan (bone density measurement)  Completed   Zoster (Shingles) Vaccine  Completed   HPV Vaccine  Aged Out   Meningitis B Vaccine  Aged Out    Advanced directives: (Copy Requested) Please bring a copy of your health care power of attorney and living will to the office to be added to your chart at your convenience. You can mail to [facility] [address] [place], [place] [address] or email to [email or web address] Advance Care Planning is important because it:  [x]  Makes sure you receive the medical care that is consistent with your values, goals, and preferences  [x]  It provides guidance to your family and loved ones and reduces their decisional burden about whether or not they are  making the right decisions based on your wishes.  Follow the link provided in your after visit summary or read over the paperwork we have mailed to you to help you started getting your Advance Directives in place. If you need assistance in completing these, please reach out to us  so that we can help you!  See attachments for Preventive Care and Fall Prevention Tips.

## 2024-08-20 ENCOUNTER — Encounter: Payer: Self-pay | Admitting: Internal Medicine

## 2024-08-20 ENCOUNTER — Ambulatory Visit: Payer: Self-pay | Admitting: Internal Medicine

## 2024-08-20 ENCOUNTER — Ambulatory Visit: Admitting: Internal Medicine

## 2024-08-20 VITALS — BP 116/80 | HR 65 | Temp 98.1°F | Ht <= 58 in

## 2024-08-20 DIAGNOSIS — I10 Essential (primary) hypertension: Secondary | ICD-10-CM

## 2024-08-20 DIAGNOSIS — Z23 Encounter for immunization: Secondary | ICD-10-CM

## 2024-08-20 DIAGNOSIS — R269 Unspecified abnormalities of gait and mobility: Secondary | ICD-10-CM

## 2024-08-20 DIAGNOSIS — E559 Vitamin D deficiency, unspecified: Secondary | ICD-10-CM

## 2024-08-20 DIAGNOSIS — D649 Anemia, unspecified: Secondary | ICD-10-CM | POA: Diagnosis not present

## 2024-08-20 DIAGNOSIS — R531 Weakness: Secondary | ICD-10-CM

## 2024-08-20 DIAGNOSIS — E7800 Pure hypercholesterolemia, unspecified: Secondary | ICD-10-CM

## 2024-08-20 DIAGNOSIS — E538 Deficiency of other specified B group vitamins: Secondary | ICD-10-CM | POA: Diagnosis not present

## 2024-08-20 DIAGNOSIS — R739 Hyperglycemia, unspecified: Secondary | ICD-10-CM | POA: Diagnosis not present

## 2024-08-20 LAB — LIPID PANEL
Cholesterol: 172 mg/dL (ref 0–200)
HDL: 62.9 mg/dL (ref >39.00)
LDL Cholesterol: 80 mg/dL (ref 0–99)
NonHDL: 109.28
Total CHOL/HDL Ratio: 3
Triglycerides: 144 mg/dL (ref 0.0–149.0)
VLDL: 28.8 mg/dL (ref 0.0–40.0)

## 2024-08-20 LAB — HEPATIC FUNCTION PANEL
ALT: 10 U/L (ref 0–35)
AST: 13 U/L (ref 0–37)
Albumin: 4.5 g/dL (ref 3.5–5.2)
Alkaline Phosphatase: 51 U/L (ref 39–117)
Bilirubin, Direct: 0.2 mg/dL (ref 0.0–0.3)
Total Bilirubin: 0.9 mg/dL (ref 0.2–1.2)
Total Protein: 7.2 g/dL (ref 6.0–8.3)

## 2024-08-20 LAB — VITAMIN D 25 HYDROXY (VIT D DEFICIENCY, FRACTURES): VITD: 22.05 ng/mL — ABNORMAL LOW (ref 30.00–100.00)

## 2024-08-20 LAB — IBC PANEL
Iron: 74 ug/dL (ref 42–145)
Saturation Ratios: 20.6 % (ref 20.0–50.0)
TIBC: 359.8 ug/dL (ref 250.0–450.0)
Transferrin: 257 mg/dL (ref 212.0–360.0)

## 2024-08-20 LAB — CBC WITH DIFFERENTIAL/PLATELET
Basophils Absolute: 0 K/uL (ref 0.0–0.1)
Basophils Relative: 0.3 % (ref 0.0–3.0)
Eosinophils Absolute: 0.1 K/uL (ref 0.0–0.7)
Eosinophils Relative: 0.9 % (ref 0.0–5.0)
HCT: 44.1 % (ref 36.0–46.0)
Hemoglobin: 14.5 g/dL (ref 12.0–15.0)
Lymphocytes Relative: 27.8 % (ref 12.0–46.0)
Lymphs Abs: 2.1 K/uL (ref 0.7–4.0)
MCHC: 32.9 g/dL (ref 30.0–36.0)
MCV: 86.5 fl (ref 78.0–100.0)
Monocytes Absolute: 0.5 K/uL (ref 0.1–1.0)
Monocytes Relative: 6.1 % (ref 3.0–12.0)
Neutro Abs: 5 K/uL (ref 1.4–7.7)
Neutrophils Relative %: 64.9 % (ref 43.0–77.0)
Platelets: 101 K/uL — ABNORMAL LOW (ref 150.0–400.0)
RBC: 5.1 Mil/uL (ref 3.87–5.11)
RDW: 13.6 % (ref 11.5–15.5)
WBC: 7.6 K/uL (ref 4.0–10.5)

## 2024-08-20 LAB — TSH: TSH: 1.24 u[IU]/mL (ref 0.35–5.50)

## 2024-08-20 LAB — BASIC METABOLIC PANEL WITH GFR
BUN: 27 mg/dL — ABNORMAL HIGH (ref 6–23)
CO2: 29 meq/L (ref 19–32)
Calcium: 10.1 mg/dL (ref 8.4–10.5)
Chloride: 111 meq/L (ref 96–112)
Creatinine, Ser: 0.71 mg/dL (ref 0.40–1.20)
GFR: 77.9 mL/min (ref >60.00)
Glucose, Bld: 93 mg/dL (ref 70–99)
Potassium: 3.6 meq/L (ref 3.5–5.1)
Sodium: 148 meq/L — ABNORMAL HIGH (ref 135–145)

## 2024-08-20 LAB — HEMOGLOBIN A1C: Hgb A1c MFr Bld: 6.2 % (ref 4.6–6.5)

## 2024-08-20 LAB — FERRITIN: Ferritin: 49.3 ng/mL (ref 10.0–291.0)

## 2024-08-20 LAB — VITAMIN B12: Vitamin B-12: 187 pg/mL — ABNORMAL LOW (ref 211–911)

## 2024-08-20 NOTE — Progress Notes (Signed)
 Patient ID: Madeline Mcdaniel, female   DOB: 1941/03/29, 83 y.o.   MRN: [id number]        Chief Complaint: follow up gait disorder, htn, hld, hyperglycemia, low vit d, anemia       HPI:  Madeline Mcdaniel is a 83 y.o. female here with husband with worsening general weakness and gait difficulty, more off balance recently but no falls.  Pt denies chest pain, increased sob or doe, wheezing, orthopnea, PND, increased LE swelling, palpitations, dizziness or syncope.   Pt denies polydipsia, polyuria, or new focal neuro s/s.    Pt denies fever, wt loss, night sweats, loss of appetite, or other constitutional symptoms   Due for tdap at pharmacy  No overt bleeding.  Wt Readings from Last 3 Encounters:  05/15/24 104 lb (47.2 kg)  02/18/24 104 lb (47.2 kg)  08/21/23 100 lb (45.4 kg)   BP Readings from Last 3 Encounters:  08/20/24 116/80  05/15/24 118/80  02/18/24 120/72         Past Medical History:  Diagnosis Date   Allergic rhinitis, cause unspecified 04/27/2011   Anemia 04/27/2011   Asthma 04/27/2011   Bipolar affective disorder (HCC) 04/27/2011   Cervical spondylosis 06/01/2012   Chronic bronchitis 04/27/2011   Chronic cystitis 04/27/2011   Colon polyps 04/27/2011   Degenerative joint disease 04/27/2011   GERD (gastroesophageal reflux disease) 04/27/2011   HTN (hypertension) 04/27/2011   Hyperlipidemia 04/27/2011   Mild cognitive impairment 04/12/2013   Osteopenia 06/01/2012   Recurrent UTI 04/27/2011   Right carpal tunnel syndrome 07/22/2012   TIA (transient ischemic attack) 04/27/2011   Urinary incontinence 04/27/2011   Past Surgical History:  Procedure Laterality Date   BLADDER SUSPENSION     A&P REPAIR WITH MESH   BREAST BIOPSY     BREAST LUMPECTOMY     SEVERAL TIMES   BUNIONECTOMY     LEFT FOOT   CHOLECYSTECTOMY     COLONOSCOPY  59YRS AGO   IN [place] ,PT DOES NOT REMEMBER DOCTOR   McBride Bunionectomy Left 09/09/2014   @ [facility]   NEURECTOMY FOOT Left 09/09/2014   @ [facility]    ORTHOSCOPIC CARPETOMY     BOTH THUMBS   POLYPECTOMY  10 YRS AGO   BENIGN POLYPS X2   STERIOTACTIC BREAST BIOPSY     RIGHT   TENDON TRANSFER  12/09/2012   Procedure: TENDON TRANSFER;  Surgeon: Arley JONELLE Curia, MD;  Location:  SURGERY CENTER;  Service: Orthopedics;  Laterality: Left;  Tenodesis PIP with FDS Slip Left ring finger, Juggerknot   TONSILLECTOMY     TOTAL ABDOMINAL HYSTERECTOMY     TRIGGER FINGER RELEASE     LEFT RING FINGER    reports that she has never smoked. She has never used smokeless tobacco. She reports that she does not drink alcohol and does not use drugs. family history includes Cancer (age of onset: 57) in her mother; Colon cancer in her paternal grandfather. Allergies  Allergen Reactions   Aspirin Anaphylaxis   Nsaids Anaphylaxis    BECAUSE OF REACTION TO VIOXX BECAUSE OF REACTION TO VIOXX   Other Anaphylaxis    BECAUSE OF REACTION TO VIOXX BECAUSE OF REACTION TO VIOXX BECAUSE OF REACTION TO VIOXX   Vioxx [Rofecoxib] Anaphylaxis   Ciprofloxacin Other (See Comments)    HEADACHE HEADACHE   No current outpatient medications on file prior to visit.   No current facility-administered medications on file prior to visit.  ROS:  All others reviewed and negative.  Objective        PE:  BP 116/80 (BP Location: Right Arm, Patient Position: Sitting, Cuff Size: Normal)   Pulse 65   Temp 98.1 F (36.7 C) (Oral)   Ht 4' 9.5 (1.461 m)   SpO2 97%   BMI 22.12 kg/m                 Constitutional: Pt appears in NAD               HENT: Head: NCAT.                Right Ear: External ear normal.                 Left Ear: External ear normal.                Eyes: . Pupils are equal, round, and reactive to light. Conjunctivae and EOM are normal               Nose: without d/c or deformity               Neck: Neck supple. Gross normal ROM               Cardiovascular: Normal rate and regular rhythm.                 Pulmonary/Chest: Effort normal and  breath sounds without rales or wheezing.                Abd:  Soft, NT, ND, + BS, no organomegaly               Neurological: Pt is alert. At baseline orientation, motor grossly intact               Skin: Skin is warm. No rashes, no other new lesions, LE edema - none               Psychiatric: Pt behavior is normal without agitation   Micro: none  Cardiac tracings I have personally interpreted today:  none  Pertinent Radiological findings (summarize): none   Lab Results  Component Value Date   WBC 7.6 08/20/2024   HGB 14.5 08/20/2024   HCT 44.1 08/20/2024   PLT 101.0 (L) 08/20/2024   GLUCOSE 93 08/20/2024   CHOL 172 08/20/2024   TRIG 144.0 08/20/2024   HDL 62.90 08/20/2024   LDLDIRECT 113.0 02/18/2023   LDLCALC 80 08/20/2024   ALT 10 08/20/2024   AST 13 08/20/2024   NA 148 (H) 08/20/2024   K 3.6 08/20/2024   CL 111 08/20/2024   CREATININE 0.71 08/20/2024   BUN 27 (H) 08/20/2024   CO2 29 08/20/2024   TSH 1.24 08/20/2024   HGBA1C 6.2 08/20/2024   Assessment/Plan:  Madeline Mcdaniel is a 83 y.o. White or Caucasian [1] female with  has a past medical history of Allergic rhinitis, cause unspecified (04/27/2011), Anemia (04/27/2011), Asthma (04/27/2011), Bipolar affective disorder (HCC) (04/27/2011), Cervical spondylosis (06/01/2012), Chronic bronchitis (04/27/2011), Chronic cystitis (04/27/2011), Colon polyps (04/27/2011), Degenerative joint disease (04/27/2011), GERD (gastroesophageal reflux disease) (04/27/2011), HTN (hypertension) (04/27/2011), Hyperlipidemia (04/27/2011), Mild cognitive impairment (04/12/2013), Osteopenia (06/01/2012), Recurrent UTI (04/27/2011), Right carpal tunnel syndrome (07/22/2012), TIA (transient ischemic attack) (04/27/2011), and Urinary incontinence (04/27/2011).  Vitamin D  deficiency Last vitamin D  Lab Results  Component Value Date   VD25OH 22.05 (L) 08/20/2024   Low, to start oral replacement   Hyperlipidemia Lab Results  Component Value Date   LDLCALC 80  08/20/2024   uncontrolled, pt continues to decline statin  Hyperglycemia Lab Results  Component Value Date   HGBA1C 6.2 08/20/2024   Stable, pt to continue current medical treatment  - diet, wt control  HTN (hypertension) BP Readings from Last 3 Encounters:  08/20/24 116/80  05/15/24 118/80  02/18/24 120/72   Stable, pt to continue medical treatment - diet, wt control  Anemia N o overt bleeding, for cbc with labs  Gait disorder With mild worsening  - for referral PT  B12 deficiency Lab Results  Component Value Date   VITAMINB12 187 (L) 08/20/2024   Low, to start oral replacement - b12 1000 mcg qd  Followup: Return in about 6 months (around 02/17/2025).  Lynwood Rush, MD 08/22/2024 6:40 PM [facility]

## 2024-08-20 NOTE — Patient Instructions (Addendum)
 Please have the Tdap tetanus shot at the pharmacy  Please continue all other medications as before, and refills have been done if requested.  Please have the pharmacy call with any other refills you may need.  Please continue your efforts at being more active, low cholesterol diet, and weight control.  You are otherwise up to date with prevention measures today.  Please keep your appointments with your specialists as you may have planned  You will be contacted regarding the referral for: Physical Therapy  Please go to the LAB at the blood drawing area for the tests to be done  You will be contacted by phone if any changes need to be made immediately.  Otherwise, you will receive a letter about your results with an explanation, but please check with MyChart first.  Please make an Appointment to return in 6 months, or sooner if needed

## 2024-08-21 ENCOUNTER — Other Ambulatory Visit: Payer: Self-pay | Admitting: Internal Medicine

## 2024-08-21 ENCOUNTER — Other Ambulatory Visit

## 2024-08-21 DIAGNOSIS — E7800 Pure hypercholesterolemia, unspecified: Secondary | ICD-10-CM

## 2024-08-21 LAB — URINALYSIS, ROUTINE W REFLEX MICROSCOPIC
Bilirubin Urine: NEGATIVE
Nitrite: POSITIVE — AB
Specific Gravity, Urine: 1.025 (ref 1.000–1.030)
Total Protein, Urine: NEGATIVE
Urine Glucose: NEGATIVE
Urobilinogen, UA: 0.2 (ref 0.0–1.0)
pH: 5.5 (ref 5.0–8.0)

## 2024-08-21 MED ORDER — CEPHALEXIN 500 MG PO CAPS: 500.0000 mg | ORAL_CAPSULE | Freq: Three times a day (TID) | ORAL | 0 refills | Status: DC

## 2024-08-22 ENCOUNTER — Encounter: Payer: Self-pay | Admitting: Internal Medicine

## 2024-08-22 DIAGNOSIS — R269 Unspecified abnormalities of gait and mobility: Secondary | ICD-10-CM | POA: Insufficient documentation

## 2024-08-22 NOTE — Assessment & Plan Note (Signed)
 N o overt bleeding, for cbc with labs

## 2024-08-22 NOTE — Assessment & Plan Note (Signed)
 BP Readings from Last 3 Encounters:  08/20/24 116/80  05/15/24 118/80  02/18/24 120/72   Stable, pt to continue medical treatment - diet, wt control

## 2024-08-22 NOTE — Assessment & Plan Note (Signed)
 Lab Results  Component Value Date   VITAMINB12 187 (L) 08/20/2024   Low, to start oral replacement - b12 1000 mcg qd

## 2024-08-22 NOTE — Assessment & Plan Note (Signed)
 Lab Results  Component Value Date   HGBA1C 6.2 08/20/2024   Stable, pt to continue current medical treatment  - diet, wt control

## 2024-08-22 NOTE — Assessment & Plan Note (Signed)
 Lab Results  Component Value Date   LDLCALC 80 08/20/2024   uncontrolled, pt continues to decline statin

## 2024-08-22 NOTE — Assessment & Plan Note (Signed)
 Last vitamin D  Lab Results  Component Value Date   VD25OH 22.05 (L) 08/20/2024   Low, to start oral replacement

## 2024-08-22 NOTE — Assessment & Plan Note (Signed)
 With mild worsening  - for referral PT

## 2024-09-25 ENCOUNTER — Other Ambulatory Visit: Payer: Self-pay | Admitting: Internal Medicine

## 2024-09-25 DIAGNOSIS — Z1231 Encounter for screening mammogram for malignant neoplasm of breast: Secondary | ICD-10-CM

## 2024-09-26 ENCOUNTER — Ambulatory Visit
Admission: RE | Admit: 2024-09-26 | Discharge: 2024-09-26 | Disposition: A | Discharge status: Home / Self Care | Source: Ambulatory Visit | Attending: Emergency Medicine | Admitting: Emergency Medicine

## 2024-09-26 VITALS — BP 125/78 | HR 67 | Temp 96.8°F | Resp 18

## 2024-09-26 DIAGNOSIS — Z87898 Personal history of other specified conditions: Secondary | ICD-10-CM | POA: Diagnosis not present

## 2024-09-26 DIAGNOSIS — F0390 Unspecified dementia without behavioral disturbance: Secondary | ICD-10-CM | POA: Insufficient documentation

## 2024-09-26 DIAGNOSIS — Z87440 Personal history of urinary (tract) infections: Secondary | ICD-10-CM | POA: Insufficient documentation

## 2024-09-26 DIAGNOSIS — R4182 Altered mental status, unspecified: Secondary | ICD-10-CM | POA: Diagnosis not present

## 2024-09-26 HISTORY — DX: Unspecified dementia, unspecified severity, without behavioral disturbance, psychotic disturbance, mood disturbance, and anxiety: F03.90

## 2024-09-26 LAB — POCT URINE DIPSTICK
Bilirubin, UA: NEGATIVE
Glucose, UA: NEGATIVE mg/dL
Ketones, POC UA: NEGATIVE mg/dL
Leukocytes, UA: NEGATIVE
Nitrite, UA: NEGATIVE
POC PROTEIN,UA: NEGATIVE
Spec Grav, UA: 1.025 (ref 1.010–1.025)
Urobilinogen, UA: 0.2 U/dL
pH, UA: 5.5 (ref 5.0–8.0)

## 2024-09-26 MED ORDER — CEFIXIME 400 MG PO CAPS: 400.0000 mg | ORAL_CAPSULE | Freq: Every day | ORAL | 0 refills | Status: AC

## 2024-09-26 MED ORDER — CEFTRIAXONE SODIUM 1 G IJ SOLR
1.0000 g | Freq: Once | INTRAMUSCULAR | Status: AC
  Administered 2024-09-26: 1 g via INTRAMUSCULAR

## 2024-09-26 NOTE — ED Provider Notes (Signed)
 [facility]    CSN: [id number] Arrival date & time: 09/26/24  9163    HISTORY   Chief Complaint  Patient presents with   Urinary Symptoms   Altered Mental Status   HPI Madeline Mcdaniel is a pleasant, 83 y.o. female who presents to urgent care today. Patient is here with her husband today who states she has been having cloudy urine for about a week.  He states she has been acting similar to how she was acting the last time she had a UTI.  Describes her as being out of her mind, doing things such as folding and unfolding paper, rubbing her hand together, and picking up an appropriate things and carrying them with her.  States she also went to the back gate of their yard and tried to leave.  States patient has dementia at baseline.  States that he gave her Tylenol  to help her sleep last night but she was very restless anyway.  States she completed a 10-day course of Keflex  500 mg 3 times daily prescribed for her on August 21, 2024.  Urine culture performed on September 19 was negative.    The history is provided by the spouse.  URI  Past Medical History:  Diagnosis Date   Allergic rhinitis, cause unspecified 04/27/2011   Anemia 04/27/2011   Asthma 04/27/2011   Bipolar affective disorder (HCC) 04/27/2011   Cervical spondylosis 06/01/2012   Chronic bronchitis 04/27/2011   Chronic cystitis 04/27/2011   Colon polyps 04/27/2011   Degenerative joint disease 04/27/2011   Dementia (HCC)    GERD (gastroesophageal reflux disease) 04/27/2011   HTN (hypertension) 04/27/2011   Hyperlipidemia 04/27/2011   Mild cognitive impairment 04/12/2013   Osteopenia 06/01/2012   Recurrent UTI 04/27/2011   Right carpal tunnel syndrome 07/22/2012   TIA (transient ischemic attack) 04/27/2011   Urinary incontinence 04/27/2011   Patient Active Problem List   Diagnosis Date Noted   Gait disorder 08/22/2024   B12 deficiency 02/19/2024   Right flank pain 08/21/2023   Community acquired  pneumonia of left lower lobe of lung 09/10/2022   Wheezing 04/21/2022   Hyperglycemia 04/21/2022   Closed left hip fracture, initial encounter (HCC) 04/15/2021   Nondisplaced intertrochanteric fracture of left femur, init (HCC) 04/15/2021   Constipation, unspecified 04/15/2021   Recurrent falls 01/17/2021   Vitamin D  deficiency 01/17/2021   Bipolar disorder, unspecified (HCC) 12/03/2020   Depression, unspecified 12/03/2020   Essential hypertension 12/03/2020   Gastroesophageal reflux disease without esophagitis 12/03/2020   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   Dementia without behavioral disturbance (HCC) 01/12/2019   Lumbar radiculopathy 01/06/2019   Left hip pain 12/22/2018   Memory dysfunction 01/30/2017   Urinary frequency 01/19/2016   Loss of weight 04/19/2015   Dyspnea 04/06/2015   Edema 12/28/2014   Onychomycosis 08/31/2014   Pain in lower limb 08/31/2014   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   Carpal tunnel syndrome, right 08/25/2014   Bilateral bunions 08/25/2014   Acute on chronic cholecystitis 06/16/2014   Left foot pain 08/25/2013   Fatigue 04/08/2013   Right carpal tunnel syndrome 07/22/2012   Brachial neuritis or radiculitis 07/01/2012   Trigger finger, left 07/01/2012   Osteopenia 06/01/2012   Cervical spondylosis 06/01/2012   Mild intermittent asthma 04/27/2011   Degenerative joint disease 04/27/2011   Bipolar affective disorder (HCC) 04/27/2011   Chronic bronchitis (HCC) 04/27/2011   GERD (gastroesophageal reflux disease) 04/27/2011   Allergic rhinitis 04/27/2011   HTN (hypertension) 04/27/2011  Chronic cystitis 04/27/2011   Colon polyps 04/27/2011   Anemia 04/27/2011   H/O: hysterectomy 04/27/2011   TIA (transient ischemic attack) 04/27/2011   Urinary incontinence 04/27/2011   Recurrent UTI 04/27/2011   Hyperlipidemia 04/27/2011   Preventative health care 04/27/2011   Past Surgical History:  Procedure Laterality  Date   BLADDER SUSPENSION     A&P REPAIR WITH MESH   BREAST BIOPSY     BREAST LUMPECTOMY     SEVERAL TIMES   BUNIONECTOMY     LEFT FOOT   CHOLECYSTECTOMY     COLONOSCOPY  2YRS AGO   IN [place] ,PT DOES NOT REMEMBER DOCTOR   [facility] Bunionectomy Left 09/09/2014   @ [facility]   NEURECTOMY FOOT Left 09/09/2014   @ [facility]   ORTHOSCOPIC CARPETOMY     BOTH THUMBS   POLYPECTOMY  10 YRS AGO   BENIGN POLYPS X2   STERIOTACTIC BREAST BIOPSY     RIGHT   TENDON TRANSFER  12/09/2012   Procedure: TENDON TRANSFER;  Surgeon: Arley JONELLE Curia, MD;  Location: [facility];  Service: Orthopedics;  Laterality: Left;  [place] PIP with FDS Slip Left ring finger, [place]   TONSILLECTOMY     TOTAL ABDOMINAL HYSTERECTOMY     TRIGGER FINGER RELEASE     LEFT RING FINGER   OB History     Gravida  2   Para      Term      Preterm      AB      Living         SAB      IAB      Ectopic      Multiple      Live Births  2          Home Medications    Prior to Admission medications   Medication Sig Start Date End Date Taking? Authorizing Provider  cephALEXin  (KEFLEX ) 500 MG capsule Take 1 capsule (500 mg total) by mouth 3 (three) times daily. 08/21/24   Norleen Lynwood ORN, MD    Family History Family History  Problem Relation Age of Onset   Cancer Mother 68       RENAL CELL CARCINOMA   Colon cancer Paternal Grandfather    Breast cancer Neg Hx    Social History Social History   Tobacco Use   Smoking status: Never    Passive exposure: Past   Smokeless tobacco: Never  Vaping Use   Vaping status: Never Used  Substance Use Topics   Alcohol use: No   Drug use: No   Allergies   Aspirin, Nsaids, Other, Vioxx [rofecoxib], and Ciprofloxacin  Review of Systems Review of Systems Pertinent findings revealed after performing a 14 point review of systems has been noted in the history of present illness.  Physical Exam Vital Signs BP 125/78 (BP Location: Right  Arm)   Pulse 67   Temp (!) 96.8 F (36 C) (Temporal)   Resp 18   SpO2 95%   No data found.  Physical Exam Vitals and nursing note reviewed.  Constitutional:      General: She is awake. She is not in acute distress.    Appearance: Normal appearance. She is well-developed and well-groomed. She is not ill-appearing.  Abdominal:     General: Abdomen is flat. Bowel sounds are normal.     Palpations: Abdomen is soft.     Tenderness: There is no abdominal tenderness.  Neurological:  Mental Status: Mental status is at baseline. She is disoriented and confused.     Gait: Gait is intact.  Psychiatric:        Attention and Perception: Attention normal.        Mood and Affect: Mood and affect normal.        Speech: Speech normal.        Behavior: Behavior normal. Behavior is cooperative.        Thought Content: Thought content normal.        Cognition and Memory: Cognition is impaired. Memory is impaired.     Visual Acuity Right Eye Distance:   Left Eye Distance:   Bilateral Distance:    Right Eye Near:   Left Eye Near:    Bilateral Near:     UC Couse / Diagnostics / Procedures:     Radiology No results found.  Procedures Procedures (including critical care time) EKG  Pending results:  Labs Reviewed  POCT URINE DIPSTICK - Abnormal; Notable for the following components:      Result Value   Blood, UA trace-intact (*)    All other components within normal limits  URINE CULTURE    Medications Ordered in UC: Medications  cefTRIAXone (ROCEPHIN) injection 1 g (1 g Intramuscular Given 09/26/24 0939)    UC Diagnoses / Final Clinical Impressions(s)   I have reviewed the triage vital signs and the nursing notes.  Pertinent labs & imaging results that were available during my care of the patient were reviewed by me and considered in my medical decision making (see chart for details).    Final diagnoses:  Altered mental status, unspecified altered mental status type   History of chronic urinary tract infection  History of urinary retention  Dementia without behavioral disturbance, psychotic disturbance, mood disturbance, or anxiety, unspecified dementia severity, unspecified dementia type (HCC)   Urine culture did reveal trace RBCs.  Based on history provided by patient's husband and history of frequent urinary tract infections warranting follow-up with urology, will treat patient for presumed recurrent, complicated urinary tract infection with injection of ceftriaxone during visit today and a 7-day course of Suprax for broader spectrum coverage.  Will adjust antibiotic therapy based on results of urine culture which is still pending at this time.  Please see discharge instructions below for details of plan of care as provided to patient. ED Prescriptions     Medication Sig Dispense Auth. Provider   cefixime (SUPRAX) 400 MG CAPS capsule Take 1 capsule (400 mg total) by mouth daily for 7 days. 7 capsule Joesph Shaver Scales, PA-C      PDMP not reviewed this encounter.  Pending results:  Labs Reviewed  POCT URINE DIPSTICK - Abnormal; Notable for the following components:      Result Value   Blood, UA trace-intact (*)    All other components within normal limits  URINE CULTURE      Discharge Instructions      The urinalysis we performed today did show blood in the urine which is concerning for possible urinary tract infection.  As a matter protocol, we will perform a urine culture which may take a day or 2 to complete.  We will notify you of the results once we receive them.  Because your wife has not been herself as of late and has exhibited the same symptoms when dealing with the urinary tract infection in the past, we have decided to proceed with full treatment for presumed urinary tract infection.  During  her visit today she was provided with an injection of an antibiotic called ceftriaxone (Rocephin) for more immediate relief of her current  symptoms.  I would like for her to also begin taking an oral antibiotic called cefixime (Suprax) for the next 7 days.  She will take 1 dose daily.  If symptoms do not meaningfully improve despite these treatments, please reach out to her urologist office to schedule a follow-up appointment.  Thank you for visiting [facility] today.  It was a pleasure to meet you both and we appreciate the opportunity to participate in your wife's care.        Disposition Upon Discharge:  Condition: stable for discharge home  Patient presented with an acute illness with associated systemic symptoms and significant discomfort requiring urgent management. In my opinion, this is a condition that a prudent lay person (someone who possesses an average knowledge of health and medicine) may potentially expect to result in complications if not addressed urgently such as respiratory distress, impairment of bodily function or dysfunction of bodily organs.   Routine symptom specific, illness specific and/or disease specific instructions were discussed with the patient and/or caregiver at length.   As such, the patient has been evaluated and assessed, work-up was performed and treatment was provided in alignment with urgent care protocols and evidence based medicine.  Patient/parent/caregiver has been advised that the patient may require follow up for further testing and treatment if the symptoms continue in spite of treatment, as clinically indicated and appropriate.  Patient/parent/caregiver has been advised to return to the [facility] or PCP if no better; to PCP or the Emergency Department if new signs and symptoms develop, or if the current signs or symptoms continue to change or worsen for further workup, evaluation and treatment as clinically indicated and appropriate  The patient will follow up with their current PCP if and as advised. If the patient does not currently have a PCP we will assist them in  obtaining one.   The patient may need specialty follow up if the symptoms continue, in spite of conservative treatment and management, for further workup, evaluation, consultation and treatment as clinically indicated and appropriate.  Patient/parent/caregiver verbalized understanding and agreement of plan as discussed.  All questions were addressed during visit.  Please see discharge instructions below for further details of plan.  This office note has been dictated using Teaching laboratory technician.  Unfortunately, this method of dictation can sometimes lead to typographical or grammatical errors.  I apologize for your inconvenience in advance if this occurs.  Please do not hesitate to reach out to me if clarification is needed.      Joesph Shaver Scales, PA-C 09/27/24 1215

## 2024-09-26 NOTE — Discharge Instructions (Addendum)
 The urinalysis we performed today did show blood in the urine which is concerning for possible urinary tract infection.  As a matter protocol, we will perform a urine culture which may take a day or 2 to complete.  We will notify you of the results once we receive them.  Because your wife has not been herself as of late and has exhibited the same symptoms when dealing with the urinary tract infection in the past, we have decided to proceed with full treatment for presumed urinary tract infection.  During her visit today she was provided with an injection of an antibiotic called ceftriaxone (Rocephin) for more immediate relief of her current symptoms.  I would like for her to also begin taking an oral antibiotic called cefixime (Suprax) for the next 7 days.  She will take 1 dose daily.  If symptoms do not meaningfully improve despite these treatments, please reach out to her urologist office to schedule a follow-up appointment.  Thank you for visiting [facility] today.  It was a pleasure to meet you both and we appreciate the opportunity to participate in your wife's care.

## 2024-09-26 NOTE — ED Triage Notes (Signed)
 Her husband states she has been having cloudy urine for about a week.   She is acting similar to how she was acting the last time she had a UTI. She acted out of her mind last time doing things such as folding paper, rubbing her hands, and picking up things to bring with her. She also went to the back gate and tried to get out of the gate.     She did take a Tylenol . Even in her sleep she was very restless.  She did take a leftover 30 days day dose of Keflex  500 mg and completed the whole course.

## 2024-09-26 NOTE — ED Notes (Signed)
 She was sitting in the floor yesterday. The husband is unsure if she fell or not. She did have a fall in September. She did not have any injuries, hit her head, or lose consciousness. She landed on her knees and her hands.

## 2024-09-27 LAB — URINE CULTURE: Culture: NO GROWTH

## 2024-09-27 NOTE — Therapy (Signed)
 OUTPATIENT PHYSICAL THERAPY NEURO EVALUATION   Patient Name: Madeline Mcdaniel MRN: [id number] DOB:1940/12/18, 83 y.o., female Today's Date: 09/28/2024   END OF SESSION:  PT End of Session - 09/28/24 1443     Visit Number 1    Date for Recertification  11/23/24    Authorization Type MCR + supplement          Past Medical History:  Diagnosis Date   Allergic rhinitis, cause unspecified 04/27/2011   Anemia 04/27/2011   Asthma 04/27/2011   Bipolar affective disorder (HCC) 04/27/2011   Cervical spondylosis 06/01/2012   Chronic bronchitis 04/27/2011   Chronic cystitis 04/27/2011   Colon polyps 04/27/2011   Degenerative joint disease 04/27/2011   Dementia (HCC)    GERD (gastroesophageal reflux disease) 04/27/2011   HTN (hypertension) 04/27/2011   Hyperlipidemia 04/27/2011   Mild cognitive impairment 04/12/2013   Osteopenia 06/01/2012   Recurrent UTI 04/27/2011   Right carpal tunnel syndrome 07/22/2012   TIA (transient ischemic attack) 04/27/2011   Urinary incontinence 04/27/2011   Past Surgical History:  Procedure Laterality Date   BLADDER SUSPENSION     A&P REPAIR WITH MESH   BREAST BIOPSY     BREAST LUMPECTOMY     SEVERAL TIMES   BUNIONECTOMY     LEFT FOOT   CHOLECYSTECTOMY     COLONOSCOPY  33YRS AGO   IN [place] ,PT DOES NOT REMEMBER DOCTOR   [facility]ride Bunionectomy Left 09/09/2014   @ [facility]   NEURECTOMY FOOT Left 09/09/2014   @ [facility]   ORTHOSCOPIC CARPETOMY     BOTH THUMBS   POLYPECTOMY  10 YRS AGO   BENIGN POLYPS X2   STERIOTACTIC BREAST BIOPSY     RIGHT   TENDON TRANSFER  12/09/2012   Procedure: TENDON TRANSFER;  Surgeon: Arley JONELLE Curia, MD;  Location: [facility];  Service: Orthopedics;  Laterality: Left;  [place] PIP with FDS Slip Left ring finger, [place]   TONSILLECTOMY     TOTAL ABDOMINAL HYSTERECTOMY     TRIGGER FINGER RELEASE     LEFT RING FINGER   Patient Active Problem List   Diagnosis Date Noted   Gait disorder  08/22/2024   B12 deficiency 02/19/2024   Right flank pain 08/21/2023   Community acquired pneumonia of left lower lobe of lung 09/10/2022   Wheezing 04/21/2022   Hyperglycemia 04/21/2022   Closed left hip fracture, initial encounter (HCC) 04/15/2021   Nondisplaced intertrochanteric fracture of left femur, init (HCC) 04/15/2021   Constipation, unspecified 04/15/2021   Recurrent falls 01/17/2021   Vitamin D  deficiency 01/17/2021   Bipolar disorder, unspecified (HCC) 12/03/2020   Depression, unspecified 12/03/2020   Essential hypertension 12/03/2020   Gastroesophageal reflux disease without esophagitis 12/03/2020   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   Dementia without behavioral disturbance (HCC) 01/12/2019   Lumbar radiculopathy 01/06/2019   Left hip pain 12/22/2018   Memory dysfunction 01/30/2017   Urinary frequency 01/19/2016   Loss of weight 04/19/2015   Dyspnea 04/06/2015   Edema 12/28/2014   Onychomycosis 08/31/2014   Pain in lower limb 08/31/2014   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   Carpal tunnel syndrome, right 08/25/2014   Bilateral bunions 08/25/2014   Acute on chronic cholecystitis 06/16/2014   Left foot pain 08/25/2013   Fatigue 04/08/2013   Right carpal tunnel syndrome 07/22/2012   Brachial neuritis or radiculitis 07/01/2012   Trigger finger, left 07/01/2012   Osteopenia 06/01/2012   Cervical spondylosis 06/01/2012   Mild intermittent  asthma 04/27/2011   Degenerative joint disease 04/27/2011   Bipolar affective disorder (HCC) 04/27/2011   Chronic bronchitis (HCC) 04/27/2011   GERD (gastroesophageal reflux disease) 04/27/2011   Allergic rhinitis 04/27/2011   HTN (hypertension) 04/27/2011   Chronic cystitis 04/27/2011   Colon polyps 04/27/2011   Anemia 04/27/2011   H/O: hysterectomy 04/27/2011   TIA (transient ischemic attack) 04/27/2011   Urinary incontinence 04/27/2011   Recurrent UTI 04/27/2011   Hyperlipidemia  04/27/2011   Preventative health care 04/27/2011    PCP: Norleen Lynwood ORN, MD   REFERRING PROVIDER: Norleen Lynwood ORN, MD   REFERRING DIAG: R26.9 (ICD-10-CM) - Gait disorder R53.1 (ICD-10-CM) - General weakness  THERAPY DIAG:  Difficulty in walking, not elsewhere classified  RATIONALE FOR EVALUATION AND TREATMENT: Rehabilitation  ONSET DATE: at onset of UTI's per spouse  NEXT MD VISIT:    SUBJECTIVE:                                                                                                                                                                                                         SUBJECTIVE STATEMENT: 83 y/o patient referred from Dr Norleen for gait disorder.   Patient has recurrent UTI's and spouse states she gets wild and unsteady when she gets a UTI.   In fact she is on PO ABX right now for a new UTI dx'd 2 days ago at urgent care.   Spouse reports that once the UTI clears she is ok with ambulation; or at least as ok as possible for her .   Patient has dementia with constant disorientation and confusion.    She is pleasantly confused throughout today's visit and all history is obtained from spouse.   He provides 24/7 care for her and manages all of her meds, meals, ADLs and transportation.   He reports she is usually very pleasant and cooperative and that she has no recent falls.  He states she does not wander unless she gets a UTI.   He reports the home is handicapped accessible with shower chair, multple grab bars, removable shower head, etc.   Spouse is not exactly sure what necessitated the PT referral unless patient was unsteady and had a UTI at her last office visit.    Spouse does not feel she has declined in function per se.   He provides hand held assistance for safety and direction whenever they are in the community.   HE does not allow her walk unassisted out of the home at any time.  Spouse reports they have an upstairs at  home, but the patient does not have to go  upstairs for any reason.   He has an alarm on their stairs so that the patient cannot get on them.   He states the patient fell several years ago and had a L hip fx, but nothing since that time.  He feels just a few PT visits will be enough.  Pt accompanied by: spouse  PAIN: Are you having pain? Yes: NPRS scale: denies any pain Pain location: no pain Pain description: no pain Aggravating factors: no pain Relieving factors: same  PERTINENT HISTORY:  Anemia, asthma, bipolar d/o, dementia, HTN, osteopenia, chronic cystitis w/ recurrent UTI's  PRECAUTIONS: Fall  RED FLAGS: None  WEIGHT BEARING RESTRICTIONS: No  FALLS:  Has patient fallen in last 6 months? No  LIVING ENVIRONMENT: Lives with: lives with their spouse Lives in: House/apartment Stairs: Yes: External: 1 steps; none Has following equipment at home: None  OCCUPATION: retired [employer]  PLOF: supervision for all mobility  PATIENT GOALS: patient cannot voice a goal   OBJECTIVE: (objective measures completed at initial evaluation unless otherwise dated)  DIAGNOSTIC FINDINGS:  N/A  COGNITION: Overall cognitive status: dementia and at baseline   SENSATION: WFL  COORDINATION: No issues noted  EDEMA:  None noted  MUSCLE TONE: WNL  DTRs:  NT  POSTURE:  Mild thoracic kyphosis and forward head posture    LOWER EXTREMITY ROM:     Active  Right eval Left eval  Hip flexion    Hip extension    Hip abduction    Hip adduction    Hip internal rotation    Hip external rotation    Knee flexion    Knee extension    Ankle dorsiflexion    Ankle plantarflexion    Ankle inversion    Ankle eversion     (Blank rows = not tested)  LOWER EXTREMITY MMT:  Patient has difficulty following instructions for formal MMT, but is able to partly complete  MMT Right eval Left eval  Hip flexion 4+ 4+  Hip extension    Hip abduction    Hip adduction    Hip internal rotation    Hip external rotation    Knee flexion     Knee extension 5 5  Ankle dorsiflexion    Ankle plantarflexion    Ankle inversion    Ankle eversion    (Blank rows = not tested)  BED MOBILITY:  Sit to supine SBA Supine to sit SBA  TRANSFERS: Assistive device utilized: None  Sit to stand: SBA Stand to sit: SBA Chair to chair: SBA Floor: NT  GAIT: Distance walked: into clinic 200' and in clinic x 100' Assistive device utilized: holds spouse's hand Level of assistance: SBA Gait pattern: decreased step length- Right and decreased step length- Left Comments: short steps with minimal heel strike BLE   FUNCTIONAL TESTS:  patient cannot complete formal balance tests due to unable to completely follow all commands or multi step instructions Gait speed = 1.35 ft/sec  5X STS = 22 sec    PATIENT SURVEYS:  ABC scale: The Activities-Specific Balance Confidence (ABC) Scale 0% 10 20 30  40 50 60 70 80 90 100% No confidence<->completely confident  "How confident are you that you will not lose your balance or become unsteady when you . . .   Date tested 09/28/2024  Walk around the house 50%  2. Walk up or down stairs 10%  3. Bend over and pick up a slipper from in front  of a closet floor 5%  4. Reach for a small can off a shelf at eye level 10%  5. Stand on tip toes and reach for something above your head 0%  6. Stand on a chair and reach for something 0%  7. Sweep the floor 0%  8. Walk outside the house to a car parked in the driveway 20%  9. Get into or out of a car 5%  10. Walk across a parking lot to the mall 5%  11. Walk up or down a ramp 15%  12. Walk in a crowded mall where people rapidly walk past you 5%  13. Are bumped into by people as you walk through the mall 5%  14. Step onto or off of an escalator while you are holding onto the railing 0%  15. Step onto or off an escalator while holding onto parcels such that you cannot hold onto the railing 0%  16. Walk outside on icy sidewalks 0%  Total: #/16 130/1600       TODAY'S TREATMENT:  09/28/24 SELF CARE: Provided education on PT POC progression and to promote safe home environment--recommended grab bar on door frame at entry to home; personal alarm PRN;   initial HEP   PATIENT EDUCATION:  Education details: PT eval findings, anticipated POC, and initial HEP  Person educated: Spouse Education method: Explanation, Demonstration, Verbal cues, Tactile cues, and Handouts Education comprehension: verbalized understanding, verbal cues required, tactile cues required, and needs further education  HOME EXERCISE PROGRAM: Access Code: [email or web address] URL: [email or web address] Date: 09/28/2024 Prepared by: Garnette Montclair  Exercises - Braided Sidestepping  - 1 x daily - 7 x weekly - 3 sets - 10 reps - Heel Raises with Counter Support  - 1 x daily - 7 x weekly - 3 sets - 10 reps - Heel Toe Raises with Counter Support  - 1 x daily - 7 x weekly - 3 sets - 10 reps - Mini Squat with Counter Support  - 1 x daily - 7 x weekly - 3 sets - 10 reps - Standing March with Counter Support  - 1 x daily - 7 x weekly - 3 sets - 10 reps   ASSESSMENT:  CLINICAL IMPRESSION: AXIE HAYNE is a 83 y.o. female who was referred to physical therapy for evaluation and treatment for gait disorder.  Patient presents with physical impairments of impaired activity tolerance, impaired standing balance, impaired ambulation, and decreased safety awareness impacting safe and independent functional mobility.  Examination revealed patient is at risk for falls and functional decline as evidenced by the following objective test measures: Gait speed 1.35 ft/sec, (2.62 ft/sec is needed for community access)  5xSTS of 22.3 sec (>15 sec indicates increased risk for falls and decreased BLE power).  ABC scale score of 8% indicates a low level of physical functioning.  [place] will benefit from skilled PT to address above deficits to improve mobility and activity tolerance to help reach the  maximal level of functional independence and mobility. Patient demonstrates understanding of this POC and is in agreement with this plan.   OBJECTIVE IMPAIRMENTS: difficulty walking.   ACTIVITY LIMITATIONS: carrying, lifting, squatting, stairs, and locomotion level  PARTICIPATION LIMITATIONS: community activity  PERSONAL FACTORS: Age, Fitness, Time since onset of injury/illness/exacerbation, and 1-2 comorbidities: Anemia, asthma, bipolar d/o, dementia, HTN, osteopenia, chronic cystitis w/ recurrent UTI's are also affecting patient's functional outcome.   REHAB POTENTIAL: Good  CLINICAL DECISION MAKING: Evolving/moderate complexity  EVALUATION COMPLEXITY: Moderate   GOALS:  Goals reviewed with patient? Yes  SHORT TERM GOALS: Target date: 10/12/2024   Patient will be independent with initial HEP to improve outcomes and carryover.  Baseline: 100% PT assist required for correct completion Goal status: INITIAL  2.  Patient will be educated on strategies to decrease risk of falls.  Baseline: no education yet Goal status: IN PROGRESS  3.  Patient will demonstrate increased gait speed to >/=  1.75 ft/sec to decrease risk for falls with transitional mobility. Baseline: 1.35 ft/sec Goal status: INITIAL  LONG TERM GOALS: Target date: 10/26/2024   Patient will be independent with advanced/ongoing HEP to facilitate ability to maintain/progress functional gains from skilled physical therapy services. Baseline: no advanced HEP yet Goal status: INITIAL   4.  Patient will demonstrate improved BLE strength to >/= 5/5 for improved stability and ease of mobility . Baseline: Refer to above LE MMT table Goal status: INITIAL  5.  Patient will improve 5xSTS time to </= 16 seconds for improved efficiency and safety with transfers. Baseline: 22.30 sec Goal status: INITIAL   6.  Patient will demonstrate gait speed of >/= 2.00 ft/sec (0.55 m/s) to be a safe limited community ambulator with  decreased risk for recurrent falls.  Baseline: 1.35 ft/sec Goal status: INITIAL  10.  Patient will report >/= 27% on ABC scale (MCID = 19%) to demonstrate improved balance confidence with functional mobility and gait. Baseline: 8% Goal status: INITIAL   PLAN:  PT FREQUENCY: 1-2x/week  PT DURATION: 8 weeks  PLANNED INTERVENTIONS: [id number]- PT Re-evaluation, 97750- Physical Performance Testing, 97110-Therapeutic exercises, 97530- Therapeutic activity, [id number]- Neuromuscular re-education, 97535- Self Care, [id number]- Manual therapy, [phone]- Gait training, Patient/Family education, Balance training, Stair training, and Taping  PLAN FOR NEXT SESSION: Continue PT x 3 more visits for HEP and exercise instructions/tips and balance training   Gumaro Brightbill, PT 09/28/2024, 8:16 PM

## 2024-09-28 ENCOUNTER — Ambulatory Visit: Attending: Internal Medicine | Admitting: Rehabilitation

## 2024-09-28 ENCOUNTER — Other Ambulatory Visit: Payer: Self-pay

## 2024-09-28 ENCOUNTER — Encounter: Payer: Self-pay | Admitting: Rehabilitation

## 2024-09-28 DIAGNOSIS — R531 Weakness: Secondary | ICD-10-CM | POA: Diagnosis not present

## 2024-09-28 DIAGNOSIS — R269 Unspecified abnormalities of gait and mobility: Secondary | ICD-10-CM | POA: Insufficient documentation

## 2024-09-28 DIAGNOSIS — R262 Difficulty in walking, not elsewhere classified: Secondary | ICD-10-CM | POA: Insufficient documentation

## 2024-10-06 ENCOUNTER — Ambulatory Visit: Attending: Internal Medicine

## 2024-10-06 DIAGNOSIS — R262 Difficulty in walking, not elsewhere classified: Secondary | ICD-10-CM | POA: Diagnosis present

## 2024-10-06 NOTE — Therapy (Addendum)
 OUTPATIENT PHYSICAL THERAPY NEURO TREATEMNET   Patient Name: Madeline Mcdaniel MRN: [id number] DOB:08/17/1941, 83 y.o., female Today's Date: 10/06/2024   END OF SESSION:  PT End of Session - 10/06/24 1400     Visit Number 2    Date for Recertification  11/23/24    Authorization Type MCR + supplement    PT Start Time 1316    PT Stop Time 1400    PT Time Calculation (min) 44 min           Past Medical History:  Diagnosis Date   Allergic rhinitis, cause unspecified 04/27/2011   Anemia 04/27/2011   Asthma 04/27/2011   Bipolar affective disorder (HCC) 04/27/2011   Cervical spondylosis 06/01/2012   Chronic bronchitis 04/27/2011   Chronic cystitis 04/27/2011   Colon polyps 04/27/2011   Degenerative joint disease 04/27/2011   Dementia (HCC)    GERD (gastroesophageal reflux disease) 04/27/2011   HTN (hypertension) 04/27/2011   Hyperlipidemia 04/27/2011   Mild cognitive impairment 04/12/2013   Osteopenia 06/01/2012   Recurrent UTI 04/27/2011   Right carpal tunnel syndrome 07/22/2012   TIA (transient ischemic attack) 04/27/2011   Urinary incontinence 04/27/2011   Past Surgical History:  Procedure Laterality Date   BLADDER SUSPENSION     A&P REPAIR WITH MESH   BREAST BIOPSY     BREAST LUMPECTOMY     SEVERAL TIMES   BUNIONECTOMY     LEFT FOOT   CHOLECYSTECTOMY     COLONOSCOPY  59YRS AGO   IN [place] ,PT DOES NOT REMEMBER DOCTOR   [facility]ride Bunionectomy Left 09/09/2014   @ [facility]   NEURECTOMY FOOT Left 09/09/2014   @ [facility]   ORTHOSCOPIC CARPETOMY     BOTH THUMBS   POLYPECTOMY  10 YRS AGO   BENIGN POLYPS X2   STERIOTACTIC BREAST BIOPSY     RIGHT   TENDON TRANSFER  12/09/2012   Procedure: TENDON TRANSFER;  Surgeon: Arley JONELLE Curia, MD;  Location: [facility];  Service: Orthopedics;  Laterality: Left;  [place] PIP with FDS Slip Left ring finger, [place]   TONSILLECTOMY     TOTAL ABDOMINAL HYSTERECTOMY     TRIGGER FINGER RELEASE     LEFT RING  FINGER   Patient Active Problem List   Diagnosis Date Noted   Gait disorder 08/22/2024   B12 deficiency 02/19/2024   Right flank pain 08/21/2023   Community acquired pneumonia of left lower lobe of lung 09/10/2022   Wheezing 04/21/2022   Hyperglycemia 04/21/2022   Closed left hip fracture, initial encounter (HCC) 04/15/2021   Nondisplaced intertrochanteric fracture of left femur, init (HCC) 04/15/2021   Constipation, unspecified 04/15/2021   Recurrent falls 01/17/2021   Vitamin D  deficiency 01/17/2021   Bipolar disorder, unspecified (HCC) 12/03/2020   Depression, unspecified 12/03/2020   Essential hypertension 12/03/2020   Gastroesophageal reflux disease without esophagitis 12/03/2020   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   Dementia without behavioral disturbance (HCC) 01/12/2019   Lumbar radiculopathy 01/06/2019   Left hip pain 12/22/2018   Memory dysfunction 01/30/2017   Urinary frequency 01/19/2016   Loss of weight 04/19/2015   Dyspnea 04/06/2015   Edema 12/28/2014   Onychomycosis 08/31/2014   Pain in lower limb 08/31/2014   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   Carpal tunnel syndrome, right 08/25/2014   Bilateral bunions 08/25/2014   Acute on chronic cholecystitis 06/16/2014   Left foot pain 08/25/2013   Fatigue 04/08/2013   Right carpal tunnel syndrome 07/22/2012  Brachial neuritis or radiculitis 07/01/2012   Trigger finger, left 07/01/2012   Osteopenia 06/01/2012   Cervical spondylosis 06/01/2012   Mild intermittent asthma 04/27/2011   Degenerative joint disease 04/27/2011   Bipolar affective disorder (HCC) 04/27/2011   Chronic bronchitis (HCC) 04/27/2011   GERD (gastroesophageal reflux disease) 04/27/2011   Allergic rhinitis 04/27/2011   HTN (hypertension) 04/27/2011   Chronic cystitis 04/27/2011   Colon polyps 04/27/2011   Anemia 04/27/2011   H/O: hysterectomy 04/27/2011   TIA (transient ischemic attack) 04/27/2011    Urinary incontinence 04/27/2011   Recurrent UTI 04/27/2011   Hyperlipidemia 04/27/2011   Preventative health care 04/27/2011    PCP: Norleen Lynwood ORN, MD   REFERRING PROVIDER: Norleen Lynwood ORN, MD   REFERRING DIAG: R26.9 (ICD-10-CM) - Gait disorder R53.1 (ICD-10-CM) - General weakness  THERAPY DIAG:  Difficulty in walking, not elsewhere classified  RATIONALE FOR EVALUATION AND TREATMENT: Rehabilitation  ONSET DATE: at onset of UTI's per spouse  NEXT MD VISIT:    SUBJECTIVE:                                                                                                                                                                                                         SUBJECTIVE STATEMENT: Pt and husband notes HEP compliance, no recent falls   83 y/o patient referred from Dr Norleen for gait disorder.   Patient has recurrent UTI's and spouse states she gets wild and unsteady when she gets a UTI.   In fact she is on PO ABX right now for a new UTI dx'd 2 days ago at urgent care.   Spouse reports that once the UTI clears she is ok with ambulation; or at least as ok as possible for her .   Patient has dementia with constant disorientation and confusion.    She is pleasantly confused throughout today's visit and all history is obtained from spouse.   He provides 24/7 care for her and manages all of her meds, meals, ADLs and transportation.   He reports she is usually very pleasant and cooperative and that she has no recent falls.  He states she does not wander unless she gets a UTI.   He reports the home is handicapped accessible with shower chair, multple grab bars, removable shower head, etc.   Spouse is not exactly sure what necessitated the PT referral unless patient was unsteady and had a UTI at her last office visit.    Spouse does not feel she has declined in function per se.   He provides hand held  assistance for safety and direction whenever they are in the community.   HE does not allow  her walk unassisted out of the home at any time.  Spouse reports they have an upstairs at home, but the patient does not have to go upstairs for any reason.   He has an alarm on their stairs so that the patient cannot get on them.   He states the patient fell several years ago and had a L hip fx, but nothing since that time.  He feels just a few PT visits will be enough.  Pt accompanied by: spouse  PAIN: Are you having pain? Yes: NPRS scale: denies any pain Pain location: no pain Pain description: no pain Aggravating factors: no pain Relieving factors: same  PERTINENT HISTORY:  Anemia, asthma, bipolar d/o, dementia, HTN, osteopenia, chronic cystitis w/ recurrent UTI's  PRECAUTIONS: Fall  RED FLAGS: None  WEIGHT BEARING RESTRICTIONS: No  FALLS:  Has patient fallen in last 6 months? No  LIVING ENVIRONMENT: Lives with: lives with their spouse Lives in: House/apartment Stairs: Yes: External: 1 steps; none Has following equipment at home: None  OCCUPATION: retired [employer]  PLOF: supervision for all mobility  PATIENT GOALS: patient cannot voice a goal   OBJECTIVE: (objective measures completed at initial evaluation unless otherwise dated)  DIAGNOSTIC FINDINGS:  N/A  COGNITION: Overall cognitive status: dementia and at baseline   SENSATION: WFL  COORDINATION: No issues noted  EDEMA:  None noted  MUSCLE TONE: WNL  DTRs:  NT  POSTURE:  Mild thoracic kyphosis and forward head posture    LOWER EXTREMITY ROM:     Active  Right eval Left eval  Hip flexion    Hip extension    Hip abduction    Hip adduction    Hip internal rotation    Hip external rotation    Knee flexion    Knee extension    Ankle dorsiflexion    Ankle plantarflexion    Ankle inversion    Ankle eversion     (Blank rows = not tested)  LOWER EXTREMITY MMT:  Patient has difficulty following instructions for formal MMT, but is able to partly complete  MMT Right eval Left eval  Hip flexion  4+ 4+  Hip extension    Hip abduction    Hip adduction    Hip internal rotation    Hip external rotation    Knee flexion    Knee extension 5 5  Ankle dorsiflexion    Ankle plantarflexion    Ankle inversion    Ankle eversion    (Blank rows = not tested)  BED MOBILITY:  Sit to supine SBA Supine to sit SBA  TRANSFERS: Assistive device utilized: None  Sit to stand: SBA Stand to sit: SBA Chair to chair: SBA Floor: NT  GAIT: Distance walked: into clinic 200' and in clinic x 100' Assistive device utilized: holds spouse's hand Level of assistance: SBA Gait pattern: decreased step length- Right and decreased step length- Left Comments: short steps with minimal heel strike BLE   FUNCTIONAL TESTS:  patient cannot complete formal balance tests due to unable to completely follow all commands or multi step instructions Gait speed = 1.35 ft/sec  5X STS = 22 sec    PATIENT SURVEYS:  ABC scale: The Activities-Specific Balance Confidence (ABC) Scale 0% 10 20 30  40 50 60 70 80 90 100% No confidence<->completely confident  "How confident are you that you will not lose your balance or become unsteady when  you . . .   Date tested 10/06/2024  Walk around the house 50%  2. Walk up or down stairs 10%  3. Bend over and pick up a slipper from in front of a closet floor 5%  4. Reach for a small can off a shelf at eye level 10%  5. Stand on tip toes and reach for something above your head 0%  6. Stand on a chair and reach for something 0%  7. Sweep the floor 0%  8. Walk outside the house to a car parked in the driveway 20%  9. Get into or out of a car 5%  10. Walk across a parking lot to the mall 5%  11. Walk up or down a ramp 15%  12. Walk in a crowded mall where people rapidly walk past you 5%  13. Are bumped into by people as you walk through the mall 5%  14. Step onto or off of an escalator while you are holding onto the railing 0%  15. Step onto or off an escalator while holding  onto parcels such that you cannot hold onto the railing 0%  16. Walk outside on icy sidewalks 0%  Total: #/16 130/1600      TODAY'S TREATMENT:  10/06/24 Standing heel raises 2x10 Standing hip abduction x 10 B Standing marching x 10 B Standing mini squat x 10 Forward steps over 1lb weight x 10; lateral steps x 10 Seated LAQ BLE x 12  Sit to stand 2 x 10 Sidesteps 1lb each leg along counter  09/28/24 SELF CARE: Provided education on PT POC progression and to promote safe home environment--recommended grab bar on door frame at entry to home; personal alarm PRN;   initial HEP   PATIENT EDUCATION:  Education details: HEP update  Person educated: Spouse Education method: Explanation, Demonstration, Verbal cues, Tactile cues, and Handouts Education comprehension: verbalized understanding, verbal cues required, tactile cues required, and needs further education  HOME EXERCISE PROGRAM: Access Code: [email or web address] URL: [email or web address] Date: 10/06/2024 Prepared by: Amanee Iacovelli  Exercises - Braided Sidestepping  - 1 x daily - 7 x weekly - 3 sets - 10 reps - Heel Raises with Counter Support  - 1 x daily - 7 x weekly - 3 sets - 10 reps - Heel Toe Raises with Counter Support  - 1 x daily - 7 x weekly - 3 sets - 10 reps - Mini Squat with Counter Support  - 1 x daily - 7 x weekly - 3 sets - 10 reps - Standing March with Counter Support  - 1 x daily - 7 x weekly - 3 sets - 10 reps - Seated Long Arc Quad  - 1 x daily - 7 x weekly - 2 sets - 10 reps - Seated March  - 1 x daily - 7 x weekly - 2 sets - 10 reps   ASSESSMENT:  CLINICAL IMPRESSION: Pt able to complete interventions. She requires many cues verbal but does better with manually directing her to the desired movement. Shows good functional strength with STS but needs more work on balance/coordination and fine tuning HEP. She requires multiple demos and sets of instructions d/t cognitive impairments.     Madeline Mcdaniel  is a 83 y.o. female who was referred to physical therapy for evaluation and treatment for gait disorder.  Patient presents with physical impairments of impaired activity tolerance, impaired standing balance, impaired ambulation, and decreased safety awareness impacting safe and independent functional mobility.  Examination  revealed patient is at risk for falls and functional decline as evidenced by the following objective test measures: Gait speed 1.35 ft/sec, (2.62 ft/sec is needed for community access)  5xSTS of 22.3 sec (>15 sec indicates increased risk for falls and decreased BLE power).  ABC scale score of 8% indicates a low level of physical functioning.  Madeline Mcdaniel will benefit from skilled PT to address above deficits to improve mobility and activity tolerance to help reach the maximal level of functional independence and mobility. Patient demonstrates understanding of this POC and is in agreement with this plan.   OBJECTIVE IMPAIRMENTS: difficulty walking.   ACTIVITY LIMITATIONS: carrying, lifting, squatting, stairs, and locomotion level  PARTICIPATION LIMITATIONS: community activity  PERSONAL FACTORS: Age, Fitness, Time since onset of injury/illness/exacerbation, and 1-2 comorbidities: Anemia, asthma, bipolar d/o, dementia, HTN, osteopenia, chronic cystitis w/ recurrent UTI's are also affecting patient's functional outcome.   REHAB POTENTIAL: Good  CLINICAL DECISION MAKING: Evolving/moderate complexity  EVALUATION COMPLEXITY: Moderate   GOALS: Goals reviewed with patient? Yes  SHORT TERM GOALS: Target date: 10/12/2024   Patient will be independent with initial HEP to improve outcomes and carryover.  Baseline: 100% PT assist required for correct completion Goal status: INITIAL  2.  Patient will be educated on strategies to decrease risk of falls.  Baseline: no education yet Goal status: IN PROGRESS  3.  Patient will demonstrate increased gait speed to >/=  1.75 ft/sec to decrease  risk for falls with transitional mobility. Baseline: 1.35 ft/sec Goal status: INITIAL  LONG TERM GOALS: Target date: 10/26/2024   Patient will be independent with advanced/ongoing HEP to facilitate ability to maintain/progress functional gains from skilled physical therapy services. Baseline: no advanced HEP yet Goal status: INITIAL   4.  Patient will demonstrate improved BLE strength to >/= 5/5 for improved stability and ease of mobility . Baseline: Refer to above LE MMT table Goal status: INITIAL  5.  Patient will improve 5xSTS time to </= 16 seconds for improved efficiency and safety with transfers. Baseline: 22.30 sec Goal status: INITIAL   6.  Patient will demonstrate gait speed of >/= 2.00 ft/sec (0.55 m/s) to be a safe limited community ambulator with decreased risk for recurrent falls.  Baseline: 1.35 ft/sec Goal status: INITIAL  10.  Patient will report >/= 27% on ABC scale (MCID = 19%) to demonstrate improved balance confidence with functional mobility and gait. Baseline: 8% Goal status: INITIAL   PLAN:  PT FREQUENCY: 1-2x/week  PT DURATION: 8 weeks  PLANNED INTERVENTIONS: 97164- PT Re-evaluation, 97750- Physical Performance Testing, 97110-Therapeutic exercises, 97530- Therapeutic activity, 97112- Neuromuscular re-education, 97535- Self Care, [id number]- Manual therapy, [phone]- Gait training, Patient/Family education, Balance training, Stair training, and Taping  PLAN FOR NEXT SESSION: Continue PT x 3 more visits for HEP and exercise instructions/tips and balance training   Madeline Mcdaniel, PTA 10/06/2024, 2:01 PM

## 2024-10-07 ENCOUNTER — Other Ambulatory Visit (HOSPITAL_BASED_OUTPATIENT_CLINIC_OR_DEPARTMENT_OTHER): Payer: Self-pay

## 2024-10-07 MED ORDER — COMIRNATY 30 MCG/0.3ML IM SUSY
0.3000 mL | PREFILLED_SYRINGE | Freq: Once | INTRAMUSCULAR | 0 refills | Status: AC
  Filled 2024-10-07: qty 0.3, 1d supply, fill #0

## 2024-10-12 ENCOUNTER — Ambulatory Visit: Admitting: Rehabilitation

## 2024-10-12 ENCOUNTER — Encounter: Payer: Self-pay | Admitting: Rehabilitation

## 2024-10-12 DIAGNOSIS — R262 Difficulty in walking, not elsewhere classified: Secondary | ICD-10-CM | POA: Diagnosis not present

## 2024-10-12 NOTE — Therapy (Signed)
 OUTPATIENT PHYSICAL THERAPY NEURO TREATEMNET   Patient Name: Madeline Mcdaniel MRN: [id number] DOB:09/30/1941, 83 y.o., female Today's Date: 10/12/2024   END OF SESSION:  PT End of Session - 10/12/24 1427     Visit Number 3    Date for Recertification  11/23/24    Authorization Type MCR + supplement    PT Start Time 1401    PT Stop Time 1443    PT Time Calculation (min) 42 min           Past Medical History:  Diagnosis Date   Allergic rhinitis, cause unspecified 04/27/2011   Anemia 04/27/2011   Asthma 04/27/2011   Bipolar affective disorder (HCC) 04/27/2011   Cervical spondylosis 06/01/2012   Chronic bronchitis 04/27/2011   Chronic cystitis 04/27/2011   Colon polyps 04/27/2011   Degenerative joint disease 04/27/2011   Dementia (HCC)    GERD (gastroesophageal reflux disease) 04/27/2011   HTN (hypertension) 04/27/2011   Hyperlipidemia 04/27/2011   Mild cognitive impairment 04/12/2013   Osteopenia 06/01/2012   Recurrent UTI 04/27/2011   Right carpal tunnel syndrome 07/22/2012   TIA (transient ischemic attack) 04/27/2011   Urinary incontinence 04/27/2011   Past Surgical History:  Procedure Laterality Date   BLADDER SUSPENSION     A&P REPAIR WITH MESH   BREAST BIOPSY     BREAST LUMPECTOMY     SEVERAL TIMES   BUNIONECTOMY     LEFT FOOT   CHOLECYSTECTOMY     COLONOSCOPY  71YRS AGO   IN [place] ,PT DOES NOT REMEMBER DOCTOR   [facility]ride Bunionectomy Left 09/09/2014   @ [facility]   NEURECTOMY FOOT Left 09/09/2014   @ [facility]   ORTHOSCOPIC CARPETOMY     BOTH THUMBS   POLYPECTOMY  10 YRS AGO   BENIGN POLYPS X2   STERIOTACTIC BREAST BIOPSY     RIGHT   TENDON TRANSFER  12/09/2012   Procedure: TENDON TRANSFER;  Surgeon: Arley JONELLE Curia, MD;  Location: [facility];  Service: Orthopedics;  Laterality: Left;  [place] PIP with FDS Slip Left ring finger, [place]   TONSILLECTOMY     TOTAL ABDOMINAL HYSTERECTOMY     TRIGGER FINGER RELEASE     LEFT RING  FINGER   Patient Active Problem List   Diagnosis Date Noted   Gait disorder 08/22/2024   B12 deficiency 02/19/2024   Right flank pain 08/21/2023   Community acquired pneumonia of left lower lobe of lung 09/10/2022   Wheezing 04/21/2022   Hyperglycemia 04/21/2022   Closed left hip fracture, initial encounter (HCC) 04/15/2021   Nondisplaced intertrochanteric fracture of left femur, init (HCC) 04/15/2021   Constipation, unspecified 04/15/2021   Recurrent falls 01/17/2021   Vitamin D  deficiency 01/17/2021   Bipolar disorder, unspecified (HCC) 12/03/2020   Depression, unspecified 12/03/2020   Essential hypertension 12/03/2020   Gastroesophageal reflux disease without esophagitis 12/03/2020   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   Dementia without behavioral disturbance (HCC) 01/12/2019   Lumbar radiculopathy 01/06/2019   Left hip pain 12/22/2018   Memory dysfunction 01/30/2017   Urinary frequency 01/19/2016   Loss of weight 04/19/2015   Dyspnea 04/06/2015   Edema 12/28/2014   Onychomycosis 08/31/2014   Pain in lower limb 08/31/2014   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   Carpal tunnel syndrome, right 08/25/2014   Bilateral bunions 08/25/2014   Acute on chronic cholecystitis 06/16/2014   Left foot pain 08/25/2013   Fatigue 04/08/2013   Right carpal tunnel syndrome 07/22/2012  Brachial neuritis or radiculitis 07/01/2012   Trigger finger, left 07/01/2012   Osteopenia 06/01/2012   Cervical spondylosis 06/01/2012   Mild intermittent asthma 04/27/2011   Degenerative joint disease 04/27/2011   Bipolar affective disorder (HCC) 04/27/2011   Chronic bronchitis (HCC) 04/27/2011   GERD (gastroesophageal reflux disease) 04/27/2011   Allergic rhinitis 04/27/2011   HTN (hypertension) 04/27/2011   Chronic cystitis 04/27/2011   Colon polyps 04/27/2011   Anemia 04/27/2011   H/O: hysterectomy 04/27/2011   TIA (transient ischemic attack) 04/27/2011    Urinary incontinence 04/27/2011   Recurrent UTI 04/27/2011   Hyperlipidemia 04/27/2011   Preventative health care 04/27/2011    PCP: Norleen Lynwood ORN, MD   REFERRING PROVIDER: Norleen Lynwood ORN, MD   REFERRING DIAG: R26.9 (ICD-10-CM) - Gait disorder R53.1 (ICD-10-CM) - General weakness  THERAPY DIAG:  Difficulty in walking, not elsewhere classified  RATIONALE FOR EVALUATION AND TREATMENT: Rehabilitation  ONSET DATE: at onset of UTI's per spouse  NEXT MD VISIT:    SUBJECTIVE:                                                                                                                                                                                                         SUBJECTIVE STATEMENT: Patient spouse reports they are doing the home exercises and patient is doing a little better.   They deny any falls.    EVAL: 83 y/o patient referred from Dr Norleen for gait disorder.   Patient has recurrent UTI's and spouse states she gets wild and unsteady when she gets a UTI.   In fact she is on PO ABX right now for a new UTI dx'd 2 days ago at urgent care.   Spouse reports that once the UTI clears she is ok with ambulation; or at least as ok as possible for her .   Patient has dementia with constant disorientation and confusion.    She is pleasantly confused throughout today's visit and all history is obtained from spouse.   He provides 24/7 care for her and manages all of her meds, meals, ADLs and transportation.   He reports she is usually very pleasant and cooperative and that she has no recent falls.  He states she does not wander unless she gets a UTI.   He reports the home is handicapped accessible with shower chair, multple grab bars, removable shower head, etc.   Spouse is not exactly sure what necessitated the PT referral unless patient was unsteady and had a UTI at her last office visit.    Spouse does  not feel she has declined in function per se.   He provides hand held assistance for safety  and direction whenever they are in the community.   HE does not allow her walk unassisted out of the home at any time.  Spouse reports they have an upstairs at home, but the patient does not have to go upstairs for any reason.   He has an alarm on their stairs so that the patient cannot get on them.   He states the patient fell several years ago and had a L hip fx, but nothing since that time.  He feels just a few PT visits will be enough.  Pt accompanied by: spouse  PAIN: Are you having pain? Yes: NPRS scale: denies any pain Pain location: no pain Pain description: no pain Aggravating factors: no pain Relieving factors: same  PERTINENT HISTORY:  Anemia, asthma, bipolar d/o, dementia, HTN, osteopenia, chronic cystitis w/ recurrent UTI's  PRECAUTIONS: Fall  RED FLAGS: None  WEIGHT BEARING RESTRICTIONS: No  FALLS:  Has patient fallen in last 6 months? No  LIVING ENVIRONMENT: Lives with: lives with their spouse Lives in: House/apartment Stairs: Yes: External: 1 steps; none Has following equipment at home: None  OCCUPATION: retired [employer]  PLOF: supervision for all mobility  PATIENT GOALS: patient cannot voice a goal   OBJECTIVE: (objective measures completed at initial evaluation unless otherwise dated)  DIAGNOSTIC FINDINGS:  N/A  COGNITION: Overall cognitive status: dementia and at baseline   SENSATION: WFL  COORDINATION: No issues noted  EDEMA:  None noted  MUSCLE TONE: WNL  DTRs:  NT  POSTURE:  Mild thoracic kyphosis and forward head posture    LOWER EXTREMITY ROM:     Active  Right eval Left eval  Hip flexion    Hip extension    Hip abduction    Hip adduction    Hip internal rotation    Hip external rotation    Knee flexion    Knee extension    Ankle dorsiflexion    Ankle plantarflexion    Ankle inversion    Ankle eversion     (Blank rows = not tested)  LOWER EXTREMITY MMT:  Patient has difficulty following instructions for formal MMT, but  is able to partly complete  MMT Right eval Left eval  Hip flexion 4+ 4+  Hip extension    Hip abduction    Hip adduction    Hip internal rotation    Hip external rotation    Knee flexion    Knee extension 5 5  Ankle dorsiflexion    Ankle plantarflexion    Ankle inversion    Ankle eversion    (Blank rows = not tested)  BED MOBILITY:  Sit to supine SBA Supine to sit SBA  TRANSFERS: Assistive device utilized: None  Sit to stand: SBA Stand to sit: SBA Chair to chair: SBA Floor: NT  GAIT: Distance walked: into clinic 200' and in clinic x 100' Assistive device utilized: holds spouse's hand Level of assistance: SBA Gait pattern: decreased step length- Right and decreased step length- Left Comments: short steps with minimal heel strike BLE   FUNCTIONAL TESTS:  patient cannot complete formal balance tests due to unable to completely follow all commands or multi step instructions Gait speed = 1.35 ft/sec  5X STS = 22 sec    PATIENT SURVEYS:  ABC scale: The Activities-Specific Balance Confidence (ABC) Scale 0% 10 20 30  40 50 60 70 80 90 100% No confidence<->completely confident  "  How confident are you that you will not lose your balance or become unsteady when you . . .   Date tested 10/12/2024  Walk around the house 50%  2. Walk up or down stairs 10%  3. Bend over and pick up a slipper from in front of a closet floor 5%  4. Reach for a small can off a shelf at eye level 10%  5. Stand on tip toes and reach for something above your head 0%  6. Stand on a chair and reach for something 0%  7. Sweep the floor 0%  8. Walk outside the house to a car parked in the driveway 20%  9. Get into or out of a car 5%  10. Walk across a parking lot to the mall 5%  11. Walk up or down a ramp 15%  12. Walk in a crowded mall where people rapidly walk past you 5%  13. Are bumped into by people as you walk through the mall 5%  14. Step onto or off of an escalator while you are holding  onto the railing 0%  15. Step onto or off an escalator while holding onto parcels such that you cannot hold onto the railing 0%  16. Walk outside on icy sidewalks 0%  Total: #/16 130/1600      TODAY'S TREATMENT:  10/12/24 THERAPEUTIC EXERCISE: To improve strength and endurance.  Demonstration, verbal and tactile cues throughout for technique. NuStep L0 x 6' w/ intermittent PT facilitation to keep moving arms and legs, keeping both hands on machine  NEUROMUSCULAR RE-EDUCATION: To improve balance, coordination, kinesthesia, and proprioception. Standing ball toss w/ small ball x 2';  beach ball x 2' Seated ball toss small ball x 2' w/ lateral tosses for trunk rotation Standing beach ball kick x 2' Hand held toe raises x 2/10 BLE w/ PT mirroring Hand held minisquats x 2/10 BLE w/ PT mirroring Hand held marching x 2/10 BLE w/ PT mirroring On square foam:  Hand held toe raises x 2/10 w/ PT mirroring  Hand held marching x 2/10 w/ PT mirroring  Small ball toss x 2' Sidestepping w/ hand held assist x 20' x 2 Braiding in front w/ hand held assist and mirroring x 20' x 2 Sidestepping on long foam x 4 laps back and forth with min assist/hand held Rechecked gait speed  10/06/24 Standing heel raises 2x10 Standing hip abduction x 10 B Standing marching x 10 B Standing mini squat x 10 Forward steps over 1lb weight x 10; lateral steps x 10 Seated LAQ BLE x 12  Sit to stand 2 x 10 Sidesteps 1lb each leg along counter  09/28/24 SELF CARE: Provided education on PT POC progression and to promote safe home environment--recommended grab bar on door frame at entry to home; personal alarm PRN;   initial HEP   PATIENT EDUCATION:  Education details: HEP update  Person educated: Spouse Education method: Explanation, Demonstration, Verbal cues, Tactile cues, and Handouts Education comprehension: verbalized understanding, verbal cues required, tactile cues required, and needs further education  HOME  EXERCISE PROGRAM: Access Code: [email or web address] URL: [email or web address] Date: 10/06/2024 Prepared by: Braylin Clark  Exercises - Braided Sidestepping  - 1 x daily - 7 x weekly - 3 sets - 10 reps - Heel Raises with Counter Support  - 1 x daily - 7 x weekly - 3 sets - 10 reps - Heel Toe Raises with Counter Support  - 1 x daily - 7 x weekly - 3 sets -  10 reps - Mini Squat with Counter Support  - 1 x daily - 7 x weekly - 3 sets - 10 reps - Standing March with Counter Support  - 1 x daily - 7 x weekly - 3 sets - 10 reps - Seated Long Arc Quad  - 1 x daily - 7 x weekly - 2 sets - 10 reps - Seated March  - 1 x daily - 7 x weekly - 2 sets - 10 reps   ASSESSMENT:  CLINICAL IMPRESSION: Patient spouse reports they are doing the home exercises and patient is doing a little better.   They deny any falls.  Spouse is using a seated ellipitical at home for the patient which we recommend he continue doing.   Continue to advised him to do hand held exercises daily as tolerated.   PT remains necessary for decreased balance, gait speed, and HEP abilities.   Continue per POC  Madeline Mcdaniel is a 83 y.o. female who was referred to physical therapy for evaluation and treatment for gait disorder.  Patient presents with physical impairments of impaired activity tolerance, impaired standing balance, impaired ambulation, and decreased safety awareness impacting safe and independent functional mobility.  Examination revealed patient is at risk for falls and functional decline as evidenced by the following objective test measures: Gait speed 1.35 ft/sec, (2.62 ft/sec is needed for community access)  5xSTS of 22.3 sec (>15 sec indicates increased risk for falls and decreased BLE power).  ABC scale score of 8% indicates a low level of physical functioning.  Madeline Mcdaniel will benefit from skilled PT to address above deficits to improve mobility and activity tolerance to help reach the maximal level of functional independence and  mobility. Patient demonstrates understanding of this POC and is in agreement with this plan.   OBJECTIVE IMPAIRMENTS: difficulty walking.   ACTIVITY LIMITATIONS: carrying, lifting, squatting, stairs, and locomotion level  PARTICIPATION LIMITATIONS: community activity  PERSONAL FACTORS: Age, Fitness, Time since onset of injury/illness/exacerbation, and 1-2 comorbidities: Anemia, asthma, bipolar d/o, dementia, HTN, osteopenia, chronic cystitis w/ recurrent UTI's are also affecting patient's functional outcome.   REHAB POTENTIAL: Good  CLINICAL DECISION MAKING: Evolving/moderate complexity  EVALUATION COMPLEXITY: Moderate   GOALS: Goals reviewed with patient? Yes  SHORT TERM GOALS: Target date: 10/12/2024   Patient will be independent with initial HEP to improve outcomes and carryover.  Baseline: 100% PT assist required for correct completion 10/12/24:  spouse can teach back HEP Goal status: MET  2.  Patient will be educated on strategies to decrease risk of falls.  Baseline: no education yet Goal status: IN PROGRESS  3.  Patient will demonstrate increased gait speed to >/=  1.75 ft/sec to decrease risk for falls with transitional mobility. Baseline: 1.35 ft/sec 10/12/24:  1.53 ft/sec Goal status: MET  LONG TERM GOALS: Target date: 10/26/2024   Patient will be independent with advanced/ongoing HEP to facilitate ability to maintain/progress functional gains from skilled physical therapy services. Baseline: no advanced HEP yet Goal status: INITIAL   4.  Patient will demonstrate improved BLE strength to >/= 5/5 for improved stability and ease of mobility . Baseline: Refer to above LE MMT table Goal status: INITIAL  5.  Patient will improve 5xSTS time to </= 16 seconds for improved efficiency and safety with transfers. Baseline: 22.30 sec Goal status: INITIAL   6.  Patient will demonstrate gait speed of >/= 2.00 ft/sec (0.55 m/s) to be a safe limited community ambulator  with decreased risk for recurrent  falls.  Baseline: 1.35 ft/sec 10/12/24:  1.53 ft/sec Goal status: IN PROGRESS  10.  Patient will report >/= 27% on ABC scale (MCID = 19%) to demonstrate improved balance confidence with functional mobility and gait. Baseline: 8% Goal status: INITIAL   PLAN:  PT FREQUENCY: 1-2x/week  PT DURATION: 8 weeks  PLANNED INTERVENTIONS: [id number]- PT Re-evaluation, 97750- Physical Performance Testing, 97110-Therapeutic exercises, 97530- Therapeutic activity, [id number]- Neuromuscular re-education, 97535- Self Care, [id number]- Manual therapy, [phone]- Gait training, Patient/Family education, Balance training, Stair training, and Taping  PLAN FOR NEXT SESSION: Continue PT x 2 more visit  Madeline Mcdaniel, PT 10/12/2024, 3:00 PM

## 2024-10-19 ENCOUNTER — Ambulatory Visit

## 2024-10-19 DIAGNOSIS — R262 Difficulty in walking, not elsewhere classified: Secondary | ICD-10-CM

## 2024-10-19 NOTE — Therapy (Signed)
 OUTPATIENT PHYSICAL THERAPY NEURO TREATEMNET   Patient Name: Madeline Mcdaniel MRN: [id number] DOB:[birth year], 83 y.o., female Today's Date: 10/19/2024   END OF SESSION:  PT End of Session - 10/19/24 1405     Visit Number 4    Date for Recertification  11/23/24    Authorization Type MCR + supplement    PT Start Time 1315    PT Stop Time 1400    PT Time Calculation (min) 45 min            Past Medical History:  Diagnosis Date   Allergic rhinitis, cause unspecified 04/27/2011   Anemia 04/27/2011   Asthma 04/27/2011   Bipolar affective disorder (HCC) 04/27/2011   Cervical spondylosis 06/01/2012   Chronic bronchitis 04/27/2011   Chronic cystitis 04/27/2011   Colon polyps 04/27/2011   Degenerative joint disease 04/27/2011   Dementia (HCC)    GERD (gastroesophageal reflux disease) 04/27/2011   HTN (hypertension) 04/27/2011   Hyperlipidemia 04/27/2011   Mild cognitive impairment 04/12/2013   Osteopenia 06/01/2012   Recurrent UTI 04/27/2011   Right carpal tunnel syndrome 07/22/2012   TIA (transient ischemic attack) 04/27/2011   Urinary incontinence 04/27/2011   Past Surgical History:  Procedure Laterality Date   BLADDER SUSPENSION     A&P REPAIR WITH MESH   BREAST BIOPSY     BREAST LUMPECTOMY     SEVERAL TIMES   BUNIONECTOMY     LEFT FOOT   CHOLECYSTECTOMY     COLONOSCOPY  16YRS AGO   IN [place] ,PT DOES NOT REMEMBER DOCTOR   [facility]Bride Bunionectomy Left 09/09/2014   @ [facility]   NEURECTOMY FOOT Left 09/09/2014   @ [facility]   ORTHOSCOPIC CARPETOMY     BOTH THUMBS   POLYPECTOMY  10 YRS AGO   BENIGN POLYPS X2   STERIOTACTIC BREAST BIOPSY     RIGHT   TENDON TRANSFER  12/09/2012   Procedure: TENDON TRANSFER;  Surgeon: Arley JONELLE Curia, MD;  Location: [facility];  Service: Orthopedics;  Laterality: Left;  [place] PIP with FDS Slip Left ring finger, [place]   TONSILLECTOMY     TOTAL ABDOMINAL HYSTERECTOMY     TRIGGER FINGER RELEASE     LEFT  RING FINGER   Patient Active Problem List   Diagnosis Date Noted   Gait disorder 08/22/2024   B12 deficiency 02/19/2024   Right flank pain 08/21/2023   Community acquired pneumonia of left lower lobe of lung 09/10/2022   Wheezing 04/21/2022   Hyperglycemia 04/21/2022   Closed left hip fracture, initial encounter (HCC) 04/15/2021   Nondisplaced intertrochanteric fracture of left femur, init (HCC) 04/15/2021   Constipation, unspecified 04/15/2021   Recurrent falls 01/17/2021   Vitamin D  deficiency 01/17/2021   Bipolar disorder, unspecified (HCC) 12/03/2020   Depression, unspecified 12/03/2020   Essential hypertension 12/03/2020   Gastroesophageal reflux disease without esophagitis 12/03/2020   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   Dementia without behavioral disturbance (HCC) 01/12/2019   Lumbar radiculopathy 01/06/2019   Left hip pain 12/22/2018   Memory dysfunction 01/30/2017   Urinary frequency 01/19/2016   Loss of weight 04/19/2015   Dyspnea 04/06/2015   Edema 12/28/2014   Onychomycosis 08/31/2014   Pain in lower limb 08/31/2014   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   Carpal tunnel syndrome, right 08/25/2014   Bilateral bunions 08/25/2014   Acute on chronic cholecystitis 06/16/2014   Left foot pain 08/25/2013   Fatigue 04/08/2013   Right carpal tunnel syndrome 07/22/2012  Brachial neuritis or radiculitis 07/01/2012   Trigger finger, left 07/01/2012   Osteopenia 06/01/2012   Cervical spondylosis 06/01/2012   Mild intermittent asthma 04/27/2011   Degenerative joint disease 04/27/2011   Bipolar affective disorder (HCC) 04/27/2011   Chronic bronchitis (HCC) 04/27/2011   GERD (gastroesophageal reflux disease) 04/27/2011   Allergic rhinitis 04/27/2011   HTN (hypertension) 04/27/2011   Chronic cystitis 04/27/2011   Colon polyps 04/27/2011   Anemia 04/27/2011   H/O: hysterectomy 04/27/2011   TIA (transient ischemic attack) 04/27/2011    Urinary incontinence 04/27/2011   Recurrent UTI 04/27/2011   Hyperlipidemia 04/27/2011   Preventative health care 04/27/2011    PCP: Norleen Lynwood ORN, MD   REFERRING PROVIDER: Norleen Lynwood ORN, MD   REFERRING DIAG: R26.9 (ICD-10-CM) - Gait disorder R53.1 (ICD-10-CM) - General weakness  THERAPY DIAG:  Difficulty in walking, not elsewhere classified  RATIONALE FOR EVALUATION AND TREATMENT: Rehabilitation  ONSET DATE: at onset of UTI's per spouse  NEXT MD VISIT:    SUBJECTIVE:                                                                                                                                                                                                         SUBJECTIVE STATEMENT: NO pain today, no recent falls, doing ok with home exercises per spouse    EVAL: 50 y/o patient referred from Dr Norleen for gait disorder.   Patient has recurrent UTI's and spouse states she gets wild and unsteady when she gets a UTI.   In fact she is on PO ABX right now for a new UTI dx'd 2 days ago at urgent care.   Spouse reports that once the UTI clears she is ok with ambulation; or at least as ok as possible for her .   Patient has dementia with constant disorientation and confusion.    She is pleasantly confused throughout today's visit and all history is obtained from spouse.   He provides 24/7 care for her and manages all of her meds, meals, ADLs and transportation.   He reports she is usually very pleasant and cooperative and that she has no recent falls.  He states she does not wander unless she gets a UTI.   He reports the home is handicapped accessible with shower chair, multple grab bars, removable shower head, etc.   Spouse is not exactly sure what necessitated the PT referral unless patient was unsteady and had a UTI at her last office visit.    Spouse does not feel she has declined in function per se.  He provides hand held assistance for safety and direction whenever they are in the  community.   HE does not allow her walk unassisted out of the home at any time.  Spouse reports they have an upstairs at home, but the patient does not have to go upstairs for any reason.   He has an alarm on their stairs so that the patient cannot get on them.   He states the patient fell several years ago and had a L hip fx, but nothing since that time.  He feels just a few PT visits will be enough.  Pt accompanied by: spouse  PAIN: Are you having pain? Yes: NPRS scale: denies any pain Pain location: no pain Pain description: no pain Aggravating factors: no pain Relieving factors: same  PERTINENT HISTORY:  Anemia, asthma, bipolar d/o, dementia, HTN, osteopenia, chronic cystitis w/ recurrent UTI's  PRECAUTIONS: Fall  RED FLAGS: None  WEIGHT BEARING RESTRICTIONS: No  FALLS:  Has patient fallen in last 6 months? No  LIVING ENVIRONMENT: Lives with: lives with their spouse Lives in: House/apartment Stairs: Yes: External: 1 steps; none Has following equipment at home: None  OCCUPATION: retired [employer]  PLOF: supervision for all mobility  PATIENT GOALS: patient cannot voice a goal   OBJECTIVE: (objective measures completed at initial evaluation unless otherwise dated)  DIAGNOSTIC FINDINGS:  N/A  COGNITION: Overall cognitive status: dementia and at baseline   SENSATION: WFL  COORDINATION: No issues noted  EDEMA:  None noted  MUSCLE TONE: WNL  DTRs:  NT  POSTURE:  Mild thoracic kyphosis and forward head posture    LOWER EXTREMITY ROM:     Active  Right eval Left eval  Hip flexion    Hip extension    Hip abduction    Hip adduction    Hip internal rotation    Hip external rotation    Knee flexion    Knee extension    Ankle dorsiflexion    Ankle plantarflexion    Ankle inversion    Ankle eversion     (Blank rows = not tested)  LOWER EXTREMITY MMT:  Patient has difficulty following instructions for formal MMT, but is able to partly complete  MMT  Right eval Left eval  Hip flexion 4+ 4+  Hip extension    Hip abduction    Hip adduction    Hip internal rotation    Hip external rotation    Knee flexion    Knee extension 5 5  Ankle dorsiflexion    Ankle plantarflexion    Ankle inversion    Ankle eversion    (Blank rows = not tested)  BED MOBILITY:  Sit to supine SBA Supine to sit SBA  TRANSFERS: Assistive device utilized: None  Sit to stand: SBA Stand to sit: SBA Chair to chair: SBA Floor: NT  GAIT: Distance walked: into clinic 200' and in clinic x 100' Assistive device utilized: holds spouse's hand Level of assistance: SBA Gait pattern: decreased step length- Right and decreased step length- Left Comments: short steps with minimal heel strike BLE   FUNCTIONAL TESTS:  patient cannot complete formal balance tests due to unable to completely follow all commands or multi step instructions Gait speed = 1.35 ft/sec  5X STS = 22 sec    PATIENT SURVEYS:  ABC scale: The Activities-Specific Balance Confidence (ABC) Scale 0% 10 20 30  40 50 60 70 80 90 100% No confidence<->completely confident  "How confident are you that you will not lose your balance  or become unsteady when you . . .   Date tested 10/19/2024  Walk around the house 50%  2. Walk up or down stairs 10%  3. Bend over and pick up a slipper from in front of a closet floor 5%  4. Reach for a small can off a shelf at eye level 10%  5. Stand on tip toes and reach for something above your head 0%  6. Stand on a chair and reach for something 0%  7. Sweep the floor 0%  8. Walk outside the house to a car parked in the driveway 20%  9. Get into or out of a car 5%  10. Walk across a parking lot to the mall 5%  11. Walk up or down a ramp 15%  12. Walk in a crowded mall where people rapidly walk past you 5%  13. Are bumped into by people as you walk through the mall 5%  14. Step onto or off of an escalator while you are holding onto the railing 0%  15. Step onto  or off an escalator while holding onto parcels such that you cannot hold onto the railing 0%  16. Walk outside on icy sidewalks 0%  Total: #/16 130/1600      TODAY'S TREATMENT:  10/19/24  NuStep L1 x 6' w/ intermittent PT facilitation to keep moving arms and legs, keeping both hands on machine Sit to stand with therapist hand support x 10 Standing hip abduction- too much compensation Standing marching x 10 BLE Toe taps BLE x 10 9' stool Sidestepping with HHA 5x along mat table Color tapping different colors x 10 Seated LAQ BLE 2lb 2 x 10 Standing ball toss and catch fwd plane and lateral tosses   10/12/24 THERAPEUTIC EXERCISE: To improve strength and endurance.  Demonstration, verbal and tactile cues throughout for technique. NuStep L0 x 6' w/ intermittent PT facilitation to keep moving arms and legs, keeping both hands on machine  NEUROMUSCULAR RE-EDUCATION: To improve balance, coordination, kinesthesia, and proprioception. Standing ball toss w/ small ball x 2';  beach ball x 2' Seated ball toss small ball x 2' w/ lateral tosses for trunk rotation Standing beach ball kick x 2' Hand held toe raises x 2/10 BLE w/ PT mirroring Hand held minisquats x 2/10 BLE w/ PT mirroring Hand held marching x 2/10 BLE w/ PT mirroring On square foam:  Hand held toe raises x 2/10 w/ PT mirroring  Hand held marching x 2/10 w/ PT mirroring  Small ball toss x 2' Sidestepping w/ hand held assist x 20' x 2 Braiding in front w/ hand held assist and mirroring x 20' x 2 Sidestepping on long foam x 4 laps back and forth with min assist/hand held Rechecked gait speed  10/06/24 Standing heel raises 2x10 Standing hip abduction x 10 B Standing marching x 10 B Standing mini squat x 10 Forward steps over 1lb weight x 10; lateral steps x 10 Seated LAQ BLE x 12  Sit to stand 2 x 10 Sidesteps 1lb each leg along counter  09/28/24 SELF CARE: Provided education on PT POC progression and to promote safe home  environment--recommended grab bar on door frame at entry to home; personal alarm PRN;   initial HEP   PATIENT EDUCATION:  Education details: HEP update  Person educated: Spouse Education method: Explanation, Demonstration, Verbal cues, Tactile cues, and Handouts Education comprehension: verbalized understanding, verbal cues required, tactile cues required, and needs further education  HOME EXERCISE PROGRAM: Access Code:  [email or web address] URL: [email or web address] Date: 10/06/2024 Prepared by: Perian Tedder  Exercises - Braided Sidestepping  - 1 x daily - 7 x weekly - 3 sets - 10 reps - Heel Raises with Counter Support  - 1 x daily - 7 x weekly - 3 sets - 10 reps - Heel Toe Raises with Counter Support  - 1 x daily - 7 x weekly - 3 sets - 10 reps - Mini Squat with Counter Support  - 1 x daily - 7 x weekly - 3 sets - 10 reps - Standing March with Counter Support  - 1 x daily - 7 x weekly - 3 sets - 10 reps - Seated Long Arc Quad  - 1 x daily - 7 x weekly - 2 sets - 10 reps - Seated March  - 1 x daily - 7 x weekly - 2 sets - 10 reps   ASSESSMENT:  CLINICAL IMPRESSION: Pt was able to complete interventions today but requires many tactile cue throughout session and mirroring to facilitate proper movements. Advanced balance activities, strengthening, and added coordination activities as well. Was unable to complete 5xSTS today due to cognition. PT remains necessary for decreased balance, gait speed, and HEP abilities.   Continue per POC  Madeline Mcdaniel is a 83 y.o. female who was referred to physical therapy for evaluation and treatment for gait disorder.  Patient presents with physical impairments of impaired activity tolerance, impaired standing balance, impaired ambulation, and decreased safety awareness impacting safe and independent functional mobility.  Examination revealed patient is at risk for falls and functional decline as evidenced by the following objective test measures: Gait  speed 1.35 ft/sec, (2.62 ft/sec is needed for community access)  5xSTS of 22.3 sec (>15 sec indicates increased risk for falls and decreased BLE power).  ABC scale score of 8% indicates a low level of physical functioning.  Madeline Mcdaniel will benefit from skilled PT to address above deficits to improve mobility and activity tolerance to help reach the maximal level of functional independence and mobility. Patient demonstrates understanding of this POC and is in agreement with this plan.   OBJECTIVE IMPAIRMENTS: difficulty walking.   ACTIVITY LIMITATIONS: carrying, lifting, squatting, stairs, and locomotion level  PARTICIPATION LIMITATIONS: community activity  PERSONAL FACTORS: Age, Fitness, Time since onset of injury/illness/exacerbation, and 1-2 comorbidities: Anemia, asthma, bipolar d/o, dementia, HTN, osteopenia, chronic cystitis w/ recurrent UTI's are also affecting patient's functional outcome.   REHAB POTENTIAL: Good  CLINICAL DECISION MAKING: Evolving/moderate complexity  EVALUATION COMPLEXITY: Moderate   GOALS: Goals reviewed with patient? Yes  SHORT TERM GOALS: Target date: 10/12/2024   Patient will be independent with initial HEP to improve outcomes and carryover.  Baseline: 100% PT assist required for correct completion 10/12/24:  spouse can teach back HEP Goal status: MET  2.  Patient will be educated on strategies to decrease risk of falls.  Baseline: no education yet Goal status: IN PROGRESS  3.  Patient will demonstrate increased gait speed to >/=  1.75 ft/sec to decrease risk for falls with transitional mobility. Baseline: 1.35 ft/sec 10/12/24:  1.53 ft/sec Goal status: MET  LONG TERM GOALS: Target date: 10/26/2024   Patient will be independent with advanced/ongoing HEP to facilitate ability to maintain/progress functional gains from skilled physical therapy services. Baseline: no advanced HEP yet Goal status: INITIAL   4.  Patient will demonstrate improved BLE  strength to >/= 5/5 for improved stability and ease of mobility . Baseline: Refer to above LE MMT table Goal status:  INITIAL  5.  Patient will improve 5xSTS time to </= 16 seconds for improved efficiency and safety with transfers. Baseline: 22.30 sec Goal status: INITIAL   6.  Patient will demonstrate gait speed of >/= 2.00 ft/sec (0.55 m/s) to be a safe limited community ambulator with decreased risk for recurrent falls.  Baseline: 1.35 ft/sec 10/12/24:  1.53 ft/sec Goal status: IN PROGRESS  10.  Patient will report >/= 27% on ABC scale (MCID = 19%) to demonstrate improved balance confidence with functional mobility and gait. Baseline: 8% Goal status: INITIAL   PLAN:  PT FREQUENCY: 1-2x/week  PT DURATION: 8 weeks  PLANNED INTERVENTIONS: [id number]- PT Re-evaluation, 97750- Physical Performance Testing, 97110-Therapeutic exercises, 97530- Therapeutic activity, [id number]- Neuromuscular re-education, 97535- Self Care, [id number]- Manual therapy, [phone]- Gait training, Patient/Family education, Balance training, Stair training, and Taping  PLAN FOR NEXT SESSION: Continue PT x 1 more visit?  Murry Diaz L Quintin Hjort, PTA 10/19/2024, 2:05 PM

## 2024-10-20 ENCOUNTER — Ambulatory Visit
Admission: RE | Admit: 2024-10-20 | Discharge: 2024-10-20 | Disposition: A | Source: Ambulatory Visit | Attending: Internal Medicine | Admitting: Internal Medicine

## 2024-10-20 DIAGNOSIS — Z1231 Encounter for screening mammogram for malignant neoplasm of breast: Secondary | ICD-10-CM

## 2024-10-26 ENCOUNTER — Ambulatory Visit: Admitting: Rehabilitation

## 2024-10-26 ENCOUNTER — Encounter: Payer: Self-pay | Admitting: Rehabilitation

## 2024-10-26 DIAGNOSIS — R262 Difficulty in walking, not elsewhere classified: Secondary | ICD-10-CM | POA: Diagnosis not present

## 2024-10-26 NOTE — Therapy (Signed)
 OUTPATIENT PHYSICAL THERAPY NEURO TREATMENT / DC SUMMARY   Patient Name: Madeline Mcdaniel MRN: [id number] DOB:[birth year], 83 y.o., female Today's Date: 10/26/2024   END OF SESSION:  PT End of Session - 10/26/24 1413     Visit Number 5    Date for Recertification  11/23/24    Authorization Type MCR + supplement    PT Start Time 1400    PT Stop Time 1444    PT Time Calculation (min) 44 min            Past Medical History:  Diagnosis Date   Allergic rhinitis, cause unspecified 04/27/2011   Anemia 04/27/2011   Asthma 04/27/2011   Bipolar affective disorder (HCC) 04/27/2011   Cervical spondylosis 06/01/2012   Chronic bronchitis 04/27/2011   Chronic cystitis 04/27/2011   Colon polyps 04/27/2011   Degenerative joint disease 04/27/2011   Dementia (HCC)    GERD (gastroesophageal reflux disease) 04/27/2011   HTN (hypertension) 04/27/2011   Hyperlipidemia 04/27/2011   Mild cognitive impairment 04/12/2013   Osteopenia 06/01/2012   Recurrent UTI 04/27/2011   Right carpal tunnel syndrome 07/22/2012   TIA (transient ischemic attack) 04/27/2011   Urinary incontinence 04/27/2011   Past Surgical History:  Procedure Laterality Date   BLADDER SUSPENSION     A&P REPAIR WITH MESH   BREAST BIOPSY     BREAST LUMPECTOMY     SEVERAL TIMES   BUNIONECTOMY     LEFT FOOT   CHOLECYSTECTOMY     COLONOSCOPY  71YRS AGO   IN [place] ,PT DOES NOT REMEMBER DOCTOR   [facility]ride Bunionectomy Left 09/09/2014   @ [facility]   NEURECTOMY FOOT Left 09/09/2014   @ [facility]   ORTHOSCOPIC CARPETOMY     BOTH THUMBS   POLYPECTOMY  10 YRS AGO   BENIGN POLYPS X2   STERIOTACTIC BREAST BIOPSY     RIGHT   TENDON TRANSFER  12/09/2012   Procedure: TENDON TRANSFER;  Surgeon: Arley JONELLE Curia, MD;  Location: [facility];  Service: Orthopedics;  Laterality: Left;  [place] PIP with FDS Slip Left ring finger, [place]   TONSILLECTOMY     TOTAL ABDOMINAL HYSTERECTOMY     TRIGGER FINGER RELEASE      LEFT RING FINGER   Patient Active Problem List   Diagnosis Date Noted   Gait disorder 08/22/2024   B12 deficiency 02/19/2024   Right flank pain 08/21/2023   Community acquired pneumonia of left lower lobe of lung 09/10/2022   Wheezing 04/21/2022   Hyperglycemia 04/21/2022   Closed left hip fracture, initial encounter (HCC) 04/15/2021   Nondisplaced intertrochanteric fracture of left femur, init (HCC) 04/15/2021   Constipation, unspecified 04/15/2021   Recurrent falls 01/17/2021   Vitamin D  deficiency 01/17/2021   Bipolar disorder, unspecified (HCC) 12/03/2020   Depression, unspecified 12/03/2020   Essential hypertension 12/03/2020   Gastroesophageal reflux disease without esophagitis 12/03/2020   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   Dementia without behavioral disturbance (HCC) 01/12/2019   Lumbar radiculopathy 01/06/2019   Left hip pain 12/22/2018   Memory dysfunction 01/30/2017   Urinary frequency 01/19/2016   Loss of weight 04/19/2015   Dyspnea 04/06/2015   Edema 12/28/2014   Onychomycosis 08/31/2014   Pain in lower limb 08/31/2014   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   Carpal tunnel syndrome, right 08/25/2014   Bilateral bunions 08/25/2014   Acute on chronic cholecystitis 06/16/2014   Left foot pain 08/25/2013   Fatigue 04/08/2013   Right carpal tunnel  syndrome 07/22/2012   Brachial neuritis or radiculitis 07/01/2012   Trigger finger, left 07/01/2012   Osteopenia 06/01/2012   Cervical spondylosis 06/01/2012   Mild intermittent asthma 04/27/2011   Degenerative joint disease 04/27/2011   Bipolar affective disorder (HCC) 04/27/2011   Chronic bronchitis (HCC) 04/27/2011   GERD (gastroesophageal reflux disease) 04/27/2011   Allergic rhinitis 04/27/2011   HTN (hypertension) 04/27/2011   Chronic cystitis 04/27/2011   Colon polyps 04/27/2011   Anemia 04/27/2011   H/O: hysterectomy 04/27/2011   TIA (transient ischemic attack)  04/27/2011   Urinary incontinence 04/27/2011   Recurrent UTI 04/27/2011   Hyperlipidemia 04/27/2011   Preventative health care 04/27/2011    PCP: Norleen Lynwood ORN, MD   REFERRING PROVIDER: Norleen Lynwood ORN, MD   REFERRING DIAG: R26.9 (ICD-10-CM) - Gait disorder R53.1 (ICD-10-CM) - General weakness  THERAPY DIAG:  Difficulty in walking, not elsewhere classified  RATIONALE FOR EVALUATION AND TREATMENT: Rehabilitation  ONSET DATE: at onset of UTI's per spouse  NEXT MD VISIT:    SUBJECTIVE:                                                                                                                                                                                                         SUBJECTIVE STATEMENT: NO pain today, no recent falls, doing ok with home exercises per spouse    EVAL: 19 y/o patient referred from Dr Norleen for gait disorder.   Patient has recurrent UTI's and spouse states she gets wild and unsteady when she gets a UTI.   In fact she is on PO ABX right now for a new UTI dx'd 2 days ago at urgent care.   Spouse reports that once the UTI clears she is ok with ambulation; or at least as ok as possible for her .   Patient has dementia with constant disorientation and confusion.    She is pleasantly confused throughout today's visit and all history is obtained from spouse.   He provides 24/7 care for her and manages all of her meds, meals, ADLs and transportation.   He reports she is usually very pleasant and cooperative and that she has no recent falls.  He states she does not wander unless she gets a UTI.   He reports the home is handicapped accessible with shower chair, multple grab bars, removable shower head, etc.   Spouse is not exactly sure what necessitated the PT referral unless patient was unsteady and had a UTI at her last office visit.    Spouse does not feel she has declined  in function per se.   He provides hand held assistance for safety and direction whenever they are  in the community.   HE does not allow her walk unassisted out of the home at any time.  Spouse reports they have an upstairs at home, but the patient does not have to go upstairs for any reason.   He has an alarm on their stairs so that the patient cannot get on them.   He states the patient fell several years ago and had a L hip fx, but nothing since that time.  He feels just a few PT visits will be enough.  Pt accompanied by: spouse  PAIN: Are you having pain? Yes: NPRS scale: denies any pain Pain location: no pain Pain description: no pain Aggravating factors: no pain Relieving factors: same  PERTINENT HISTORY:  Anemia, asthma, bipolar d/o, dementia, HTN, osteopenia, chronic cystitis w/ recurrent UTI's  PRECAUTIONS: Fall  RED FLAGS: None  WEIGHT BEARING RESTRICTIONS: No  FALLS:  Has patient fallen in last 6 months? No  LIVING ENVIRONMENT: Lives with: lives with their spouse Lives in: House/apartment Stairs: Yes: External: 1 steps; none Has following equipment at home: None  OCCUPATION: retired [employer]  PLOF: supervision for all mobility  PATIENT GOALS: patient cannot voice a goal   OBJECTIVE: (objective measures completed at initial evaluation unless otherwise dated)  DIAGNOSTIC FINDINGS:  N/A  COGNITION: Overall cognitive status: dementia and at baseline   SENSATION: WFL  COORDINATION: No issues noted  EDEMA:  None noted  MUSCLE TONE: WNL  DTRs:  NT  POSTURE:  Mild thoracic kyphosis and forward head posture    LOWER EXTREMITY ROM:     Active  Right eval Left eval  Hip flexion    Hip extension    Hip abduction    Hip adduction    Hip internal rotation    Hip external rotation    Knee flexion    Knee extension    Ankle dorsiflexion    Ankle plantarflexion    Ankle inversion    Ankle eversion     (Blank rows = not tested)  LOWER EXTREMITY MMT:  Patient has difficulty following instructions for formal MMT, but is able to partly  complete  MMT Right eval Left eval  Hip flexion 4+ 4+  Hip extension    Hip abduction    Hip adduction    Hip internal rotation    Hip external rotation    Knee flexion    Knee extension 5 5  Ankle dorsiflexion    Ankle plantarflexion    Ankle inversion    Ankle eversion    (Blank rows = not tested)  BED MOBILITY:  Sit to supine SBA Supine to sit SBA  TRANSFERS: Assistive device utilized: None  Sit to stand: SBA Stand to sit: SBA Chair to chair: SBA Floor: NT  GAIT: Distance walked: into clinic 200' and in clinic x 100' Assistive device utilized: holds spouse's hand Level of assistance: SBA Gait pattern: decreased step length- Right and decreased step length- Left Comments: short steps with minimal heel strike BLE   FUNCTIONAL TESTS:  patient cannot complete formal balance tests due to unable to completely follow all commands or multi step instructions Gait speed = 1.35 ft/sec  5X STS = 22 sec    PATIENT SURVEYS:  ABC scale: The Activities-Specific Balance Confidence (ABC) Scale 0% 10 20 30  40 50 60 70 80 90 100% No confidence<->completely confident  "How confident are you that  you will not lose your balance or become unsteady when you . . .   Date tested 10/26/2024  Walk around the house 50%  2. Walk up or down stairs 10%  3. Bend over and pick up a slipper from in front of a closet floor 5%  4. Reach for a small can off a shelf at eye level 10%  5. Stand on tip toes and reach for something above your head 0%  6. Stand on a chair and reach for something 0%  7. Sweep the floor 0%  8. Walk outside the house to a car parked in the driveway 20%  9. Get into or out of a car 5%  10. Walk across a parking lot to the mall 5%  11. Walk up or down a ramp 15%  12. Walk in a crowded mall where people rapidly walk past you 5%  13. Are bumped into by people as you walk through the mall 5%  14. Step onto or off of an escalator while you are holding onto the railing 0%   15. Step onto or off an escalator while holding onto parcels such that you cannot hold onto the railing 0%  16. Walk outside on icy sidewalks 0%  Total: #/16 130/1600      TODAY'S TREATMENT:  10/26/24 THERAPEUTIC EXERCISE: To improve strength and endurance.  Demonstration, verbal and tactile cues throughout for technique. NuStep L2 x 5'  NEUROMUSCULAR RE-EDUCATION: To improve balance, coordination, kinesthesia, and posture. Standing ball toss Seated ball kick Standing rocker board rocking F/B X 3';  S/S X 3' Resisted sidestepping RTB to knees w/ HHA x 50' x 2 Standing w/ HHA:  toe raise, march BLE, squats x 20 each Foam standing turning 360 x 2 each way, marching, toe raises x 10 ea Foam stand retrieving cups from cabinet and stacking them on the counter x 10' w/ PT assist for balance, attention to task, directions,  manual guiding   10/19/24 NuStep L1 x 6' w/ intermittent PT facilitation to keep moving arms and legs, keeping both hands on machine Sit to stand with therapist hand support x 10 Standing hip abduction- too much compensation Standing marching x 10 BLE Toe taps BLE x 10 9' stool Sidestepping with HHA 5x along mat table Color tapping different colors x 10 Seated LAQ BLE 2lb 2 x 10 Standing ball toss and catch fwd plane and lateral tosses   10/12/24 THERAPEUTIC EXERCISE: To improve strength and endurance.  Demonstration, verbal and tactile cues throughout for technique. NuStep L0 x 6' w/ intermittent PT facilitation to keep moving arms and legs, keeping both hands on machine  NEUROMUSCULAR RE-EDUCATION: To improve balance, coordination, kinesthesia, and proprioception. Standing ball toss w/ small ball x 2';  beach ball x 2' Seated ball toss small ball x 2' w/ lateral tosses for trunk rotation Standing beach ball kick x 2' Hand held toe raises x 2/10 BLE w/ PT mirroring Hand held minisquats x 2/10 BLE w/ PT mirroring Hand held marching x 2/10 BLE w/ PT  mirroring On square foam:  Hand held toe raises x 2/10 w/ PT mirroring  Hand held marching x 2/10 w/ PT mirroring  Small ball toss x 2' Sidestepping w/ hand held assist x 20' x 2 Braiding in front w/ hand held assist and mirroring x 20' x 2 Sidestepping on long foam x 4 laps back and forth with min assist/hand held Rechecked gait speed  10/06/24 Standing heel raises 2x10 Standing hip  abduction x 10 B Standing marching x 10 B Standing mini squat x 10 Forward steps over 1lb weight x 10; lateral steps x 10 Seated LAQ BLE x 12  Sit to stand 2 x 10 Sidesteps 1lb each leg along counter  09/28/24 SELF CARE: Provided education on PT POC progression and to promote safe home environment--recommended grab bar on door frame at entry to home; personal alarm PRN;   initial HEP   PATIENT EDUCATION:  Education details: HEP update  Person educated: Spouse Education method: Explanation, Demonstration, Verbal cues, Tactile cues, and Handouts Education comprehension: verbalized understanding, verbal cues required, tactile cues required, and needs further education  HOME EXERCISE PROGRAM: Access Code: [email or web address] URL: [email or web address] Date: 10/06/2024 Prepared by: Braylin Clark  Exercises - Braided Sidestepping  - 1 x daily - 7 x weekly - 3 sets - 10 reps - Heel Raises with Counter Support  - 1 x daily - 7 x weekly - 3 sets - 10 reps - Heel Toe Raises with Counter Support  - 1 x daily - 7 x weekly - 3 sets - 10 reps - Mini Squat with Counter Support  - 1 x daily - 7 x weekly - 3 sets - 10 reps - Standing March with Counter Support  - 1 x daily - 7 x weekly - 3 sets - 10 reps - Seated Long Arc Quad  - 1 x daily - 7 x weekly - 2 sets - 10 reps - Seated March  - 1 x daily - 7 x weekly - 2 sets - 10 reps   ASSESSMENT:  CLINICAL IMPRESSION: Patient has been seen x 5 PT visits for weakness, difficulty walking following a UTI.   She has made good progress and is ready for D/C.   She will require ongoing 24 hour supervision due to her dementia, but she is ambulating very well with hand held assistance.   She will always be a fall risk due to the dementia, but her gait speed and 5X sit to stand tests today are much improved from her initial visit.    She is felt to be functioning at her maximum rehab potential at this time.   Spouse is advised to continue to have patient ambulate for distance every day until she is a little fatigued to maintain her function.   He is currently doing this and will continue to do so.   They are to call us  with any further questions.  D/C PT.  OBJECTIVE IMPAIRMENTS: difficulty walking.   ACTIVITY LIMITATIONS: carrying, lifting, squatting, stairs, and locomotion level  PARTICIPATION LIMITATIONS: community activity  PERSONAL FACTORS: Age, Fitness, Time since onset of injury/illness/exacerbation, and 1-2 comorbidities: Anemia, asthma, bipolar d/o, dementia, HTN, osteopenia, chronic cystitis w/ recurrent UTI's are also affecting patient's functional outcome.   REHAB POTENTIAL: Good  CLINICAL DECISION MAKING: Evolving/moderate complexity  EVALUATION COMPLEXITY: Moderate   GOALS: Goals reviewed with patient? Yes  SHORT TERM GOALS: Target date: 10/12/2024   Patient will be independent with initial HEP to improve outcomes and carryover.  Baseline: 100% PT assist required for correct completion 10/12/24:  spouse can teach back HEP Goal status: MET  2.  Patient will be educated on strategies to decrease risk of falls.  Baseline: no education yet Goal status: MET  3.  Patient will demonstrate increased gait speed to >/=  1.75 ft/sec to decrease risk for falls with transitional mobility. Baseline: 1.35 ft/sec 10/12/24:  1.53 ft/sec Goal status: MET  LONG TERM GOALS: Target  date: 10/26/2024   Patient will be independent with advanced/ongoing HEP to facilitate ability to maintain/progress functional gains from skilled physical therapy  services. Baseline: no advanced HEP yet Goal status: MET   4.  Patient will demonstrate improved BLE strength to >/= 5/5 for improved stability and ease of mobility . Baseline: Refer to above LE MMT table Goal status: MET  5.  Patient will improve 5xSTS time to </= 16 seconds for improved efficiency and safety with transfers. Baseline: 22.30 sec 10/26/24:  13.88 sec Goal status: MET   6.  Patient will demonstrate gait speed of >/= 2.00 ft/sec (0.55 m/s) to be a safe limited community ambulator with decreased risk for recurrent falls.  Baseline: 1.35 ft/sec 10/12/24:  1.53 ft/sec 10/26/24:  2.24 ft/sec w/ HHA of PT Goal status: MET  10.  Patient will report >/= 27% on ABC scale (MCID = 19%) to demonstrate improved balance confidence with functional mobility and gait. Baseline: 8% Goal status: INITIAL   PLAN:  PT FREQUENCY: 1-2x/week  PT DURATION: 8 weeks  PLANNED INTERVENTIONS: 97164- PT Re-evaluation, 97750- Physical Performance Testing, 97110-Therapeutic exercises, 97530- Therapeutic activity, 97112- Neuromuscular re-education, 97535- Self Care, [id number]- Manual therapy, [phone]- Gait training, Patient/Family education, Balance training, Stair training, and Taping  PLAN FOR NEXT SESSION: DC PT  PHYSICAL THERAPY DISCHARGE SUMMARY  Visits from Start of Care: 5  Current functional level related to goals / functional outcomes: SEE ABOVE NOTE IN ASSESSMENT SECTION   Remaining deficits: Confusion w/ constant disorientation requiring need for constant line of sight 24 hour supervision   Education / Equipment: Patient/spouse are  independent with all home exercises and advised to continue daily as tolerated and call us  with any questions    Patient agrees to discharge. Patient goals were met. Patient is being discharged due to maximized rehab potential.    Tarren Sabree, PT 10/26/2024, 2:42 PM

## 2024-12-16 ENCOUNTER — Telehealth: Payer: Self-pay | Admitting: Internal Medicine

## 2024-12-16 NOTE — Telephone Encounter (Signed)
 Copied from CRM [id number]. Topic: Appointments - Scheduling Inquiry for Clinic >> Dec 16, 2024  3:57 PM Eva FALCON wrote: Reason for CRM: Pt husband Elza states Dr. Ubaldo agreed to take his wife as a new patient. Since essentially she is not taking new patients unable to schedule. Please call Madeline Mcdaniel [phone] to set up that appointment.  Can you confirm - ok to schedule? -L.S

## 2025-01-08 NOTE — Telephone Encounter (Signed)
 Copied from CRM #[id number]. Topic: Appointments - Scheduling Inquiry for Clinic >> Jan 07, 2025  3:35 PM China J wrote: Reason for CRM: The patient's husband is calling to see if Dr. Watt would be able to accept the patient as a new patient since he is already established with her. He said that they have spoke in person about this accomodation.   Madeline Mcdaniel is scheduled for May 6th at 1:00 PM and was wondering if Dr. Watt could see them at the same time. If not, that's okay too, he just wanted to see if being seen together was an option.  Please call Madeline Mcdaniel at [phone]. >> Jan 08, 2025  1:36 PM Rosaria A wrote: Routing to correct office.

## 2025-01-08 NOTE — Telephone Encounter (Signed)
 Pt is scheduled

## 2025-02-18 ENCOUNTER — Ambulatory Visit: Admitting: Internal Medicine

## 2025-04-07 ENCOUNTER — Ambulatory Visit: Admitting: Family Medicine

## 2025-05-18 ENCOUNTER — Ambulatory Visit
